# Patient Record
Sex: Female | Born: 1940
Health system: Southern US, Community
[De-identification: ages and names within clinical notes are randomized; demographics above are authoritative.]

## PROBLEM LIST (undated history)

## (undated) DIAGNOSIS — T8859XA Other complications of anesthesia, initial encounter: Secondary | ICD-10-CM

## (undated) DIAGNOSIS — R011 Cardiac murmur, unspecified: Secondary | ICD-10-CM

## (undated) DIAGNOSIS — T4145XA Adverse effect of unspecified anesthetic, initial encounter: Secondary | ICD-10-CM

## (undated) DIAGNOSIS — R112 Nausea with vomiting, unspecified: Secondary | ICD-10-CM

## (undated) DIAGNOSIS — E119 Type 2 diabetes mellitus without complications: Secondary | ICD-10-CM

## (undated) DIAGNOSIS — Z9889 Other specified postprocedural states: Secondary | ICD-10-CM

## (undated) DIAGNOSIS — M1990 Unspecified osteoarthritis, unspecified site: Secondary | ICD-10-CM

## (undated) DIAGNOSIS — I10 Essential (primary) hypertension: Secondary | ICD-10-CM

## (undated) HISTORY — DX: Cardiac murmur, unspecified: R01.1

## (undated) HISTORY — PX: REPLACEMENT TOTAL KNEE BILATERAL: SUR1225

## (undated) HISTORY — DX: Type 2 diabetes mellitus without complications: E11.9

## (undated) HISTORY — DX: Essential (primary) hypertension: I10

## (undated) HISTORY — PX: APPENDECTOMY: SHX54

## (undated) HISTORY — DX: Unspecified osteoarthritis, unspecified site: M19.90

---

## 1970-01-20 HISTORY — PX: BREAST CYST EXCISION: SHX579

## 1970-01-20 HISTORY — PX: ABDOMINAL HYSTERECTOMY: SHX81

## 1990-10-19 DIAGNOSIS — N6019 Diffuse cystic mastopathy of unspecified breast: Secondary | ICD-10-CM | POA: Insufficient documentation

## 1990-10-19 DIAGNOSIS — I10 Essential (primary) hypertension: Secondary | ICD-10-CM | POA: Insufficient documentation

## 1994-12-09 DIAGNOSIS — T7840XA Allergy, unspecified, initial encounter: Secondary | ICD-10-CM | POA: Insufficient documentation

## 2005-01-20 HISTORY — PX: COLONOSCOPY: SHX174

## 2005-09-18 DIAGNOSIS — E739 Lactose intolerance, unspecified: Secondary | ICD-10-CM | POA: Insufficient documentation

## 2005-10-23 ENCOUNTER — Ambulatory Visit: Payer: Self-pay | Admitting: Family Medicine

## 2005-10-28 ENCOUNTER — Ambulatory Visit: Payer: Self-pay

## 2005-11-18 ENCOUNTER — Ambulatory Visit: Payer: Self-pay | Admitting: General Surgery

## 2005-11-20 ENCOUNTER — Ambulatory Visit: Payer: Self-pay | Admitting: Family Medicine

## 2006-11-26 ENCOUNTER — Ambulatory Visit: Payer: Self-pay | Admitting: Family Medicine

## 2007-10-28 ENCOUNTER — Ambulatory Visit: Payer: Self-pay | Admitting: Family Medicine

## 2007-12-27 DIAGNOSIS — E559 Vitamin D deficiency, unspecified: Secondary | ICD-10-CM | POA: Insufficient documentation

## 2007-12-27 DIAGNOSIS — E119 Type 2 diabetes mellitus without complications: Secondary | ICD-10-CM | POA: Insufficient documentation

## 2008-10-13 ENCOUNTER — Ambulatory Visit: Payer: Self-pay | Admitting: Family Medicine

## 2009-06-09 ENCOUNTER — Emergency Department: Payer: Self-pay | Admitting: Unknown Physician Specialty

## 2009-06-25 ENCOUNTER — Ambulatory Visit: Payer: Self-pay | Admitting: Orthopedic Surgery

## 2010-05-22 LAB — HM DEXA SCAN: HM Dexa Scan: NORMAL

## 2010-05-23 ENCOUNTER — Ambulatory Visit: Payer: Self-pay | Admitting: Family Medicine

## 2011-06-25 ENCOUNTER — Ambulatory Visit: Payer: Self-pay | Admitting: Family Medicine

## 2012-09-06 ENCOUNTER — Ambulatory Visit: Payer: Self-pay | Admitting: Family Medicine

## 2013-01-20 DIAGNOSIS — E119 Type 2 diabetes mellitus without complications: Secondary | ICD-10-CM

## 2013-01-20 HISTORY — DX: Type 2 diabetes mellitus without complications: E11.9

## 2013-10-03 ENCOUNTER — Ambulatory Visit: Payer: Self-pay | Admitting: Family Medicine

## 2014-01-25 DIAGNOSIS — Z23 Encounter for immunization: Secondary | ICD-10-CM | POA: Diagnosis not present

## 2014-01-25 DIAGNOSIS — N644 Mastodynia: Secondary | ICD-10-CM | POA: Diagnosis not present

## 2014-01-31 ENCOUNTER — Ambulatory Visit (INDEPENDENT_AMBULATORY_CARE_PROVIDER_SITE_OTHER): Payer: Medicare Other | Admitting: General Surgery

## 2014-01-31 ENCOUNTER — Encounter: Payer: Self-pay | Admitting: General Surgery

## 2014-01-31 VITALS — BP 138/70 | HR 92 | Resp 14 | Ht 61.0 in | Wt 180.0 lb

## 2014-01-31 DIAGNOSIS — N644 Mastodynia: Secondary | ICD-10-CM | POA: Diagnosis not present

## 2014-01-31 NOTE — Progress Notes (Addendum)
Patient ID: Cynthia Castillo, female   DOB: Jan 02, 1941, 74 y.o.   MRN: [id number]  Chief Complaint  Patient presents with  . Breast Problem    HPI Cynthia Castillo is a 74 y.o. female.  who presents for a breast evaluation. The most recent mammogram was done on 10-03-13.  Patient does perform regular self breast checks and gets regular mammograms done.  She states that since the mammogram she has developed an intermittent dull pain in the right lateral breast since the day of the mammogram. It occurs mostly during the day. She has not been awakened from sleep with discomfort.  She states it does come and goes, usually lasting about 1-2 minutes. She has not modified her daily activities because of the pain. She has not made use of any analgesics.  Denies any trauma or injury.  The patient was concerned with the breast pain as an older sister had breast cancer in 2006.  Diagnosed with diabetes, diet controlled, last year and her FSBS's range 130-140.  HPI  Past Medical History  Diagnosis Date  . Hypertension   . Arthritis     hands  . Murmur   . Diabetes mellitus without complication 1749    Past Surgical History  Procedure Laterality Date  . Breast cyst excision Left 1972  . Abdominal hysterectomy  1972  . Colonoscopy  2007    Dr Bary Castilla    Family History  Problem Relation Age of Onset  . Stroke Mother   . Heart attack Father   . Cancer Sister 80    stomach/Cynthia Castillo  . Cancer Sister 17    breast/Cynthia Castillo  . Cancer Maternal Grandmother     unknown    Social History History  Substance Use Topics  . Smoking status: Never Smoker   . Smokeless tobacco: Never Used  . Alcohol Use: No    Allergies  Allergen Reactions  . Latex Anaphylaxis    Current Outpatient Prescriptions  Medication Sig Dispense Refill  . Multiple Vitamin (MULTIVITAMIN) capsule Take 1 capsule by mouth daily.    . Olmesartan-Amlodipine-HCTZ (TRIBENZOR) 40-10-25 MG TABS Take by mouth daily.    .  traMADol (ULTRAM) 50 MG tablet Take by mouth every 6 (six) hours as needed.     No current facility-administered medications for this visit.    Review of Systems Review of Systems  Constitutional: Negative.   Respiratory: Negative.   Cardiovascular: Negative.     Blood pressure 138/70, pulse 92, resp. rate 14, height 5\' 1"  (1.549 m), weight 180 lb (81.647 kg).  Physical Exam Physical Exam  Constitutional: She is oriented to person, place, and time.  Neck: Neck supple.  Cardiovascular: Normal rate and regular rhythm.   Murmur heard.  Systolic murmur is present with a grade of 2/6  Pulmonary/Chest: Effort normal and breath sounds normal. Right breast exhibits no inverted nipple, no mass, no nipple discharge, no skin change and no tenderness. Left breast exhibits no inverted nipple, no mass, no nipple discharge, no skin change and no tenderness.    Lymphadenopathy:    She has no cervical adenopathy.    She has no axillary adenopathy.  Neurological: She is alert and oriented to person, place, and time.  Skin: Skin is warm and dry.    Data Reviewed Screening mammograms dated 10/03/2013 were reviewed. Scant residual breast parenchyma. Marland Kitchen BI-RADS-1.  Assessment    Benign breast exam, mastalgia status post mammogram.     Plan    At this time  there is no indication for intervention. The transient nature of her discomfort would speak against the need for any anti-inflammatory therapy. My hope would be as these symptoms would resolve as time progresses.  The patient was reassured that it is rare for breast cancer to present with pain, especially one so intermittent in nature.  The patient had her last screening colonoscopy in 2007 and would be a candidate for a repeat exam neck year. She'll be contacted at the appropriate time.      Follow up in 2017 with screening colonoscopy.   PCP/Ref: Philemon Kingdom 02/01/2014, 6:56 AM

## 2014-01-31 NOTE — Patient Instructions (Signed)
The patient is aware to call back for any questions or concerns.  

## 2014-02-01 DIAGNOSIS — N644 Mastodynia: Secondary | ICD-10-CM | POA: Insufficient documentation

## 2014-04-18 DIAGNOSIS — Z0000 Encounter for general adult medical examination without abnormal findings: Secondary | ICD-10-CM | POA: Diagnosis not present

## 2014-04-18 DIAGNOSIS — Z23 Encounter for immunization: Secondary | ICD-10-CM | POA: Diagnosis not present

## 2014-05-19 DIAGNOSIS — M170 Bilateral primary osteoarthritis of knee: Secondary | ICD-10-CM | POA: Diagnosis not present

## 2014-05-23 DIAGNOSIS — E119 Type 2 diabetes mellitus without complications: Secondary | ICD-10-CM | POA: Diagnosis not present

## 2014-05-23 DIAGNOSIS — E785 Hyperlipidemia, unspecified: Secondary | ICD-10-CM | POA: Diagnosis not present

## 2014-05-23 DIAGNOSIS — I10 Essential (primary) hypertension: Secondary | ICD-10-CM | POA: Diagnosis not present

## 2014-05-23 DIAGNOSIS — M1990 Unspecified osteoarthritis, unspecified site: Secondary | ICD-10-CM | POA: Diagnosis not present

## 2014-05-23 DIAGNOSIS — R5383 Other fatigue: Secondary | ICD-10-CM | POA: Diagnosis not present

## 2014-05-23 LAB — HEMOGLOBIN A1C: HEMOGLOBIN A1C: 6.9 % — AB (ref 4.0–6.0)

## 2014-05-26 ENCOUNTER — Other Ambulatory Visit: Payer: Self-pay | Admitting: Orthopedic Surgery

## 2014-05-26 DIAGNOSIS — E780 Pure hypercholesterolemia: Secondary | ICD-10-CM | POA: Diagnosis not present

## 2014-05-26 DIAGNOSIS — I10 Essential (primary) hypertension: Secondary | ICD-10-CM | POA: Diagnosis not present

## 2014-05-26 DIAGNOSIS — M170 Bilateral primary osteoarthritis of knee: Secondary | ICD-10-CM

## 2014-05-26 LAB — BASIC METABOLIC PANEL
Creatinine: 0.9 mg/dL (ref 0.5–1.1)
Glucose: 137 mg/dL
Sodium: 142 mmol/L (ref 137–147)

## 2014-05-26 LAB — LIPID PANEL
CHOLESTEROL: 183 mg/dL (ref 0–200)
HDL: 60 mg/dL (ref 35–70)
LDL Cholesterol: 102 mg/dL
LDl/HDL Ratio: 1.7
TRIGLYCERIDES: 103 mg/dL (ref 40–160)

## 2014-05-26 LAB — HEPATIC FUNCTION PANEL
ALT: 14 U/L (ref 7–35)
AST: 16 U/L (ref 13–35)
Alkaline Phosphatase: 62 U/L (ref 25–125)

## 2014-05-26 LAB — CBC AND DIFFERENTIAL
NEUTROS ABS: 4 /uL
WBC: 6.1 10^3/mL

## 2014-05-26 LAB — TSH: TSH: 2.56 u[IU]/mL (ref 0.41–5.90)

## 2014-06-02 ENCOUNTER — Ambulatory Visit: Payer: Self-pay

## 2014-06-06 ENCOUNTER — Ambulatory Visit
Admission: RE | Admit: 2014-06-06 | Discharge: 2014-06-06 | Disposition: A | Discharge status: Home / Self Care | Payer: Medicare Other | Source: Ambulatory Visit | Attending: Orthopedic Surgery | Admitting: Orthopedic Surgery

## 2014-06-06 DIAGNOSIS — M179 Osteoarthritis of knee, unspecified: Secondary | ICD-10-CM | POA: Diagnosis not present

## 2014-06-06 DIAGNOSIS — Z01818 Encounter for other preprocedural examination: Secondary | ICD-10-CM | POA: Diagnosis not present

## 2014-06-06 DIAGNOSIS — M170 Bilateral primary osteoarthritis of knee: Secondary | ICD-10-CM | POA: Diagnosis present

## 2014-06-06 DIAGNOSIS — M1711 Unilateral primary osteoarthritis, right knee: Secondary | ICD-10-CM | POA: Diagnosis not present

## 2014-06-28 DIAGNOSIS — M170 Bilateral primary osteoarthritis of knee: Secondary | ICD-10-CM | POA: Diagnosis not present

## 2014-06-29 ENCOUNTER — Other Ambulatory Visit: Payer: Self-pay

## 2014-06-29 MED ORDER — OLMESARTAN-AMLODIPINE-HCTZ 40-10-25 MG PO TABS: 1.0000 | ORAL_TABLET | Freq: Every day | ORAL | Status: DC

## 2014-07-12 ENCOUNTER — Encounter
Admission: RE | Admit: 2014-07-12 | Discharge: 2014-07-12 | Disposition: A | Discharge status: Home / Self Care | Payer: Medicare Other | Source: Ambulatory Visit | Attending: Orthopedic Surgery | Admitting: Orthopedic Surgery

## 2014-07-12 DIAGNOSIS — E785 Hyperlipidemia, unspecified: Secondary | ICD-10-CM | POA: Insufficient documentation

## 2014-07-12 DIAGNOSIS — E119 Type 2 diabetes mellitus without complications: Secondary | ICD-10-CM | POA: Insufficient documentation

## 2014-07-12 DIAGNOSIS — E039 Hypothyroidism, unspecified: Secondary | ICD-10-CM | POA: Diagnosis not present

## 2014-07-12 DIAGNOSIS — Z91040 Latex allergy status: Secondary | ICD-10-CM | POA: Diagnosis not present

## 2014-07-12 DIAGNOSIS — E1140 Type 2 diabetes mellitus with diabetic neuropathy, unspecified: Secondary | ICD-10-CM | POA: Diagnosis not present

## 2014-07-12 DIAGNOSIS — Z01810 Encounter for preprocedural cardiovascular examination: Secondary | ICD-10-CM | POA: Diagnosis present

## 2014-07-12 DIAGNOSIS — I10 Essential (primary) hypertension: Secondary | ICD-10-CM | POA: Insufficient documentation

## 2014-07-12 DIAGNOSIS — M1711 Unilateral primary osteoarthritis, right knee: Secondary | ICD-10-CM | POA: Diagnosis present

## 2014-07-12 DIAGNOSIS — Z01812 Encounter for preprocedural laboratory examination: Secondary | ICD-10-CM | POA: Diagnosis present

## 2014-07-12 HISTORY — DX: Nausea with vomiting, unspecified: R11.2

## 2014-07-12 HISTORY — DX: Adverse effect of unspecified anesthetic, initial encounter: T41.45XA

## 2014-07-12 HISTORY — DX: Other specified postprocedural states: Z98.890

## 2014-07-12 HISTORY — DX: Other complications of anesthesia, initial encounter: T88.59XA

## 2014-07-12 LAB — TYPE AND SCREEN
ABO/RH(D): A POS
Antibody Screen: NEGATIVE

## 2014-07-12 LAB — CBC
HCT: 40.2 % (ref 35.0–47.0)
Hemoglobin: 12.8 g/dL (ref 12.0–16.0)
MCH: 25.2 pg — AB (ref 26.0–34.0)
MCHC: 31.7 g/dL — AB (ref 32.0–36.0)
MCV: 79.6 fL — ABNORMAL LOW (ref 80.0–100.0)
Platelets: 382 10*3/uL (ref 150–440)
RBC: 5.05 MIL/uL (ref 3.80–5.20)
RDW: 14.3 % (ref 11.5–14.5)
WBC: 6.4 10*3/uL (ref 3.6–11.0)

## 2014-07-12 LAB — BASIC METABOLIC PANEL
ANION GAP: 9 (ref 5–15)
BUN: 12 mg/dL (ref 6–20)
CO2: 27 mmol/L (ref 22–32)
Calcium: 10 mg/dL (ref 8.9–10.3)
Chloride: 105 mmol/L (ref 101–111)
Creatinine, Ser: 0.93 mg/dL (ref 0.44–1.00)
GFR calc Af Amer: >60 mL/min (ref >60)
GFR calc non Af Amer: 60 mL/min — ABNORMAL LOW (ref >60)
Glucose, Bld: 132 mg/dL — ABNORMAL HIGH (ref 65–99)
Potassium: 3.5 mmol/L (ref 3.5–5.1)
Sodium: 141 mmol/L (ref 135–145)

## 2014-07-12 LAB — URINALYSIS COMPLETE WITH MICROSCOPIC (ARMC ONLY)
Bilirubin Urine: NEGATIVE
Glucose, UA: NEGATIVE mg/dL
HGB URINE DIPSTICK: NEGATIVE
Ketones, ur: NEGATIVE mg/dL
Nitrite: NEGATIVE
PH: 5 (ref 5.0–8.0)
PROTEIN: NEGATIVE mg/dL
Specific Gravity, Urine: 1.013 (ref 1.005–1.030)

## 2014-07-12 LAB — SURGICAL PCR SCREEN
MRSA, PCR: NEGATIVE
STAPHYLOCOCCUS AUREUS: NEGATIVE

## 2014-07-12 LAB — PROTIME-INR
INR: 0.96
PROTHROMBIN TIME: 13 s (ref 11.4–15.0)

## 2014-07-12 LAB — APTT: aPTT: 29 seconds (ref 24–36)

## 2014-07-12 LAB — SEDIMENTATION RATE: SED RATE: 24 mm/h (ref 0–30)

## 2014-07-12 LAB — ABO/RH: ABO/RH(D): A POS

## 2014-07-12 NOTE — Patient Instructions (Signed)
  Your procedure is scheduled on:07/25/14 Report to Day Surgery. To find out your arrival time please call [phone] between 1PM - 3PM on 07/22/14.  Remember: Instructions that are not followed completely may result in serious medical risk, up to and including death, or upon the discretion of your surgeon and anesthesiologist your surgery may need to be rescheduled.    __x__ 1. Do not eat food or drink liquids after midnight. No gum chewing or hard candies.     __x__ 2. No Alcohol for 24 hours before or after surgery.   ____ 3. Bring all medications with you on the day of surgery if instructed.    ___x_ 4. Notify your doctor if there is any change in your medical condition     (cold, fever, infections).     Do not wear jewelry, make-up, hairpins, clips or nail polish.  Do not wear lotions, powders, or perfumes. You may wear deodorant.  Do not shave 48 hours prior to surgery. Men may shave face and neck.  Do not bring valuables to the hospital.    [facility] is not responsible for any belongings or valuables.               Contacts, dentures or bridgework may not be worn into surgery.  Leave your suitcase in the car. After surgery it may be brought to your room.  For patients admitted to the hospital, discharge time is determined by your                treatment team.   Patients discharged the day of surgery will not be allowed to drive home.   Please read over the following fact sheets that you were given:   Surgical Site Infection Prevention   ____ Take these medicines the morning of surgery with A SIP OF WATER:    1.   2.   3.   4.  5.  6.  ____ Fleet Enema (as directed)   _x___ Use CHG Soap as directed  ____ Use inhalers on the day of surgery  ____ Stop metformin 2 days prior to surgery    ____ Take 1/2 of usual insulin dose the night before surgery and none on the morning of surgery.   ____ Stop Coumadin/Plavix/aspirin on  ____ Stop Anti-inflammatories on     ____ Stop supplements until after surgery.    ____ Bring C-Pap to the hospital.

## 2014-07-25 ENCOUNTER — Inpatient Hospital Stay
Admission: RE | Admit: 2014-07-25 | Discharge: 2014-07-28 | DRG: 470 | Disposition: A | Discharge status: Skilled Nursing Facility | Payer: Medicare Other | Source: Ambulatory Visit | Attending: Orthopedic Surgery | Admitting: Orthopedic Surgery

## 2014-07-25 ENCOUNTER — Encounter
Admission: RE | Disposition: A | Discharge status: Skilled Nursing Facility | Payer: Self-pay | Source: Ambulatory Visit | Attending: Orthopedic Surgery

## 2014-07-25 ENCOUNTER — Inpatient Hospital Stay: Payer: Medicare Other | Admitting: Anesthesiology

## 2014-07-25 ENCOUNTER — Encounter: Payer: Self-pay | Admitting: *Deleted

## 2014-07-25 ENCOUNTER — Inpatient Hospital Stay: Payer: Medicare Other

## 2014-07-25 DIAGNOSIS — M158 Other polyosteoarthritis: Secondary | ICD-10-CM | POA: Diagnosis not present

## 2014-07-25 DIAGNOSIS — Z6834 Body mass index (BMI) 34.0-34.9, adult: Secondary | ICD-10-CM

## 2014-07-25 DIAGNOSIS — E559 Vitamin D deficiency, unspecified: Secondary | ICD-10-CM | POA: Diagnosis not present

## 2014-07-25 DIAGNOSIS — M6281 Muscle weakness (generalized): Secondary | ICD-10-CM | POA: Diagnosis not present

## 2014-07-25 DIAGNOSIS — R262 Difficulty in walking, not elsewhere classified: Secondary | ICD-10-CM | POA: Diagnosis not present

## 2014-07-25 DIAGNOSIS — R6889 Other general symptoms and signs: Secondary | ICD-10-CM | POA: Diagnosis not present

## 2014-07-25 DIAGNOSIS — Z96651 Presence of right artificial knee joint: Secondary | ICD-10-CM | POA: Diagnosis not present

## 2014-07-25 DIAGNOSIS — M1711 Unilateral primary osteoarthritis, right knee: Secondary | ICD-10-CM | POA: Diagnosis not present

## 2014-07-25 DIAGNOSIS — D62 Acute posthemorrhagic anemia: Secondary | ICD-10-CM | POA: Diagnosis not present

## 2014-07-25 DIAGNOSIS — I10 Essential (primary) hypertension: Secondary | ICD-10-CM | POA: Diagnosis present

## 2014-07-25 DIAGNOSIS — M179 Osteoarthritis of knee, unspecified: Secondary | ICD-10-CM | POA: Diagnosis not present

## 2014-07-25 DIAGNOSIS — E119 Type 2 diabetes mellitus without complications: Secondary | ICD-10-CM | POA: Diagnosis present

## 2014-07-25 DIAGNOSIS — E785 Hyperlipidemia, unspecified: Secondary | ICD-10-CM | POA: Diagnosis not present

## 2014-07-25 DIAGNOSIS — E039 Hypothyroidism, unspecified: Secondary | ICD-10-CM | POA: Diagnosis not present

## 2014-07-25 DIAGNOSIS — G8918 Other acute postprocedural pain: Secondary | ICD-10-CM

## 2014-07-25 DIAGNOSIS — M1710 Unilateral primary osteoarthritis, unspecified knee: Principal | ICD-10-CM | POA: Diagnosis present

## 2014-07-25 DIAGNOSIS — J3089 Other allergic rhinitis: Secondary | ICD-10-CM | POA: Diagnosis not present

## 2014-07-25 DIAGNOSIS — E1143 Type 2 diabetes mellitus with diabetic autonomic (poly)neuropathy: Secondary | ICD-10-CM | POA: Diagnosis not present

## 2014-07-25 DIAGNOSIS — M1990 Unspecified osteoarthritis, unspecified site: Secondary | ICD-10-CM | POA: Diagnosis not present

## 2014-07-25 HISTORY — PX: TOTAL KNEE ARTHROPLASTY: SHX125

## 2014-07-25 LAB — CBC
HCT: 35.5 % (ref 35.0–47.0)
Hemoglobin: 11.3 g/dL — ABNORMAL LOW (ref 12.0–16.0)
MCH: 25.2 pg — ABNORMAL LOW (ref 26.0–34.0)
MCHC: 31.8 g/dL — AB (ref 32.0–36.0)
MCV: 79 fL — AB (ref 80.0–100.0)
Platelets: 336 10*3/uL (ref 150–440)
RBC: 4.49 MIL/uL (ref 3.80–5.20)
RDW: 14 % (ref 11.5–14.5)
WBC: 9.2 10*3/uL (ref 3.6–11.0)

## 2014-07-25 LAB — GLUCOSE, CAPILLARY
GLUCOSE-CAPILLARY: 209 mg/dL — AB (ref 65–99)
Glucose-Capillary: 156 mg/dL — ABNORMAL HIGH (ref 65–99)

## 2014-07-25 LAB — CREATININE, SERUM
Creatinine, Ser: 0.89 mg/dL (ref 0.44–1.00)
GFR calc Af Amer: >60 mL/min (ref >60)
GFR calc non Af Amer: >60 mL/min (ref >60)

## 2014-07-25 SURGERY — ARTHROPLASTY, KNEE, TOTAL
Anesthesia: Spinal | Laterality: Right | Wound class: Clean

## 2014-07-25 MED ORDER — BISACODYL 10 MG RE SUPP
10.0000 mg | Freq: Every day | RECTAL | Status: DC | PRN
Start: 2014-07-25 — End: 2014-07-28
  Filled 2014-07-25: qty 1

## 2014-07-25 MED ORDER — CHLORHEXIDINE GLUCONATE 4 % EX LIQD: 60.0000 mL | Freq: Once | CUTANEOUS | Status: DC

## 2014-07-25 MED ORDER — AMLODIPINE BESYLATE 10 MG PO TABS
10.0000 mg | ORAL_TABLET | Freq: Every day | ORAL | Status: DC
  Administered 2014-07-25 – 2014-07-28 (×4): 10 mg via ORAL
  Filled 2014-07-25 (×4): qty 1

## 2014-07-25 MED ORDER — MULTIVITAMINS PO CAPS: 1.0000 | ORAL_CAPSULE | Freq: Every day | ORAL | Status: DC

## 2014-07-25 MED ORDER — CEFAZOLIN SODIUM-DEXTROSE 2-3 GM-% IV SOLR
2.0000 g | Freq: Four times a day (QID) | INTRAVENOUS | Status: AC
  Administered 2014-07-25 (×3): 2 g via INTRAVENOUS
  Filled 2014-07-25 (×3): qty 50

## 2014-07-25 MED ORDER — FENTANYL CITRATE (PF) 100 MCG/2ML IJ SOLN
INTRAMUSCULAR | Status: DC | PRN
  Administered 2014-07-25: .5 ug via INTRAVENOUS

## 2014-07-25 MED ORDER — METOCLOPRAMIDE HCL 5 MG/ML IJ SOLN: 5.0000 mg | Freq: Three times a day (TID) | INTRAMUSCULAR | Status: DC | PRN

## 2014-07-25 MED ORDER — SODIUM CHLORIDE 0.9 % IV SOLN
INTRAVENOUS | Status: DC | PRN
  Administered 2014-07-25: 60 mL

## 2014-07-25 MED ORDER — NEOMYCIN-POLYMYXIN B GU 40-200000 IR SOLN
Status: AC
  Filled 2014-07-25: qty 20

## 2014-07-25 MED ORDER — BUPIVACAINE-EPINEPHRINE (PF) 0.25% -1:200000 IJ SOLN
INTRAMUSCULAR | Status: DC | PRN
Start: 2014-07-25 — End: 2014-07-25
  Administered 2014-07-25: 30 mL

## 2014-07-25 MED ORDER — PROPOFOL 10 MG/ML IV BOLUS
INTRAVENOUS | Status: DC | PRN
  Administered 2014-07-25: 40 mg via INTRAVENOUS
  Administered 2014-07-25: 10 mg via INTRAVENOUS

## 2014-07-25 MED ORDER — METOCLOPRAMIDE HCL 10 MG PO TABS: 5.0000 mg | ORAL_TABLET | Freq: Three times a day (TID) | ORAL | Status: DC | PRN

## 2014-07-25 MED ORDER — MORPHINE SULFATE 10 MG/ML IJ SOLN
INTRAMUSCULAR | Status: AC
  Filled 2014-07-25: qty 1

## 2014-07-25 MED ORDER — HYDROCHLOROTHIAZIDE 25 MG PO TABS
25.0000 mg | ORAL_TABLET | Freq: Every day | ORAL | Status: DC
  Administered 2014-07-26 – 2014-07-28 (×3): 25 mg via ORAL
  Filled 2014-07-25 (×3): qty 1

## 2014-07-25 MED ORDER — CEFAZOLIN SODIUM-DEXTROSE 2-3 GM-% IV SOLR
2.0000 g | INTRAVENOUS | Status: AC
  Administered 2014-07-25: 2 g via INTRAVENOUS

## 2014-07-25 MED ORDER — PROPOFOL INFUSION 10 MG/ML OPTIME
INTRAVENOUS | Status: DC | PRN
  Administered 2014-07-25: 65 ug/kg/min via INTRAVENOUS

## 2014-07-25 MED ORDER — ONDANSETRON HCL 4 MG/2ML IJ SOLN
4.0000 mg | Freq: Four times a day (QID) | INTRAMUSCULAR | Status: DC | PRN
  Administered 2014-07-26: 4 mg via INTRAVENOUS
  Filled 2014-07-25: qty 2

## 2014-07-25 MED ORDER — ONDANSETRON HCL 4 MG PO TABS: 4.0000 mg | ORAL_TABLET | Freq: Four times a day (QID) | ORAL | Status: DC | PRN

## 2014-07-25 MED ORDER — NEOMYCIN-POLYMYXIN B GU 40-200000 IR SOLN
Status: DC | PRN
  Administered 2014-07-25: 16 mL

## 2014-07-25 MED ORDER — BUPIVACAINE-EPINEPHRINE (PF) 0.25% -1:200000 IJ SOLN
INTRAMUSCULAR | Status: AC
  Filled 2014-07-25: qty 30

## 2014-07-25 MED ORDER — OLMESARTAN-AMLODIPINE-HCTZ 40-10-25 MG PO TABS: 1.0000 | ORAL_TABLET | Freq: Every day | ORAL | Status: DC

## 2014-07-25 MED ORDER — ENOXAPARIN SODIUM 30 MG/0.3ML ~~LOC~~ SOLN
30.0000 mg | Freq: Two times a day (BID) | SUBCUTANEOUS | Status: DC
  Administered 2014-07-26 – 2014-07-28 (×5): 30 mg via SUBCUTANEOUS
  Filled 2014-07-25 (×5): qty 0.3

## 2014-07-25 MED ORDER — ADULT MULTIVITAMIN W/MINERALS CH
1.0000 | ORAL_TABLET | Freq: Every day | ORAL | Status: DC
  Administered 2014-07-26 – 2014-07-28 (×3): 1 via ORAL
  Filled 2014-07-25 (×3): qty 1

## 2014-07-25 MED ORDER — PHENOL 1.4 % MT LIQD: 1.0000 | OROMUCOSAL | Status: DC | PRN

## 2014-07-25 MED ORDER — OXYCODONE HCL 5 MG PO TABS
5.0000 mg | ORAL_TABLET | ORAL | Status: DC | PRN
  Administered 2014-07-25 (×2): 5 mg via ORAL
  Administered 2014-07-26 (×2): 10 mg via ORAL
  Administered 2014-07-26 – 2014-07-28 (×6): 5 mg via ORAL
  Filled 2014-07-25 (×4): qty 1
  Filled 2014-07-25: qty 2
  Filled 2014-07-25 (×2): qty 1
  Filled 2014-07-25: qty 2
  Filled 2014-07-25 (×2): qty 1

## 2014-07-25 MED ORDER — ONDANSETRON HCL 4 MG/2ML IJ SOLN
INTRAMUSCULAR | Status: DC | PRN
  Administered 2014-07-25 (×2): 4 mg via INTRAVENOUS

## 2014-07-25 MED ORDER — CEFAZOLIN SODIUM-DEXTROSE 2-3 GM-% IV SOLR
INTRAVENOUS | Status: AC
  Filled 2014-07-25: qty 50

## 2014-07-25 MED ORDER — SODIUM CHLORIDE 0.9 % IJ SOLN
INTRAMUSCULAR | Status: AC
  Filled 2014-07-25: qty 100

## 2014-07-25 MED ORDER — MORPHINE (PF) INJECTION FOR INHALATION 10 MG/ML
RESPIRATORY_TRACT | Status: DC | PRN
  Administered 2014-07-25: 10 mg via RESPIRATORY_TRACT

## 2014-07-25 MED ORDER — BUPIVACAINE LIPOSOME 1.3 % IJ SUSP
INTRAMUSCULAR | Status: AC
  Filled 2014-07-25: qty 20

## 2014-07-25 MED ORDER — PHENYLEPHRINE HCL 10 MG/ML IJ SOLN
INTRAMUSCULAR | Status: DC | PRN
  Administered 2014-07-25 (×2): 100 ug via INTRAVENOUS

## 2014-07-25 MED ORDER — MENTHOL 3 MG MT LOZG: 1.0000 | LOZENGE | OROMUCOSAL | Status: DC | PRN

## 2014-07-25 MED ORDER — ATROPINE ORAL SOLUTION 0.08 MG/ML: 0.0100 mg/kg | Freq: Once | ORAL | Status: DC | PRN

## 2014-07-25 MED ORDER — ALUM & MAG HYDROXIDE-SIMETH 200-200-20 MG/5ML PO SUSP
30.0000 mL | ORAL | Status: DC | PRN
Start: 2014-07-25 — End: 2014-07-28

## 2014-07-25 MED ORDER — MAGNESIUM CITRATE PO SOLN
1.0000 | Freq: Once | ORAL | Status: AC | PRN
  Filled 2014-07-25: qty 296

## 2014-07-25 MED ORDER — BUPIVACAINE HCL (PF) 0.5 % IJ SOLN
INTRAMUSCULAR | Status: DC | PRN
  Administered 2014-07-25: 3 mL

## 2014-07-25 MED ORDER — MORPHINE SULFATE 2 MG/ML IJ SOLN
2.0000 mg | INTRAMUSCULAR | Status: DC | PRN
  Administered 2014-07-25: 2 mg via INTRAVENOUS
  Filled 2014-07-25: qty 1

## 2014-07-25 MED ORDER — SODIUM CHLORIDE 0.9 % IV SOLN
INTRAVENOUS | Status: DC
  Administered 2014-07-25 (×3): via INTRAVENOUS

## 2014-07-25 MED ORDER — MIDAZOLAM HCL 5 MG/5ML IJ SOLN
INTRAMUSCULAR | Status: DC | PRN
  Administered 2014-07-25: 2 mg via INTRAVENOUS

## 2014-07-25 MED ORDER — ACETAMINOPHEN 650 MG RE SUPP: 650.0000 mg | Freq: Four times a day (QID) | RECTAL | Status: DC | PRN

## 2014-07-25 MED ORDER — ONDANSETRON HCL 4 MG/2ML IJ SOLN: 4.0000 mg | Freq: Once | INTRAMUSCULAR | Status: DC | PRN

## 2014-07-25 MED ORDER — SODIUM CHLORIDE 0.9 % IV SOLN
INTRAVENOUS | Status: DC
  Administered 2014-07-25 – 2014-07-26 (×2): via INTRAVENOUS

## 2014-07-25 MED ORDER — METHOCARBAMOL 1000 MG/10ML IJ SOLN: 500.0000 mg | Freq: Four times a day (QID) | INTRAVENOUS | Status: DC | PRN

## 2014-07-25 MED ORDER — TRANEXAMIC ACID 1000 MG/10ML IV SOLN
1000.0000 mg | INTRAVENOUS | Status: AC
  Administered 2014-07-25: 1000 mg via INTRAVENOUS
  Filled 2014-07-25: qty 10

## 2014-07-25 MED ORDER — FENTANYL CITRATE (PF) 100 MCG/2ML IJ SOLN: 25.0000 ug | INTRAMUSCULAR | Status: DC | PRN

## 2014-07-25 MED ORDER — METHOCARBAMOL 500 MG PO TABS
500.0000 mg | ORAL_TABLET | Freq: Four times a day (QID) | ORAL | Status: DC | PRN
Start: 2014-07-25 — End: 2014-07-28

## 2014-07-25 MED ORDER — ACETAMINOPHEN 325 MG PO TABS: 650.0000 mg | ORAL_TABLET | Freq: Four times a day (QID) | ORAL | Status: DC | PRN

## 2014-07-25 MED ORDER — DIPHENHYDRAMINE HCL 12.5 MG/5ML PO ELIX: 12.5000 mg | ORAL_SOLUTION | ORAL | Status: DC | PRN

## 2014-07-25 MED ORDER — MAGNESIUM HYDROXIDE 400 MG/5ML PO SUSP
30.0000 mL | Freq: Every day | ORAL | Status: DC | PRN
  Administered 2014-07-26 – 2014-07-27 (×2): 30 mL via ORAL
  Filled 2014-07-25 (×2): qty 30

## 2014-07-25 MED ORDER — ZOLPIDEM TARTRATE 5 MG PO TABS: 5.0000 mg | ORAL_TABLET | Freq: Every evening | ORAL | Status: DC | PRN

## 2014-07-25 MED ORDER — DOCUSATE SODIUM 100 MG PO CAPS
100.0000 mg | ORAL_CAPSULE | Freq: Two times a day (BID) | ORAL | Status: DC
  Administered 2014-07-25 – 2014-07-28 (×6): 100 mg via ORAL
  Filled 2014-07-25 (×8): qty 1

## 2014-07-25 MED ORDER — IRBESARTAN 150 MG PO TABS
300.0000 mg | ORAL_TABLET | Freq: Every day | ORAL | Status: DC
  Administered 2014-07-25 – 2014-07-28 (×4): 300 mg via ORAL
  Filled 2014-07-25 (×4): qty 2

## 2014-07-25 SURGICAL SUPPLY — 51 items
BANDAGE ELASTIC 6 CLIP ST LF (GAUZE/BANDAGES/DRESSINGS) ×2 IMPLANT
BLADE SAW 1 (BLADE) ×2 IMPLANT
BLOCK CUTTING FEMUR 2 RT MED (MISCELLANEOUS) IMPLANT
BLOCK CUTTING TIBIAL 2 RT (MISCELLANEOUS) IMPLANT
CANISTER SUCT 1200ML W/VALVE (MISCELLANEOUS) ×2 IMPLANT
CANISTER SUCT 3000ML (MISCELLANEOUS) ×4 IMPLANT
CAPT KNEE TOTAL 3 ×2 IMPLANT
CATH FOL LEG HOLDER (MISCELLANEOUS) ×2 IMPLANT
CATH TRAY 16F METER LATEX (MISCELLANEOUS) ×2 IMPLANT
CEMENT HV SMART SET (Cement) ×4 IMPLANT
CHLORAPREP W/TINT 26ML (MISCELLANEOUS) ×2 IMPLANT
COOLER POLAR GLACIER W/PUMP (MISCELLANEOUS) ×2 IMPLANT
DRAPE INCISE IOBAN 66X45 STRL (DRAPES) ×4 IMPLANT
DRAPE SHEET LG 3/4 BI-LAMINATE (DRAPES) ×4 IMPLANT
ELECT CAUTERY BLADE 6.4 (BLADE) ×2 IMPLANT
GAUZE PETRO XEROFOAM 1X8 (MISCELLANEOUS) ×2 IMPLANT
GAUZE SPONGE 4X4 12PLY STRL (GAUZE/BANDAGES/DRESSINGS) ×2 IMPLANT
GLOVE BIOGEL PI IND STRL 9 (GLOVE) ×1 IMPLANT
GLOVE BIOGEL PI INDICATOR 9 (GLOVE) ×1
GLOVE SURG ORTHO 9.0 STRL STRW (GLOVE) ×2 IMPLANT
GOWN SPECIALTY ULTRA XL (MISCELLANEOUS) ×2 IMPLANT
GOWN STRL REUS W/ TWL LRG LVL3 (GOWN DISPOSABLE) ×2 IMPLANT
GOWN STRL REUS W/TWL LRG LVL3 (GOWN DISPOSABLE) ×2
HANDPIECE SUCTION TUBG SURGILV (MISCELLANEOUS) ×2 IMPLANT
HOOD PEEL AWAY FACE SHEILD DIS (HOOD) ×4 IMPLANT
IMMBOLIZER KNEE 19 BLUE UNIV (SOFTGOODS) ×2 IMPLANT
IV SET EXTENSION 6 LL TADAPT (SET/KITS/TRAYS/PACK) ×2 IMPLANT
KNEE MEDACTA TIBIAL/FEMORAL BL (Knees) ×2 IMPLANT
KNIFE SCULPS 14X20 (INSTRUMENTS) ×2 IMPLANT
NDL SAFETY 18GX1.5 (NEEDLE) ×2 IMPLANT
NEEDLE SPNL 18GX3.5 QUINCKE PK (NEEDLE) ×2 IMPLANT
NEEDLE SPNL 20GX3.5 QUINCKE YW (NEEDLE) ×2 IMPLANT
NS IRRIG 1000ML POUR BTL (IV SOLUTION) ×2 IMPLANT
PACK TOTAL KNEE (MISCELLANEOUS) ×2 IMPLANT
PAD GROUND ADULT SPLIT (MISCELLANEOUS) ×2 IMPLANT
PAD WRAPON POLAR KNEE (MISCELLANEOUS) ×1 IMPLANT
SOL .9 NS 3000ML IRR  AL (IV SOLUTION) ×1
SOL .9 NS 3000ML IRR UROMATIC (IV SOLUTION) ×1 IMPLANT
STAPLER SKIN PROX 35W (STAPLE) ×2 IMPLANT
STEM EXTENSION 11MMX30MM (Stem) ×2 IMPLANT
STRAP SAFETY BODY (MISCELLANEOUS) ×2 IMPLANT
SUCTION FRAZIER TIP 10 FR DISP (SUCTIONS) ×2 IMPLANT
SUT DVC 2 QUILL PDO  T11 36X36 (SUTURE) ×1
SUT DVC 2 QUILL PDO T11 36X36 (SUTURE) ×1 IMPLANT
SUT DVC QUILL MONODERM 30X30 (SUTURE) ×2 IMPLANT
SUT ETHIBOND NAB CT1 #1 30IN (SUTURE) ×2 IMPLANT
SYR 20CC LL (SYRINGE) ×2 IMPLANT
SYR 50ML LL SCALE MARK (SYRINGE) ×2 IMPLANT
TOWER CARTRIDGE SMART MIX (DISPOSABLE) ×2 IMPLANT
WATER STERILE IRR 1000ML POUR (IV SOLUTION) IMPLANT
WRAPON POLAR PAD KNEE (MISCELLANEOUS) ×2

## 2014-07-25 NOTE — Anesthesia Procedure Notes (Signed)
Spinal Patient location during procedure: OR Staffing Performed by: anesthesiologist  Preanesthetic Checklist Completed: patient identified, site marked, surgical consent, pre-op evaluation, timeout performed, IV checked, risks and benefits discussed and monitors and equipment checked Spinal Block Patient position: sitting Prep: Betadine Patient monitoring: heart rate, continuous pulse ox, blood pressure and cardiac monitor Approach: midline Location: L4-5 Injection technique: single-shot Needle Needle type: Whitacre and Introducer  Needle gauge: 24 G Needle length: 9 cm Assessment Sensory level: T6 Additional Notes Negative paresthesia. Negative blood return. Positive free-flowing CSF. Expiration date of kit checked and confirmed. Patient tolerated procedure well, without complications.

## 2014-07-25 NOTE — Op Note (Signed)
07/25/2014  9:42 AM  PATIENT:  Cynthia Castillo  74 y.o. female  PRE-OPERATIVE DIAGNOSIS:  DEGENERATIVE Osteoarthritis  POST-OPERATIVE DIAGNOSIS:  DEGENERATIVE Osteoarthritis  PROCEDURE:  Procedure(s): TOTAL KNEE ARTHROPLASTY (Right)  SURGEON: Laurene Footman, MD  ASSISTANTS: Rachelle Hora [facility]  ANESTHESIA:   spinal  EBL:  Total I/O In: 1200 [I.V.:1200] Out: 125 [Urine:100; Blood:25]  BLOOD ADMINISTERED:none  DRAINS: none   LOCAL MEDICATIONS USED:  MARCAINE    and OTHER morphine, exparel  SPECIMEN:  Source of Specimen:  Cut ends of bone  DISPOSITION OF SPECIMEN:  PATHOLOGY  COUNTS:  YES  TOURNIQUET:   70 minutes at 300 mmHg  IMPLANTS: Medacta GMK sphere 2 femur right, 2 tibia with 11 stem, 10 mm flex insert, to patella all components cemented  DICTATION: .Dragon Dictation patient brought the operating room and after adequate anesthesia was obtained, right leg was prepped and draped in the sterile fashion. Appropriate patient identification timeout procedures were completed and the tourniquet was raised to 3 mmHg. A midline skin incision was made with the knee in flexion followed by medial parapatellar arthrotomy. There is extensive synovitis in the knee with pitting of cartilage particularly the lateral femoral condyle anterior cruciate ligament and PCL were excised along the fat pad. Medial structures were elevated and the medial my knee cutting block applied. Approximately tibia cut was carried out and the tibia bone cut matched template. Femur was approached a similar fashion with the distal femoral cut carried out followed by application of the 2 4-in-1 cutting guide. Anterior posterior chamfer cuts carried out. Size 2 tibia was placed and with the appropriate rotation and pinned in place audible drilling carried out and keel punch placed next the 2 femur was placed and with a 10 mm insert gave excellent range of motion and stability. The distal femoral drill holes were made and  the femoral trochlear groove cut was made with a router. These trial components were removed and the patella was cut using the patellar cutting guide and sized to size 2 after 3 drill holes were made. The knee was then infiltrated with Exparel and a combination of morphine and Sensorcaine with epinephrine. Bony surfaces were then thoroughly irrigated and dried. Components were cemented into place first tibial component first followed by the tibial insert with set screw inserted with torque limiting screwdriver. The femoral component was placed in the is held in extension while the patellar button was clamped into place. After the cement set the knee was irrigated with Betadine solution followed by pulse lavage and closed with a heavy Quill for the capsule. To a Quill subcutaneously staples Xeroform 4 x 4's ABDs and web roll and Polar Care patient sent to recovery in stable condition.  PLAN OF CARE: Admit to inpatient   PATIENT DISPOSITION:  PACU - hemodynamically stable.

## 2014-07-25 NOTE — Progress Notes (Signed)
   07/25/14 1600  Clinical Encounter Type  Visited With Patient and family together  Visit Type Follow-up  Referral From Nurse  Consult/Referral To Chaplain  Spiritual Encounters  Spiritual Needs Brochure  Stress Factors  Patient Stress Factors None identified  Family Stress Factors None identified   Faith tradition: [employer] Status: Postsurgery Family: 2 daughters, gentleman, daughter n law (bedside); Visit Assessment: Chaplain paged to a followup visit for Adv Directive education; The patient has the packet, she said that she would like to look it over with her family after she heals from surgery today;  Chaplains and pastoral care can be reached via online request and pager [phone]

## 2014-07-25 NOTE — Transfer of Care (Signed)
Immediate Anesthesia Transfer of Care Note  Patient: Cynthia Castillo  Procedure(s) Performed: Procedure(s): TOTAL KNEE ARTHROPLASTY (Right)  Patient Location: PACU  Anesthesia Type:Spinal  Level of Consciousness: sedated  Airway & Oxygen Therapy: Patient connected to nasal cannula oxygen  Post-op Assessment: Report given to RN  Post vital signs: Reviewed and stable  Last Vitals:  Filed Vitals:   07/25/14 0602  BP: 192/66  Pulse: 105  Temp: 36.8 C  Resp: 16    Complications: No apparent anesthesia complications

## 2014-07-25 NOTE — H&P (Signed)
Reviewed paper H+P, will be scanned into chart. No changes noted.  

## 2014-07-25 NOTE — Plan of Care (Signed)
Problem: Consults Goal: Diagnosis- Total Joint Replacement Outcome: Progressing Right total knee replacement

## 2014-07-25 NOTE — Anesthesia Preprocedure Evaluation (Signed)
Anesthesia Evaluation  Patient identified by MRN, date of birth, ID band Patient awake    Reviewed: Allergy & Precautions, H&P , NPO status , Patient's Chart, lab work & pertinent test results, reviewed documented beta blocker date and time   History of Anesthesia Complications (+) PROLONGED EMERGENCE and history of anesthetic complications  Airway Mallampati: II  TM Distance: >3 FB Neck ROM: full    Dental no notable dental hx. (+) Teeth Intact   Pulmonary neg pulmonary ROS,  breath sounds clear to auscultation  Pulmonary exam normal       Cardiovascular Exercise Tolerance: Good hypertension, negative cardio ROS  + Valvular Problems/Murmurs MVP Rhythm:regular Rate:Normal     Neuro/Psych negative neurological ROS  negative psych ROS   GI/Hepatic negative GI ROS, Neg liver ROS,   Endo/Other  negative endocrine ROSdiabetes, Well Controlled, Type 2  Renal/GU negative Renal ROS  negative genitourinary   Musculoskeletal   Abdominal   Peds  Hematology negative hematology ROS (+)   Anesthesia Other Findings   Reproductive/Obstetrics negative OB ROS                             Anesthesia Physical Anesthesia Plan  ASA: II  Anesthesia Plan: Spinal   Post-op Pain Management:    Induction:   Airway Management Planned:   Additional Equipment:   Intra-op Plan:   Post-operative Plan:   Informed Consent: I have reviewed the patients History and Physical, chart, labs and discussed the procedure including the risks, benefits and alternatives for the proposed anesthesia with the patient or authorized representative who has indicated his/her understanding and acceptance.   Dental Advisory Given  Plan Discussed with: CRNA  Anesthesia Plan Comments:         Anesthesia Quick Evaluation

## 2014-07-26 LAB — BASIC METABOLIC PANEL
Anion gap: 7 (ref 5–15)
BUN: 11 mg/dL (ref 6–20)
CALCIUM: 8.9 mg/dL (ref 8.9–10.3)
CO2: 23 mmol/L (ref 22–32)
Chloride: 109 mmol/L (ref 101–111)
Creatinine, Ser: 0.91 mg/dL (ref 0.44–1.00)
GFR calc Af Amer: >60 mL/min (ref >60)
Glucose, Bld: 212 mg/dL — ABNORMAL HIGH (ref 65–99)
POTASSIUM: 3.5 mmol/L (ref 3.5–5.1)
Sodium: 139 mmol/L (ref 135–145)

## 2014-07-26 LAB — CBC
HEMATOCRIT: 33.9 % — AB (ref 35.0–47.0)
HEMOGLOBIN: 10.8 g/dL — AB (ref 12.0–16.0)
MCH: 25.1 pg — AB (ref 26.0–34.0)
MCHC: 31.7 g/dL — ABNORMAL LOW (ref 32.0–36.0)
MCV: 79.2 fL — AB (ref 80.0–100.0)
Platelets: 374 10*3/uL (ref 150–440)
RBC: 4.28 MIL/uL (ref 3.80–5.20)
RDW: 13.9 % (ref 11.5–14.5)
WBC: 15.6 10*3/uL — ABNORMAL HIGH (ref 3.6–11.0)

## 2014-07-26 NOTE — Progress Notes (Signed)
Physical Therapy Treatment Patient Details Name: Cynthia Castillo MRN: [id number] DOB: 05-10-1940 Today's Date: 07/26/2014    History of Present Illness Pt underwent R TKR and at time of evaluation is POD#1. No reported post-op complications. At baseline pt reports community ambulation with single point cane. No reported falls in the last 12 months.     PT Comments    Pt requires heavy encouragement to ambulate with therapist due to nausea but eventually agrees. She is able to progress ambulation distance and quality this afternoon but still demonstrates considerable RLE buckling. Pt able to complete all bed exercises as instructed but still requires assist for SLR and SAQ on RLE. Will continue to progress ambulation distance and strengthening at next visit. Pt will benefit from skilled PT services to address deficits in strength, balance, and mobility in order to return to full function at home.    Follow Up Recommendations  SNF     Equipment Recommendations  None recommended by PT    Recommendations for Other Services       Precautions / Restrictions Precautions Precautions: Knee;Fall Precaution Booklet Issued: Yes (comment) Required Braces or Orthoses: Knee Immobilizer - Right Knee Immobilizer - Right: On when out of bed or walking;Discontinue once straight leg raise with < 10 degree lag Restrictions Weight Bearing Restrictions: Yes RLE Weight Bearing: Weight bearing as tolerated    Mobility  Bed Mobility Overal bed mobility: Needs Assistance Bed Mobility: Supine to Sit;Sit to Supine     Supine to sit: Mod assist;HOB elevated Sit to supine: Min assist   General bed mobility comments: Cues for sequencing and assist for RLE and trunk elevation from sidelying to sitting. Use of bed rails required. Overhead trapeze to scoot up toward [facility] once back in bed  Transfers Overall transfer level: Needs assistance Equipment used: Rolling walker (2 wheeled) Transfers: Sit to/from  Stand Sit to Stand: Mod assist         General transfer comment: Cues for proper sequencing and hand placement. Decreased weight shifting to RLE during transfer noted. Pt cued for upright posture once standing and to allow RLE to bear weight. KI donned on RLE for all transfers and ambulation. Increase in pain reported during afternoon session. Increased assistance required.  Ambulation/Gait Ambulation/Gait assistance: Min assist Ambulation Distance (Feet): 35 Feet Assistive device: Rolling walker (2 wheeled) Gait Pattern/deviations: Step-to pattern   Gait velocity interpretation: <1.8 ft/sec, indicative of risk for recurrent falls General Gait Details: Pt ambulated from bed to RN station and back to bed. Pt with consistent RLE buckling even with KI donned throughout entire ambulation. Antalgic gait with decreased step length on LLE. Heavy UE support throughout ambulation but improves as distance progresses. Cues for proper sequencing and posture throughout. Nausea monitored   Stairs            Wheelchair Mobility    Modified Rankin (Stroke Patients Only)       Balance     Sitting balance-Leahy Scale: Fair       Standing balance-Leahy Scale: Poor                      Cognition Arousal/Alertness: Awake/alert Behavior During Therapy: WFL for tasks assessed/performed Overall Cognitive Status: Within Functional Limits for tasks assessed                      Exercises Total Joint Exercises Ankle Circles/Pumps: Strengthening;Both;Supine;20 reps Quad Sets: Strengthening;Both;Supine;20 reps Gluteal Sets: Strengthening;Both;Supine;20 reps Towel Squeeze:  Strengthening;Both;Supine;20 reps Short Arc Quad: Strengthening;Both;Supine;20 reps (10 reps on RLE due to pain) Heel Slides: Strengthening;Both;Supine;20 reps Hip ABduction/ADduction: Strengthening;Both;Supine;20 reps Straight Leg Raises: Strengthening;Both;Supine;20 reps (Assist required RLE SAQ and  SLR)    General Comments        Pertinent Vitals/Pain Pain Assessment: 0-10 Pain Score: 6  Pain Location: R knee Pain Intervention(s): Limited activity within patient's tolerance;Repositioned;Monitored during session    Home Living                      Prior Function            PT Goals (current goals can now be found in the care plan section) Acute Rehab PT Goals Patient Stated Goal: To get back to normal PT Goal Formulation: With patient Time For Goal Achievement: 08/09/14 Potential to Achieve Goals: Good Progress towards PT goals: Progressing toward goals    Frequency  BID    PT Plan Current plan remains appropriate    Co-evaluation             End of Session Equipment Utilized During Treatment: Gait belt Activity Tolerance: Patient tolerated treatment well Patient left: with call bell/phone within reach;in bed;with nursing/sitter in room (RN assisting with toileting)     Time: 5035-4656 PT Time Calculation (min) (ACUTE ONLY): 31 min  Charges:  $Gait Training: 8-22 mins $Therapeutic Exercise: 8-22 mins                    G Codes:      Lyndel Safe Huprich PT, DPT Huprich,Jason 07/26/2014, 3:40 PM

## 2014-07-26 NOTE — Progress Notes (Signed)
Clinical Education officer, museum (CSW) presented bed offers to patient. Patient chose [facility]. CSW made [facility] admissions coordinator at [place] aware of above. Plan is for patient to D/C to [facility] Friday 07/28/14. CSW will continue to follow and assist as needed.   Blima Rich, [place] [phone]

## 2014-07-26 NOTE — Evaluation (Addendum)
Physical Therapy Evaluation Patient Details Name: Cynthia Castillo MRN: [id number] DOB: 21-Sep-1940 Today's Date: 07/26/2014   History of Present Illness  Pt underwent R TKR and at time of evaluation is POD#1. No reported post-op complications. At baseline pt reports community ambulation with single point cane. No reported falls in the last 12 months.   Clinical Impression  Pt demonstrates difficulty with bed mobility, transfers, and ambulation. She is somewhat limited by pain but is also limited by RLE weakness requiring knee immobilizer for all mobility. Pt will need SNF placement at discharge to facilitate safe return to prior level of function at home. Pt will benefit from skilled PT services to address deficits in strength, balance, and mobility in order to return to full function at home.     Follow Up Recommendations SNF    Equipment Recommendations  None recommended by PT    Recommendations for Other Services       Precautions / Restrictions Precautions Precautions: Knee;Fall Precaution Booklet Issued: Yes (comment) Required Braces or Orthoses: Knee Immobilizer - Right (Pt unable to perform SLR) Knee Immobilizer - Right: On when out of bed or walking;Discontinue once straight leg raise with < 10 degree lag Restrictions Weight Bearing Restrictions: Yes RLE Weight Bearing: Weight bearing as tolerated      Mobility  Bed Mobility Overal bed mobility: Needs Assistance Bed Mobility: Supine to Sit     Supine to sit: Mod assist;HOB elevated     General bed mobility comments: Cues for sequencing and assist for RLE and trunk elevation from sidelying to sitting. Use of bed rails required  Transfers Overall transfer level: Needs assistance Equipment used: Rolling walker (2 wheeled) Transfers: Sit to/from Stand Sit to Stand: Min assist         General transfer comment: Cues for proper sequencing and hand placement. Decreased weight shifting to RLE during transfer noted. Pt  cued for upright posture once standing and to allow RLE to bear weight. KI donned on RLE for all transfers and ambulation  Ambulation/Gait Ambulation/Gait assistance: Mod assist Ambulation Distance (Feet): 20 Feet Assistive device: Rolling walker (2 wheeled)     Gait velocity interpretation: <1.8 ft/sec, indicative of risk for recurrent falls General Gait Details: Heavy cues for sequencing and use of UE during gait. KI donned on RLE during ambulation. Pt demonstrates antalgic gait with RLE buckling during stance. Decreased stance time on RLE and decreased LLE step length. Sequencing improves. Cues for upright posture required and standing rest breaks provided. Gait speed is very slow   Financial trader Rankin (Stroke Patients Only)       Balance Overall balance assessment: Needs assistance Sitting-balance support: No upper extremity supported;Feet supported Sitting balance-Leahy Scale: Fair     Standing balance support: Bilateral upper extremity supported Standing balance-Leahy Scale: Poor                               Pertinent Vitals/Pain Pain Assessment: 0-10 Pain Score: 6  (Decreases to 5/10 after physical therapy) Pain Location: R knee Pain Intervention(s): Limited activity within patient's tolerance;Monitored during session;Premedicated before session;Repositioned    Home Living Family/patient expects to be discharged to:: Skilled nursing facility Living Arrangements: Alone Available Help at Discharge:  (None) Type of Home: House Home Access: Stairs to enter Entrance Stairs-Rails: None (Can hold door frame) Entrance Stairs-Number of Steps: 1 Home  Layout: Multi-level Home Equipment: Walker - 2 wheels;Cane - single point [employer], no seat or grab bars, no BSC)      Prior Function Level of Independence: Independent with assistive device(s)         Comments: Single point cane, Independent for ADLs/IADLs      Hand Dominance        Extremity/Trunk Assessment   Upper Extremity Assessment: Overall WFL for tasks assessed;Defer to OT evaluation           Lower Extremity Assessment: RLE deficits/detail RLE Deficits / Details: LLE at least 4+/5. Pt unable to complete R SLR or R SAQ without assistance. Full DF/PF strength in RLE. Denies N/T throughout RLE       Communication   Communication: No difficulties  Cognition Arousal/Alertness: Awake/alert Behavior During Therapy: WFL for tasks assessed/performed Overall Cognitive Status: Within Functional Limits for tasks assessed                      General Comments      Exercises Total Joint Exercises Ankle Circles/Pumps: Strengthening;Both;10 reps;Supine Quad Sets: Strengthening;Both;10 reps;Supine Gluteal Sets: Strengthening;Both;10 reps;Supine Towel Squeeze: Strengthening;Both;10 reps;Supine Short Arc Quad: Strengthening;Both;10 reps;Supine Heel Slides: Strengthening;Both;10 reps;Supine Hip ABduction/ADduction: Strengthening;Both;10 reps;Supine Straight Leg Raises: Strengthening;Both;10 reps;Supine (Assist required RLE SAQ and SLR) Goniometric ROM: -8 to 66 degrees AROM with overpressure, pain limited      Assessment/Plan    PT Assessment Patient needs continued PT services  PT Diagnosis Difficulty walking;Abnormality of gait;Generalized weakness;Acute pain   PT Problem List Decreased strength;Decreased range of motion;Decreased activity tolerance;Decreased balance;Decreased mobility;Decreased knowledge of use of DME;Pain  PT Treatment Interventions DME instruction;Gait training;Stair training;Functional mobility training;Therapeutic activities;Therapeutic exercise;Balance training;Neuromuscular re-education;Patient/family education;Manual techniques   PT Goals (Current goals can be found in the Care Plan section) Acute Rehab PT Goals Patient Stated Goal: "I want to be able to walk normal again" PT Goal  Formulation: With patient Time For Goal Achievement: 08/09/14 Potential to Achieve Goals: Good    Frequency BID   Barriers to discharge Decreased caregiver support Will be staying alone, no family nearby    Co-evaluation               End of Session Equipment Utilized During Treatment: Gait belt Activity Tolerance: Patient tolerated treatment well Patient left: in chair;with call bell/phone within reach (with OT who agrees to set up patient) Nurse Communication:  (Pain status)         Time: 3762-8315 PT Time Calculation (min) (ACUTE ONLY): 42 min   Charges:   PT Evaluation $Initial PT Evaluation Tier I: 1 Procedure PT Treatments $Therapeutic Exercise: 8-22 mins   PT G Codes:       Lyndel Safe Marae Cottrell PT, DPT   Jaidan Stachnik 07/26/2014, 10:25 AM

## 2014-07-26 NOTE — Care Management Note (Signed)
Case Management Note  Patient Details  Name: Cynthia Castillo MRN: [id number] Date of Birth: [birth year]  Subjective/Objective:                  Met with patient to discuss discharge planning. She would like to go to SNF. She did state that she was able to walk to the door of her room today with a rolling walker. She states she has a rolling walker available at home where she lives alone. PT is currently recommending SNF. Patient states she will be able to go to her daughters in [place]) if she does well enough to return home. RNCM will need to arrange home health in that area. She uses [facility] for Rx. [phone].  Action/Plan: RNCM will continue to follow. She has no preference for home health providers.   Expected Discharge Date:  07/28/14               Expected Discharge Plan:     In-House Referral:     Discharge planning Services  CM Consult  Post Acute Care Choice:    Choice offered to:  Patient  DME Arranged:    DME Agency:     HH Arranged:    [place] Agency:     Status of Service:     Medicare Important Message Given:    Date Medicare IM Given:    Medicare IM give by:    Date Additional Medicare IM Given:    Additional Medicare Important Message give by:     If discussed at [place] of Stay Meetings, dates discussed:    Additional Comments:  Marshell Garfinkel, RN 07/26/2014, 10:24 AM

## 2014-07-26 NOTE — Progress Notes (Signed)
Pt denies pain. dsg dry and intact with  Bone foam in place. No complaints.

## 2014-07-26 NOTE — Clinical Social Work Placement (Signed)
   CLINICAL SOCIAL WORK PLACEMENT  NOTE  Date:  07/26/2014  Patient Details  Name: Cynthia Castillo MRN: [id number] Date of Birth: 03/04/1940  Clinical Social Work is seeking post-discharge placement for this patient at the [place] level of care (*CSW will initial, date and re-position this form in  chart as items are completed):  Yes   Patient/family provided with [facility] Work Department's list of facilities offering this level of care within the geographic area requested by the patient (or if unable, by the patient's family).  Yes   Patient/family informed of their freedom to choose among providers that offer the needed level of care, that participate in Medicare, Medicaid or managed care program needed by the patient, have an available bed and are willing to accept the patient.  Yes   Patient/family informed of [facility]'s ownership interest in [facility] and [facility], as well as of the fact that they are under no obligation to receive care at these facilities.  PASRR submitted to EDS on 07/26/14     PASRR number received on 07/26/14     Existing PASRR number confirmed on       FL2 transmitted to all facilities in geographic area requested by pt/family on       FL2 transmitted to all facilities within larger geographic area on       Patient informed that his/her managed care company has contracts with or will negotiate with certain facilities, including the following:        Yes   Patient/family informed of bed offers received.  Patient chooses bed at  [facility])     Physician recommends and patient chooses bed at      Patient to be transferred to   on  .  Patient to be transferred to facility by       Patient family notified on   of transfer.  Name of family member notified:        PHYSICIAN Please sign FL2     Additional Comment:    _______________________________________________ Loralyn Freshwater,  LCSW 07/26/2014, 12:03 PM

## 2014-07-26 NOTE — Clinical Social Work Note (Signed)
Clinical Social Work Assessment  Patient Details  Name: Cynthia Castillo MRN: [id number] Date of Birth: 11-May-1940  Date of referral:  07/26/14               Reason for consult:  Facility Placement                Permission sought to share information with:  Chartered certified accountant granted to share information::  Yes, Verbal Permission Granted  Name::      [place]::   [place]  Relationship::     Contact Information:     Housing/Transportation Living arrangements for the past 2 months:  [place] of Information:  Patient Patient Interpreter Needed:  None Criminal Activity/Legal Involvement Pertinent to Current Situation/Hospitalization:  No - Comment as needed Significant Relationships:  Adult Children Lives with:  Self Do you feel safe going back to the place where you live?  Yes Need for family participation in patient care:  No (Coment)  Care giving concerns:  Patient lives alone in [facility]).    Social Worker assessment / plan: [place] representative (CSW) received SNF consult. PT is recommending SNF. CSW met with patient to discuss D/C plan. Patient was sitting up in chair and was alert and oriented. Patient was pleasant throughout assessment. Patient reported that she lives alone in [place]. Patient has 1 daughter Alphonzo Severance that lives in [place]. Patient is agreeable to SNF search and prefers [facility]. SNF list was provided.   FL2 complete and faxed out.     Employment status:  Retired Nurse, adult PT Recommendations:  [place] / Referral to community resources:  [place]  Patient/Family's Response to care:  Patient is agreeable to [employer] and prefers [facility].  Patient/Family's Understanding of and Emotional Response to Diagnosis, Current Treatment, and Prognosis:  Patient thanked CSW for visit and assisting with placement.   Emotional Assessment Appearance:  Appears younger than stated age Attitude/Demeanor/Rapport:    Affect (typically observed):  Accepting, Calm, Pleasant Orientation:  Oriented to Self, Oriented to Place, Oriented to  Time, Oriented to Situation Alcohol / Substance use:  Not Applicable Psych involvement (Current and /or in the community):  No (Comment)  Discharge Needs  Concerns to be addressed:  Discharge Planning Concerns Readmission within the last 30 days:  No Current discharge risk:  Lives alone Barriers to Discharge:  Continued Medical Work up   Loralyn Freshwater, LCSW 07/26/2014, 12:04 PM

## 2014-07-26 NOTE — Progress Notes (Signed)
Inpatient Diabetes Program Recommendations  AACE/ADA: New Consensus Statement on Inpatient Glycemic Control (2013)  Target Ranges:  Prepandial:   less than 140 mg/dL      Peak postprandial:   less than 180 mg/dL (1-2 hours)      Critically ill patients:  140 - 180 mg/dL   Elevated lab glucose >200. Please consider monitoring CBGs TIDHS and start Novolog sensitive scale if needed.  Thank you  Raoul Pitch BSN, RN,CDE Inpatient Diabetes Coordinator Glycemic Management Team [phone] (team pager)

## 2014-07-26 NOTE — Anesthesia Post-op Follow-up Note (Signed)
  Anesthesia Pain Follow-up Note  Patient: Cynthia Castillo  Day #: 1  Date of Follow-up: 07/26/2014 Time: 7:42 AM  Last Vitals:  Filed Vitals:   07/26/14 0723  BP: 163/66  Pulse: 85  Temp: 36.9 C  Resp: 18    Level of Consciousness: alert  Pain: mild   Side Effects:None  Catheter Site Exam: not assessed  Plan: D/C from anesthesia care  Blima Singer

## 2014-07-26 NOTE — Progress Notes (Signed)
   Subjective: 1 Day Post-Op Procedure(s) (LRB): TOTAL KNEE ARTHROPLASTY (Right) Patient reports pain as mild.   Patient is well, and has had no acute complaints or problems We will start therapy today.  Plan is to go Rehab after hospital stay.  Objective: Vital signs in last 24 hours: Temp:  [97.3 F (36.3 C)-98.4 F (36.9 C)] 98.4 F (36.9 C) (07/06 0723) Pulse Rate:  [54-99] 85 (07/06 0723) Resp:  [13-26] 18 (07/06 0723) BP: (72-163)/(46-73) 163/66 mmHg (07/06 0723) SpO2:  [92 %-100 %] 94 % (07/06 0723) FiO2 (%):  [24 %] 24 % (07/05 1123) Weight:  [83.462 kg (184 lb)] 83.462 kg (184 lb) (07/05 1150)  Intake/Output from previous day: 07/05 0701 - 07/06 0700 In: 2120 [P.O.:120; I.V.:1950] Out: 1850 [Urine:1575; Stool:250; Blood:25] Intake/Output this shift: Total I/O In: 1058.8 [I.V.:1058.8] Out: 100 [Urine:100]   Recent Labs  07/25/14 1250 07/26/14 0505  HGB 11.3* 10.8*    Recent Labs  07/25/14 1250 07/26/14 0505  WBC 9.2 15.6*  RBC 4.49 4.28  HCT 35.5 33.9*  PLT 336 374    Recent Labs  07/25/14 1250 07/26/14 0505  NA  --  139  K  --  3.5  CL  --  109  CO2  --  23  BUN  --  11  CREATININE 0.89 0.91  GLUCOSE  --  212*  CALCIUM  --  8.9   No results for input(s): LABPT, INR in the last 72 hours.  EXAM General - Patient is Alert, Appropriate and Oriented Extremity - Neurovascular intact Sensation intact distally Intact pulses distally Dorsiflexion/Plantar flexion intact Dressing - dressing C/D/I Motor Function - intact, moving foot and toes well on exam. Unable to straight leg raise  Past Medical History  Diagnosis Date  . Hypertension   . Arthritis     hands  . Murmur   . Complication of anesthesia   . PONV (postoperative nausea and vomiting)   . Diabetes mellitus without complication 2820    diet control    Assessment/Plan:   1 Day Post-Op Procedure(s) (LRB): TOTAL KNEE ARTHROPLASTY (Right) Active Problems:   Primary  osteoarthritis of knee   Acute post op blood loss anemia     Estimated body mass index is 34.78 kg/(m^2) as calculated from the following:   Height as of this encounter: 5\' 1"  (1.549 m).   Weight as of this encounter: 83.462 kg (184 lb). Advance diet Up with therapy  Recheck labs in the am   DVT Prophylaxis - Lovenox, Foot Pumps and TED hose Weight-Bearing as tolerated to right leg D/C O2 and Pulse OX and try on Room Air  T. Rachelle Hora, PA-C [place] 07/26/2014, 8:00 AM

## 2014-07-26 NOTE — Anesthesia Postprocedure Evaluation (Signed)
  Anesthesia Post-op Note  Patient: Cynthia Castillo  Procedure(s) Performed: Procedure(s): TOTAL KNEE ARTHROPLASTY (Right)  Anesthesia type:Spinal  Patient location: Floor  Post pain: Pain level controlled  Post assessment: Post-op Vital signs reviewed, Patient's Cardiovascular Status Stable, Respiratory Function Stable, Patent Airway and No signs of Nausea or vomiting  Post vital signs: Reviewed and stable  Last Vitals:  Filed Vitals:   07/26/14 0723  BP: 163/66  Pulse: 85  Temp: 36.9 C  Resp: 18    Level of consciousness: awake, alert  and patient cooperative  Complications: No apparent anesthesia complications

## 2014-07-26 NOTE — Evaluation (Signed)
Occupational Therapy Evaluation Patient Details Name: Cynthia Castillo MRN: [id number] DOB: Aug 03, 1940 Today's Date: 07/26/2014    History of Present Illness This patient is a 74 year old female who came to [facility] for a R TKR   Clinical Impression   This patient is a 74 year old female who came to [facility] for a R total knee replacement.  Patient lives in a one story home with 1steps to enter and 2 steps to kitchen.  She had been independent with ADL and functional mobility including driving. She now requires miniamal assistance for lower body dressing while practicing Donned/doffed socks and pants to knees. She would benefit from Occupational Therapy for ADL/functioal mobility training.      Follow Up Recommendations  SNF    Equipment Recommendations    continue to evaluate    Recommendations for Other Services       Precautions / Restrictions Precautions Precautions: Knee;Fall Precaution Booklet Issued: Yes (comment) Required Braces or Orthoses: Knee Immobilizer - Right Knee Immobilizer - Right: On when out of bed or walking;Discontinue once straight leg raise with < 10 degree lag Restrictions Weight Bearing Restrictions: Yes RLE Weight Bearing: Weight bearing as tolerated      Mobility Bed Mobility           Transfers            Balance                                  ADL                                         General ADL Comments: Patient had been independent - today practiced donning and doffing socks and pants using hip kit. List of vendors given.     Vision     Perception     Praxis      Pertinent Vitals/Pain Pain Assessment: 0-10 Pain Score: 4  Pain Location: R knee Pain Intervention(s): Limited activity within patient's tolerance;Monitored during session;Premedicated before session;Repositioned     Hand Dominance     Extremity/Trunk Assessment Upper Extremity Assessment Upper  Extremity Assessment: Overall WFL for tasks assessed         Communication Communication Communication: No difficulties   Cognition Arousal/Alertness: Awake/alert Behavior During Therapy: WFL for tasks assessed/performed Overall Cognitive Status: Within Functional Limits for tasks assessed                     General Comments       Exercises Exercises: Total Joint     Shoulder Instructions      Home Living Family/patient expects to be discharged to:: Skilled nursing facility Living Arrangements: Alone Available Help at Discharge:  (None) Type of Home: House Home Access: Stairs to enter [employer] of Steps: 1 Entrance Stairs-Rails: None Home Layout: Multi-level Alternate Level Stairs-Number of Steps: 2 Alternate Level Stairs-Rails: Can reach both           Home Equipment: Walker - 2 wheels;Cane - single point          Prior Functioning/Environment Level of Independence: Independent with assistive device(s)        Comments: Single point cane, Independent for ADLs/IADLs    OT Diagnosis: Acute pain   OT Problem List:     OT  Treatment/Interventions: Self-care/ADL training    OT Goals(Current goals can be found in the care plan section) Acute Rehab OT Goals Patient Stated Goal: To get back to normal OT Goal Formulation: With patient Time For Goal Achievement: 08/09/14 Potential to Achieve Goals: Good  OT Frequency: Min 1X/week   Barriers to D/C:            Co-evaluation              End of Session Equipment Utilized During Treatment:  (Hip kit)  Activity Tolerance: Patient tolerated treatment well Patient left: in chair;with call bell/phone within reach;with chair alarm set   Time: 1016-1036 OT Time Calculation (min): 20 min Charges:  OT General Charges $OT Visit: 1 Procedure OT Evaluation $Initial OT Evaluation Tier I: 1 Procedure OT Treatments $Self Care/Home Management : 8-22 mins G-Codes:   Sharon Mt,  MS/OTR/L  Valente David M 07/26/2014, 10:49 AM

## 2014-07-27 LAB — CBC
HEMATOCRIT: 34.6 % — AB (ref 35.0–47.0)
Hemoglobin: 11.3 g/dL — ABNORMAL LOW (ref 12.0–16.0)
MCH: 25.4 pg — ABNORMAL LOW (ref 26.0–34.0)
MCHC: 32.7 g/dL (ref 32.0–36.0)
MCV: 77.6 fL — ABNORMAL LOW (ref 80.0–100.0)
Platelets: 359 10*3/uL (ref 150–440)
RBC: 4.46 MIL/uL (ref 3.80–5.20)
RDW: 14.1 % (ref 11.5–14.5)
WBC: 12.4 10*3/uL — ABNORMAL HIGH (ref 3.6–11.0)

## 2014-07-27 LAB — SURGICAL PATHOLOGY

## 2014-07-27 MED ORDER — OXYCODONE HCL 5 MG PO TABS: 5.0000 mg | ORAL_TABLET | ORAL | Status: DC | PRN

## 2014-07-27 MED ORDER — ENOXAPARIN SODIUM 40 MG/0.4ML ~~LOC~~ SOLN: 40.0000 mg | SUBCUTANEOUS | Status: DC

## 2014-07-27 NOTE — Progress Notes (Signed)
Plan is for patient to D/C to [facility] tomorrow 07/28/14. Clinical Education officer, museum (CSW) met with patient and confirmed plan. [facility] admissions coordinator at [facility] is aware of above. CSW will continue to follow and assist as needed.   Blima Rich, [place] [phone]

## 2014-07-27 NOTE — Care Management (Signed)
Important Message  Patient Details  Name: Cynthia Castillo MRN: [id number] Date of Birth: [birth year]   Medicare Important Message Given:  Yes-second notification given    Juliann Pulse A [place] 07/27/2014, 1:03 PM

## 2014-07-27 NOTE — Progress Notes (Signed)
Occupational Therapy Treatment Patient Details Name: Cynthia Castillo MRN: [id number] DOB: 02/17/1940 Today's Date: 07/27/2014    History of present illness Patient is a 74 year old female who is post op day 2 with R TKR.   OT comments  Patient declined actual dressing, but agreed to practice techniques.  Follow Up Recommendations       Equipment Recommendations       Recommendations for Other Services      Precautions / Restrictions Precautions Precautions: Knee;Fall Precaution Booklet Issued: Yes (comment) Required Braces or Orthoses: Knee Immobilizer - Right Knee Immobilizer - Right: On when out of bed or walking;Discontinue once straight leg raise with < 10 degree lag Restrictions Weight Bearing Restrictions: Yes RLE Weight Bearing: Weight bearing as tolerated       Mobility Bed Mobility      Transfers            Balance                             ADL                                         General ADL Comments: Patient declined actual dressing but very willing to practice technique. Donned/doffed socks and pants to knees using hip kit with minimal assist. Cues for technique and safety.      Vision                     Perception     Praxis      Cognition   Behavior During Therapy: WFL for tasks assessed/performed Overall Cognitive Status: Within Functional Limits for tasks assessed                       Extremity/Trunk Assessment               Exercises   Shoulder Instructions       General Comments      Pertinent Vitals/ Pain         Home Living                                          Prior Functioning/Environment              Frequency       Progress Toward Goals  OT Goals(current goals can now be found in the care plan section)  Progress towards OT goals: Progressing toward goals  Acute Rehab OT Goals Patient Stated Goal: "I want to be able to walk  normal again"  Plan Discharge plan remains appropriate    Co-evaluation                 End of Session Equipment Utilized During Treatment:  (Hip kit)   Activity Tolerance Patient tolerated treatment well   Patient Left in bed;with call bell/phone within reach;with bed alarm set   Nurse Communication          Time: 6979-4801 OT Time Calculation (min): 13 min  Charges: OT General Charges $OT Visit: 1 Procedure OT Treatments $Self Care/Home Management : 8-22 mins  Myrene Galas, MS/OTR/L  07/27/2014, 2:57 PM

## 2014-07-27 NOTE — Discharge Summary (Signed)
Physician Discharge Summary  Patient ID: Cynthia Castillo MRN: [id number] DOB/AGE: 22-Aug-1940 74 y.o.  Admit date: 07/25/2014 Discharge date: 07/27/2014  Admission Diagnoses:  DEGENERATIVE OA Right knee   Discharge Diagnoses: Patient Active Problem List   Diagnosis Date Noted  . Primary osteoarthritis of knee 07/25/2014  . Mastalgia 02/01/2014    Past Medical History  Diagnosis Date  . Hypertension   . Arthritis     hands  . Murmur   . Complication of anesthesia   . PONV (postoperative nausea and vomiting)   . Diabetes mellitus without complication 2409    diet control     Transfusion: none   Consultants (if any):  none  Discharged Condition: Improved  Hospital Course: Cynthia Castillo is an 74 y.o. female who was admitted 07/25/2014 with a diagnosis of Right knee OA and went to the operating room on 07/25/2014 and underwent the above named procedures.    Surgeries: Procedure(s): TOTAL KNEE ARTHROPLASTY on 07/25/2014 Patient tolerated the surgery well. Taken to PACU where she was stabilized and then transferred to the orthopedic floor.  Started on Lovenox 30 q 12 hrs. Foot pumps applied bilaterally at 80 mm. Heels elevated on bed with rolled towels. No evidence of DVT. Negative Homan. Physical therapy started on day #1 for gait training and transfer. OT started day #1 for ADL and assisted devices.  Patient's foley was d/c on day #1. Patient's IV and hemovac was d/c on day #2.  On post op day #3 patient was stable and ready for medically stable for discharge to SNF.  Implants: Medacta GMK sphere 2 femur right, 2 tibia with 11 stem, 10 mm flex insert, to patella all components cemented  She was given perioperative antibiotics:  Anti-infectives    Start     Dose/Rate Route Frequency Ordered Stop   07/25/14 1130  ceFAZolin (ANCEF) IVPB 2 g/50 mL premix     2 g 100 mL/hr over 30 Minutes Intravenous Every 6 hours 07/25/14 1123 07/26/14 0012   07/25/14 0601  ceFAZolin (ANCEF) IVPB  2 g/50 mL premix     2 g 100 mL/hr over 30 Minutes Intravenous On call to O.R. 07/25/14 0601 07/25/14 0730   07/25/14 0552  ceFAZolin (ANCEF) 2-3 GM-% IVPB SOLR    Comments:  Hallaji, Violet Ann: cabinet override      07/25/14 0552 07/25/14 1759    .  She was given sequential compression devices, early ambulation, and lovenox for DVT prophylaxis.  She benefited maximally from the hospital stay and there were no complications.    Recent vital signs:  Filed Vitals:   07/27/14 1036  BP: 174/66  Pulse:   Temp:   Resp:     Recent laboratory studies:  Lab Results  Component Value Date   HGB 11.3* 07/27/2014   HGB 10.8* 07/26/2014   HGB 11.3* 07/25/2014   Lab Results  Component Value Date   WBC 12.4* 07/27/2014   PLT 359 07/27/2014   Lab Results  Component Value Date   INR 0.96 07/12/2014   Lab Results  Component Value Date   NA 139 07/26/2014   K 3.5 07/26/2014   CL 109 07/26/2014   CO2 23 07/26/2014   BUN 11 07/26/2014   CREATININE 0.91 07/26/2014   GLUCOSE 212* 07/26/2014    Discharge Medications:     Medication List    TAKE these medications        enoxaparin 40 MG/0.4ML injection  Commonly known as:  LOVENOX  Inject 0.4 mLs (40 mg total) into the skin daily.     multivitamin capsule  Take 1 capsule by mouth daily.     Olmesartan-Amlodipine-HCTZ 40-10-25 MG Tabs  Commonly known as:  TRIBENZOR  Take 1 tablet by mouth daily.     oxyCODONE 5 MG immediate release tablet  Commonly known as:  Oxy IR/ROXICODONE  Take 1-2 tablets (5-10 mg total) by mouth every 3 (three) hours as needed for breakthrough pain.        Diagnostic Studies: Dg Knee 1-2 Views Right  07/25/2014   CLINICAL DATA:  Postoperative RIGHT knee surgery  EXAM: RIGHT KNEE - 1-2 VIEW  COMPARISON:  Portable exam at 1001 hours compared to 06/09/2009 and correlated with CT knee 06/06/2014  FINDINGS: Components of RIGHT knee prosthesis in expected positions.  Bones appear demineralized.  No  acute fracture, dislocation or bone destruction.  No periprosthetic lucency.  Expected postsurgical soft tissue changes and superimposed artifacts.  IMPRESSION: Osseous demineralization.  RIGHT knee prosthesis without acute complication.   Electronically Signed   By: Lavonia Dana M.D.   On: 07/25/2014 10:09    Disposition: Stable and ready for discharge to SNF on post op day #3        Follow-up Information    Follow up with MENZ,MICHAEL, MD In 2 weeks.   Specialty:  Orthopedic Surgery   Why:  For staple removal and skin check   Contact information:   [place] [address] [phone]        Signed: Feliberto Gottron 07/27/2014, 1:56 PM

## 2014-07-27 NOTE — Progress Notes (Signed)
Physical Therapy Treatment Patient Details Name: Cynthia Castillo MRN: [id number] DOB: [birth year] Today's Date: 07/27/2014    History of Present Illness Pt underwent R TKR and at time of evaluation is POD#1. No reported post-op complications. At baseline pt reports community ambulation with single point cane. No reported falls in the last 12 months.     PT Comments    Pt demonstrates improving mobility, transfers, and ambulation but still requiring considerable assistance. KI buckling in standing with poor quad firing during SLR and SAQ. Pt is able to progress to partial reciprocal pattern during ambulation. Pt will benefit from skilled PT services to address deficits in strength, balance, and mobility in order to return to full function at home.    Follow Up Recommendations  SNF     Equipment Recommendations  None recommended by PT    Recommendations for Other Services       Precautions / Restrictions Precautions Precautions: Knee;Fall Precaution Booklet Issued: Yes (comment) Required Braces or Orthoses: Knee Immobilizer - Right Knee Immobilizer - Right: On when out of bed or walking;Discontinue once straight leg raise with < 10 degree lag Restrictions Weight Bearing Restrictions: Yes RLE Weight Bearing: Weight bearing as tolerated    Mobility  Bed Mobility Overal bed mobility: Needs Assistance Bed Mobility: Supine to Sit;Sit to Supine     Supine to sit: Mod assist;HOB elevated     General bed mobility comments: Cues for sequencing and assist for RLE and trunk elevation from sidelying to sitting. Use of bed rails required.  Transfers Overall transfer level: Needs assistance Equipment used: Rolling walker (2 wheeled) Transfers: Sit to/from Stand Sit to Stand: Min assist         General transfer comment: Cues for proper sequencing and hand placement but improved carryover today. Improving weight shifting to RLE during transfer noted. Pt cued for upright posture once  standing. KI donned on RLE for all transfers and ambulation. Decreased pain reported today compared to previous sessions  Ambulation/Gait Ambulation/Gait assistance: Min assist Ambulation Distance (Feet): 80 Feet Assistive device: Rolling walker (2 wheeled)   Gait velocity: 10'=22 sec; 0.45 ft/sec Gait velocity interpretation: <1.8 ft/sec, indicative of risk for recurrent falls General Gait Details: Increased ambulation distance today compared to prior sessions. Pt still with considerable RLE buckling noted and KI donned for all ambulation. Initially gait is step-to pattern but pt is able to progress to partial step-through pattern with cues. Fatigue monitored throughout ambulation distance. Assist for steering walker. Gait is improving compared to prior sessions.   Stairs            Wheelchair Mobility    Modified Rankin (Stroke Patients Only)       Balance Overall balance assessment: Needs assistance   Sitting balance-Leahy Scale: Fair       Standing balance-Leahy Scale: Poor                      Cognition Arousal/Alertness: Awake/alert Behavior During Therapy: WFL for tasks assessed/performed Overall Cognitive Status: Within Functional Limits for tasks assessed                      Exercises Total Joint Exercises Ankle Circles/Pumps: Strengthening;Both;Supine;15 reps Quad Sets: Strengthening;Both;Supine;15 reps Gluteal Sets: Strengthening;Both;Supine;15 reps Towel Squeeze: Strengthening;Both;Supine;15 reps Short Arc Quad: Strengthening;Both;Supine;15 reps Heel Slides: Strengthening;Both;Supine;15 reps Hip ABduction/ADduction: Strengthening;Both;Supine;15 reps Straight Leg Raises: Strengthening;Both;Supine;15 reps (Assist required RLE SAQ and SLR) Goniometric ROM: -5 to 75 degrees AROM with overpressure, pain  limited    General Comments        Pertinent Vitals/Pain Pain Assessment: 0-10 Pain Score: 6  Pain Location: R knee Pain  Intervention(s): Premedicated before session;Monitored during session    Home Living                      Prior Function            PT Goals (current goals can now be found in the care plan section) Acute Rehab PT Goals Patient Stated Goal: To get back to normal PT Goal Formulation: With patient Time For Goal Achievement: 08/09/14 Potential to Achieve Goals: Good Progress towards PT goals: Progressing toward goals    Frequency  BID    PT Plan Current plan remains appropriate    Co-evaluation             End of Session Equipment Utilized During Treatment: Gait belt Activity Tolerance: Patient tolerated treatment well Patient left: with call bell/phone within reach;in chair;with chair alarm set;with SCD's reapplied (polar care in place, bone foam under heel)     Time: 1610-9604 PT Time Calculation (min) (ACUTE ONLY): 33 min  Charges:  $Gait Training: 8-22 mins $Therapeutic Exercise: 8-22 mins                    G Codes:      Lyndel Safe Huprich PT, DPT   Huprich,Jason 07/27/2014, 10:19 AM

## 2014-07-27 NOTE — Progress Notes (Signed)
   Subjective: 2 Days Post-Op Procedure(s) (LRB): TOTAL KNEE ARTHROPLASTY (Right) Patient reports pain as mild.   Patient is well, and has had no acute complaints or problems We will continue with therapy today.  Plan is to go Rehab Friday  Objective: Vital signs in last 24 hours: Temp:  [97.5 F (36.4 C)-99.2 F (37.3 C)] 99.2 F (37.3 C) (07/07 0446) Pulse Rate:  [78-99] 99 (07/07 0446) Resp:  [17-18] 18 (07/07 0446) BP: (149-165)/(61-74) 164/63 mmHg (07/07 0446) SpO2:  [92 %-100 %] 92 % (07/07 0446)  Intake/Output from previous day: 07/06 0701 - 07/07 0700 In: 1778.8 [P.O.:720; I.V.:1058.8] Out: 1200 [Urine:1200] Intake/Output this shift:     Recent Labs  07/25/14 1250 07/26/14 0505 07/27/14 0430  HGB 11.3* 10.8* 11.3*    Recent Labs  07/26/14 0505 07/27/14 0430  WBC 15.6* 12.4*  RBC 4.28 4.46  HCT 33.9* 34.6*  PLT 374 359    Recent Labs  07/25/14 1250 07/26/14 0505  NA  --  139  K  --  3.5  CL  --  109  CO2  --  23  BUN  --  11  CREATININE 0.89 0.91  GLUCOSE  --  212*  CALCIUM  --  8.9   No results for input(s): LABPT, INR in the last 72 hours.  EXAM General - Patient is Alert, Appropriate and Oriented Extremity - Neurovascular intact Sensation intact distally Intact pulses distally Dorsiflexion/Plantar flexion intact  - homans sign Dressing - dressing C/D/I, changed to honeycomb Motor Function - intact, moving foot and toes well on exam. Unable to straight leg raise  Past Medical History  Diagnosis Date  . Hypertension   . Arthritis     hands  . Murmur   . Complication of anesthesia   . PONV (postoperative nausea and vomiting)   . Diabetes mellitus without complication 0354    diet control    Assessment/Plan:   2 Days Post-Op Procedure(s) (LRB): TOTAL KNEE ARTHROPLASTY (Right) Active Problems:   Primary osteoarthritis of knee   Acute post op blood loss anemia   Estimated body mass index is 34.78 kg/(m^2) as calculated from  the following:   Height as of this encounter: 5\' 1"  (1.549 m).   Weight as of this encounter: 83.462 kg (184 lb). Advance diet Up with therapy  Needs BM before discharge Discharge IV fluids    DVT Prophylaxis - Lovenox, Foot Pumps and TED hose Weight-Bearing as tolerated to right leg D/C O2 and Pulse OX and try on Room Air  T. Rachelle Hora, PA-C [place] 07/27/2014, 7:21 AM

## 2014-07-27 NOTE — Progress Notes (Signed)
Physical Therapy Treatment Patient Details Name: Cynthia Castillo MRN: [id number] DOB: 02/14/1940 Today's Date: 07/27/2014    History of Present Illness Pt underwent R TKR and at time of evaluation is POD#1. No reported post-op complications. At baseline pt reports community ambulation with single point cane. No reported falls in the last 12 months.     PT Comments    Pt demonstrates improving ambulation distance and gait quality. Still requiring assistance for mobility and reports considerable pain with R knee flexion. Pt still needs assist with R SLR and R LAQ and has poor firing of quadriceps. Pt will be appropriate to discharge to SNF when medically indicated. Pt will benefit from skilled PT services to address deficits in strength, balance, and mobility in order to return to full function at home.    Follow Up Recommendations  SNF     Equipment Recommendations  None recommended by PT    Recommendations for Other Services       Precautions / Restrictions Precautions Precautions: Knee;Fall Precaution Booklet Issued: Yes (comment) Required Braces or Orthoses: Knee Immobilizer - Right Knee Immobilizer - Right: On when out of bed or walking;Discontinue once straight leg raise with < 10 degree lag Restrictions Weight Bearing Restrictions: Yes RLE Weight Bearing: Weight bearing as tolerated    Mobility  Bed Mobility Overal bed mobility: Needs Assistance Bed Mobility: Supine to Sit;Sit to Supine     Supine to sit: Mod assist;HOB elevated Sit to supine: Min assist   General bed mobility comments: Cues for sequencing and assist for RLE and trunk elevation from sidelying to sitting. Use of bed rails required. Cues to scoot toward EOB  Transfers Overall transfer level: Needs assistance Equipment used: Rolling walker (2 wheeled) Transfers: Sit to/from Stand Sit to Stand: Min assist         General transfer comment: Cues for proper hand placement and sequencing. KI donned due  to RLE instability. Continued reports of pain but decreases with repeated transfers. Stil with decreased weight acceptance to RLE  Ambulation/Gait Ambulation/Gait assistance: Min assist Ambulation Distance (Feet): 120 Feet Assistive device: Rolling walker (2 wheeled)     Gait velocity interpretation: <1.8 ft/sec, indicative of risk for recurrent falls General Gait Details: Increased ambulation distance this afternoon. Gait quality slowly improves and pt is able to progress to reciprocal step-through pattern. Reviewed walker sequencing. Encouragement provided during ambualtion and fatigue and pain monitored.   Stairs            Wheelchair Mobility    Modified Rankin (Stroke Patients Only)       Balance Overall balance assessment: Needs assistance   Sitting balance-Leahy Scale: Fair       Standing balance-Leahy Scale: Poor                      Cognition Arousal/Alertness: Awake/alert Behavior During Therapy: WFL for tasks assessed/performed Overall Cognitive Status: Within Functional Limits for tasks assessed                      Exercises Total Joint Exercises Towel Squeeze: Strengthening;Both;Seated;10 reps Heel Slides: Strengthening;Both;Seated;10 reps Hip ABduction/ADduction: Strengthening;Both;10 reps;Seated Long Arc Quad: Strengthening;Both;10 reps;Seated (Assist for RLE) Marching in Standing: Strengthening;Both;10 reps;Seated    General Comments        Pertinent Vitals/Pain Pain Assessment: 0-10 Pain Score: 5  Pain Location: R knee Pain Intervention(s): Monitored during session (Pt refuses pain meds from RN at time of arrival)    Home Living  Prior Function            PT Goals (current goals can now be found in the care plan section) Acute Rehab PT Goals Patient Stated Goal: "I want to be able to walk normal again" PT Goal Formulation: With patient Time For Goal Achievement: 08/09/14 Potential to  Achieve Goals: Good Progress towards PT goals: Progressing toward goals    Frequency  BID    PT Plan Current plan remains appropriate    Co-evaluation             End of Session Equipment Utilized During Treatment: Gait belt Activity Tolerance: Patient tolerated treatment well Patient left: in bed;with call bell/phone within reach (Left in care of OT who agrees to position patient)     Time: 0165-5374 PT Time Calculation (min) (ACUTE ONLY): 27 min  Charges:  $Gait Training: 8-22 mins $Therapeutic Exercise: 8-22 mins                    G Codes:      Lyndel Safe Baelynn Schmuhl PT, DPT   Sundee Garland 07/27/2014, 2:40 PM

## 2014-07-27 NOTE — Discharge Instructions (Signed)

## 2014-07-28 DIAGNOSIS — M6281 Muscle weakness (generalized): Secondary | ICD-10-CM | POA: Diagnosis not present

## 2014-07-28 DIAGNOSIS — R6889 Other general symptoms and signs: Secondary | ICD-10-CM | POA: Diagnosis not present

## 2014-07-28 DIAGNOSIS — M158 Other polyosteoarthritis: Secondary | ICD-10-CM | POA: Diagnosis not present

## 2014-07-28 DIAGNOSIS — I10 Essential (primary) hypertension: Secondary | ICD-10-CM | POA: Diagnosis not present

## 2014-07-28 DIAGNOSIS — E1143 Type 2 diabetes mellitus with diabetic autonomic (poly)neuropathy: Secondary | ICD-10-CM | POA: Diagnosis not present

## 2014-07-28 DIAGNOSIS — E039 Hypothyroidism, unspecified: Secondary | ICD-10-CM | POA: Diagnosis not present

## 2014-07-28 DIAGNOSIS — J3089 Other allergic rhinitis: Secondary | ICD-10-CM | POA: Diagnosis not present

## 2014-07-28 DIAGNOSIS — M1990 Unspecified osteoarthritis, unspecified site: Secondary | ICD-10-CM | POA: Diagnosis not present

## 2014-07-28 DIAGNOSIS — R262 Difficulty in walking, not elsewhere classified: Secondary | ICD-10-CM | POA: Diagnosis not present

## 2014-07-28 DIAGNOSIS — E785 Hyperlipidemia, unspecified: Secondary | ICD-10-CM | POA: Diagnosis not present

## 2014-07-28 DIAGNOSIS — E559 Vitamin D deficiency, unspecified: Secondary | ICD-10-CM | POA: Diagnosis not present

## 2014-07-28 DIAGNOSIS — Z96651 Presence of right artificial knee joint: Secondary | ICD-10-CM | POA: Diagnosis not present

## 2014-07-28 LAB — URINALYSIS COMPLETE WITH MICROSCOPIC (ARMC ONLY)
BACTERIA UA: NONE SEEN
Bilirubin Urine: NEGATIVE
Glucose, UA: NEGATIVE mg/dL
Hgb urine dipstick: NEGATIVE
LEUKOCYTES UA: NEGATIVE
NITRITE: NEGATIVE
PH: 6 (ref 5.0–8.0)
Protein, ur: NEGATIVE mg/dL
Specific Gravity, Urine: 1.011 (ref 1.005–1.030)

## 2014-07-28 MED ORDER — LACTULOSE 10 GM/15ML PO SOLN
10.0000 g | Freq: Every day | ORAL | Status: AC
  Administered 2014-07-28: 10 g via ORAL
  Filled 2014-07-28: qty 30

## 2014-07-28 NOTE — Progress Notes (Signed)
   Subjective: 3 Days Post-Op Procedure(s) (LRB): TOTAL KNEE ARTHROPLASTY (Right) Patient reports pain as mild.   Patient is well. No complaints. We will continue with therapy today.  Plan is to go Rehab today  Nursing notes mild urinary incontinence.  Objective: Vital signs in last 24 hours: Temp:  [97.6 F (36.4 C)-98.4 F (36.9 C)] 98.4 F (36.9 C) (07/08 0349) Pulse Rate:  [84-97] 95 (07/08 0349) Resp:  [16-18] 18 (07/08 0349) BP: (149-178)/(55-78) 149/71 mmHg (07/08 0349) SpO2:  [94 %-99 %] 95 % (07/08 0349)  Intake/Output from previous day: 07/07 0701 - 07/08 0700 In: -  Out: 325 [Urine:325] Intake/Output this shift:     Recent Labs  07/25/14 1250 07/26/14 0505 07/27/14 0430  HGB 11.3* 10.8* 11.3*    Recent Labs  07/26/14 0505 07/27/14 0430  WBC 15.6* 12.4*  RBC 4.28 4.46  HCT 33.9* 34.6*  PLT 374 359    Recent Labs  07/25/14 1250 07/26/14 0505  NA  --  139  K  --  3.5  CL  --  109  CO2  --  23  BUN  --  11  CREATININE 0.89 0.91  GLUCOSE  --  212*  CALCIUM  --  8.9   No results for input(s): LABPT, INR in the last 72 hours.  EXAM General - Patient is Alert, Appropriate and Oriented Extremity - Neurovascular intact Sensation intact distally Intact pulses distally Dorsiflexion/Plantar flexion intact  - homans sign Dressing - dressing C/D/I, changed to honeycomb Motor Function - intact, moving foot and toes well on exam. Unable to straight leg raise  Past Medical History  Diagnosis Date  . Hypertension   . Arthritis     hands  . Murmur   . Complication of anesthesia   . PONV (postoperative nausea and vomiting)   . Diabetes mellitus without complication 8937    diet control    Assessment/Plan:   3 Days Post-Op Procedure(s) (LRB): TOTAL KNEE ARTHROPLASTY (Right) Active Problems:   Primary osteoarthritis of knee   Acute post op blood loss anemia    Urinary incontinence  Estimated body mass index is 34.78 kg/(m^2) as  calculated from the following:   Height as of this encounter: 5\' 1"  (1.549 m).   Weight as of this encounter: 83.462 kg (184 lb). Advance diet Up with therapy  Check UA Plan on discharge to rehab today Follow up with [facility] in 2 weeks   DVT Prophylaxis - Lovenox, Foot Pumps and TED hose Weight-Bearing as tolerated to right leg D/C O2 and Pulse OX and try on Room Air  T. Rachelle Hora, PA-C [place] 07/28/2014, 7:16 AM

## 2014-07-28 NOTE — Progress Notes (Signed)
Checked to see if pre-authorization would be needed for non-emergent EMS transport.  Per [facility] automated system, [phone], patient has a [employer] Medicare Complete Choice PPO policy.  [employer] Medicare PPO plans do not require pre-auth for non-emergent ground transports using service codes [phone] or [phone].

## 2014-07-28 NOTE — Progress Notes (Signed)
Physical Therapy Treatment Patient Details Name: ADISEN BENNION MRN: [id number] DOB: [birth year] Today's Date: 07/28/2014    History of Present Illness Post-op day 3, pt headed to rehab this afternoon     PT Comments    Pt continues to have weakness, and pain in R knee but ultimately she continues to show improvement.  She has a lot of pain with even light flexion PROM but is able to achieve 80s with much effort and encouragement.    Follow Up Recommendations  SNF     Equipment Recommendations  None recommended by PT    Recommendations for Other Services       Precautions / Restrictions Precautions Precautions: Knee;Fall Required Braces or Orthoses: Knee Immobilizer - Right Restrictions Weight Bearing Restrictions: Yes RLE Weight Bearing: Weight bearing as tolerated    Mobility  Bed Mobility Overal bed mobility: Needs Assistance Bed Mobility: Supine to Sit;Sit to Supine     Supine to sit: Mod assist        Transfers Overall transfer level: Needs assistance Equipment used: Rolling walker (2 wheeled)   Sit to Stand: Min guard         General transfer comment: Cues for proper hand placement and sequencing. KI donned due to RLE instability. Continued reports of pain but decreases with repeated transfers.  Ambulation/Gait Ambulation/Gait assistance: Min guard Ambulation Distance (Feet): 120 Feet Assistive device: Rolling walker (2 wheeled)       General Gait Details: Pt continues to have hesitancy with R WBing, but is able to ambulate better and with more confidence today.  Pt very fatigued with the effort.   Stairs            Wheelchair Mobility    Modified Rankin (Stroke Patients Only)       Balance                                    Cognition Arousal/Alertness: Awake/alert Behavior During Therapy: WFL for tasks assessed/performed Overall Cognitive Status: Within Functional Limits for tasks assessed                       Exercises Total Joint Exercises Ankle Circles/Pumps: Strengthening;10 reps Quad Sets: Strengthening;10 reps Short Arc Quad: AAROM;10 reps;AROM Heel Slides: PROM;AAROM;5 reps Hip ABduction/ADduction: AAROM;AROM;10 reps Straight Leg Raises: AAROM;10 reps Goniometric ROM: 2-82    General Comments        Pertinent Vitals/Pain Pain Assessment: 0-10 Pain Score: 3  (at rest, greatly increases with ROM acts)    Home Living                      Prior Function            PT Goals (current goals can now be found in the care plan section) Acute Rehab PT Goals PT Goal Formulation: With patient Time For Goal Achievement: 08/09/14 Potential to Achieve Goals: Good Progress towards PT goals: Progressing toward goals    Frequency  BID    PT Plan Current plan remains appropriate    Co-evaluation             End of Session Equipment Utilized During Treatment: Gait belt Activity Tolerance: Patient tolerated treatment well Patient left: with chair alarm set     Time: 4098-1191 PT Time Calculation (min) (ACUTE ONLY): 44 min  Charges:  $Gait Training: 8-22 mins $Therapeutic Exercise: 23-37  mins                    G Codes:     Wayne Both, [place], DPT [phone]  Kreg Shropshire 07/28/2014, 11:21 AM

## 2014-07-28 NOTE — Clinical Social Work Note (Signed)
Patient to discharge to [facility] today. CSW met with patient and she is aware and in agreement. Patient to transport via EMS for knee precautions. Orders sent to [facility] and nurse to call report. Shela Leff MSW,LCSWA [phone]

## 2014-07-28 NOTE — Clinical Social Work Placement (Signed)
   CLINICAL SOCIAL WORK PLACEMENT  NOTE  Date:  07/28/2014  Patient Details  Name: Cynthia Castillo MRN: [id number] Date of Birth: [birth year]  Clinical Social Work is seeking post-discharge placement for this patient at the [place] [place] level of care (*CSW will initial, date and re-position this form in  chart as items are completed):  Yes   Patient/family provided with [facility] Work Department's list of facilities offering this level of care within the geographic area requested by the patient (or if unable, by the patient's family).  Yes   Patient/family informed of their freedom to choose among providers that offer the needed level of care, that participate in Medicare, Medicaid or managed care program needed by the patient, have an available bed and are willing to accept the patient.  Yes   Patient/family informed of [place]'s ownership interest in [facility] and [facility], as well as of the fact that they are under no obligation to receive care at these facilities.  PASRR submitted to EDS on 07/26/14     PASRR number received on 07/26/14     Existing PASRR number confirmed on       FL2 transmitted to all facilities in geographic area requested by pt/family on       FL2 transmitted to all facilities within larger geographic area on       Patient informed that his/her managed care company has contracts with or will negotiate with certain facilities, including the following:        Yes   Patient/family informed of bed offers received.  Patient chooses bed at  [facility])     Physician recommends and patient chooses bed at      Patient to be transferred to  [facility]) on 07/28/14.  Patient to be transferred to facility by  (EMS)     Patient family notified on 07/28/14 of transfer.  Name of family member notified:   (Patient alert and oriented)     PHYSICIAN Please sign FL2     Additional Comment:     _______________________________________________ Shela Leff, LCSW 07/28/2014, 10:00 AM

## 2014-08-11 DIAGNOSIS — M1990 Unspecified osteoarthritis, unspecified site: Secondary | ICD-10-CM | POA: Diagnosis not present

## 2014-08-11 DIAGNOSIS — I10 Essential (primary) hypertension: Secondary | ICD-10-CM | POA: Diagnosis not present

## 2014-08-11 DIAGNOSIS — Z96651 Presence of right artificial knee joint: Secondary | ICD-10-CM | POA: Diagnosis not present

## 2014-08-11 LAB — URINE CULTURE: Special Requests: NORMAL

## 2014-08-14 ENCOUNTER — Telehealth: Payer: Self-pay | Admitting: Family Medicine

## 2014-08-14 DIAGNOSIS — R531 Weakness: Secondary | ICD-10-CM

## 2014-08-14 NOTE — Telephone Encounter (Signed)
See below-aa 

## 2014-08-14 NOTE — Telephone Encounter (Signed)
[place] with [facility] [facility] called stating pt will be discharging 08/15/2014 and is needing out-pt physical therapy referral from our office.  CB#[id number]

## 2014-08-15 DIAGNOSIS — E039 Hypothyroidism, unspecified: Secondary | ICD-10-CM | POA: Insufficient documentation

## 2014-08-15 DIAGNOSIS — E785 Hyperlipidemia, unspecified: Secondary | ICD-10-CM | POA: Insufficient documentation

## 2014-08-15 DIAGNOSIS — E669 Obesity, unspecified: Secondary | ICD-10-CM | POA: Insufficient documentation

## 2014-08-15 DIAGNOSIS — J309 Allergic rhinitis, unspecified: Secondary | ICD-10-CM | POA: Insufficient documentation

## 2014-08-15 DIAGNOSIS — N809 Endometriosis, unspecified: Secondary | ICD-10-CM | POA: Insufficient documentation

## 2014-08-15 DIAGNOSIS — M1710 Unilateral primary osteoarthritis, unspecified knee: Secondary | ICD-10-CM | POA: Insufficient documentation

## 2014-08-15 NOTE — Telephone Encounter (Signed)
Okay 

## 2014-08-15 NOTE — Telephone Encounter (Signed)
Order put in and sent to sarah to work on this=-aa

## 2014-08-17 ENCOUNTER — Ambulatory Visit: Payer: Medicare Other

## 2014-08-21 ENCOUNTER — Encounter: Payer: Medicare Other | Admitting: Physical Therapy

## 2014-08-21 DIAGNOSIS — Z96651 Presence of right artificial knee joint: Secondary | ICD-10-CM | POA: Diagnosis not present

## 2014-08-22 ENCOUNTER — Ambulatory Visit: Payer: Self-pay | Admitting: Family Medicine

## 2014-08-22 ENCOUNTER — Telehealth: Payer: Self-pay | Admitting: Family Medicine

## 2014-08-23 DIAGNOSIS — Z96651 Presence of right artificial knee joint: Secondary | ICD-10-CM | POA: Diagnosis not present

## 2014-08-25 DIAGNOSIS — Z96651 Presence of right artificial knee joint: Secondary | ICD-10-CM | POA: Diagnosis not present

## 2014-08-28 ENCOUNTER — Encounter: Payer: Medicare Other | Admitting: Physical Therapy

## 2014-08-28 DIAGNOSIS — Z96651 Presence of right artificial knee joint: Secondary | ICD-10-CM | POA: Diagnosis not present

## 2014-08-29 ENCOUNTER — Ambulatory Visit (INDEPENDENT_AMBULATORY_CARE_PROVIDER_SITE_OTHER): Payer: Medicare Other | Admitting: Family Medicine

## 2014-08-29 VITALS — BP 132/64 | HR 80 | Temp 98.0°F | Resp 16 | Ht 61.0 in | Wt 169.0 lb

## 2014-08-29 DIAGNOSIS — Z96651 Presence of right artificial knee joint: Secondary | ICD-10-CM | POA: Diagnosis not present

## 2014-08-29 NOTE — Progress Notes (Signed)
Patient ID: Cynthia Castillo, female   DOB: Apr 09, 1940, 74 y.o.   MRN: [id number]   Cynthia Castillo  MRN: [id number] DOB: 04-Oct-1940  Subjective:  HPI   1. Status post total right knee replacement Patient is a 74 year old female who presents for follow up of after being in rehab for right knee replacement.  She had her surgery on 07/25/14, she was discharged from [facility] on 08/15/14.  She has been at home with the help of her daughter until yesterday.  Today is the first day she is now by herself.  She states she feels she is doing well, she is not having any difficulty being alone and is not worried or afraid of being by herself.  She is going to out patient physical therapy 3 days per week. She was last seen by ortho last Friday (5 ) days ago and reports that they are pleased with her progress.  Patient states she is only taking pain medication when she has physical therapy.  She takes her medication prior to the therapy but has someone else drive her ( she has [employer] helping with her transportation).   Patient Active Problem List   Diagnosis Date Noted  . Allergic rhinitis 08/15/2014  . Arthritis of knee 08/15/2014  . Endometriosis 08/15/2014  . HLD (hyperlipidemia) 08/15/2014  . Adult hypothyroidism 08/15/2014  . Adiposity 08/15/2014  . Primary osteoarthritis of knee 07/25/2014  . Mastalgia 02/01/2014  . Diabetes mellitus, type 2 12/27/2007  . Avitaminosis D 12/27/2007  . Deficiency, disaccharidase intestinal 09/18/2005  . Allergic reaction 12/09/1994  . Bloodgood disease 10/19/1990  . Essential (primary) hypertension 10/19/1990    Past Medical History  Diagnosis Date  . Hypertension   . Arthritis     hands  . Murmur   . Complication of anesthesia   . PONV (postoperative nausea and vomiting)   . Diabetes mellitus without complication 3785    diet control    History   Social History  . Marital Status: Widowed    Spouse Name: N/A  . Number of Children: N/A   . Years of Education: N/A   Occupational History  . Not on file.   Social History Main Topics  . Smoking status: Never Smoker   . Smokeless tobacco: Never Used  . Alcohol Use: No  . Drug Use: No  . Sexual Activity: Not on file   Other Topics Concern  . Not on file   Social History Narrative    Outpatient Prescriptions Prior to Visit  Medication Sig Dispense Refill  . acetaminophen (TYLENOL) 500 MG tablet Take by mouth.    Marland Kitchen glucose blood (BAYER CONTOUR NEXT TEST) test strip BAYER CONTOUR NEXT TEST (In Vitro Strip)  1 (one) Strip Strip check sugar once daily for 0 days  Quantity: 50;  Refills: 12   Ordered :14-Nov-2013  Miguel Aschoff MD;  Started 14-Nov-2013 Active Comments: diagnosis code: E11.40    . Multiple Vitamin (MULTIVITAMIN) capsule Take 1 capsule by mouth daily.    . Olmesartan-Amlodipine-HCTZ (TRIBENZOR) 40-10-25 MG TABS Take 1 tablet by mouth daily. 90 tablet 3  . Omega-3 Fatty Acids (FISH OIL) 1200 MG CAPS     . oxyCODONE (OXY IR/ROXICODONE) 5 MG immediate release tablet Take 1-2 tablets (5-10 mg total) by mouth every 3 (three) hours as needed for breakthrough pain. 30 tablet 0  . traMADol (ULTRAM) 50 MG tablet Take by mouth.    . enoxaparin (LOVENOX) 40 MG/0.4ML injection Inject 0.4 mLs (  40 mg total) into the skin daily. 14 Syringe 0   No facility-administered medications prior to visit.    Allergies  Allergen Reactions  . Latex Anaphylaxis    Review of Systems  Respiratory: Negative.   Cardiovascular: Negative.   Musculoskeletal: Positive for joint pain (Just the right knee secondary to surgery. ).  Neurological: Negative for dizziness and headaches.  Psychiatric/Behavioral: Negative.    Objective:  BP 132/64 mmHg  Pulse 80  Temp(Src) 98 F (36.7 C) (Oral)  Resp 16  Ht 5\' 1"  (1.549 m)  Wt 169 lb (76.658 kg)  BMI 31.95 kg/m2  Physical Exam  Constitutional: She is oriented to person, place, and time and well-developed, well-nourished, and  in no distress.  HENT:  Head: Normocephalic and atraumatic.  Right Ear: External ear normal.  Left Ear: External ear normal.  Nose: Nose normal.  Eyes: Conjunctivae are normal.  Neck: Neck supple.  Cardiovascular: Normal rate, regular rhythm and normal heart sounds.   Pulmonary/Chest: Effort normal and breath sounds normal.  Abdominal: Soft.  Neurological: She is alert and oriented to person, place, and time.  Psychiatric: Mood, memory, affect and judgment normal.    Assessment and Plan :  Status post total right knee replacement Patient doing well as she recovers from this. All other issues are stable. Miguel Aschoff MD [facility] 08/29/2014 4:22 PM

## 2014-08-30 DIAGNOSIS — Z96651 Presence of right artificial knee joint: Secondary | ICD-10-CM | POA: Diagnosis not present

## 2014-09-01 DIAGNOSIS — Z96651 Presence of right artificial knee joint: Secondary | ICD-10-CM | POA: Diagnosis not present

## 2014-09-04 DIAGNOSIS — Z96651 Presence of right artificial knee joint: Secondary | ICD-10-CM | POA: Diagnosis not present

## 2014-09-06 ENCOUNTER — Encounter: Payer: Medicare Other | Admitting: Physical Therapy

## 2014-09-06 DIAGNOSIS — Z96651 Presence of right artificial knee joint: Secondary | ICD-10-CM | POA: Diagnosis not present

## 2014-09-08 ENCOUNTER — Encounter: Payer: Self-pay | Admitting: Family Medicine

## 2014-09-08 DIAGNOSIS — Z96651 Presence of right artificial knee joint: Secondary | ICD-10-CM | POA: Diagnosis not present

## 2014-09-11 DIAGNOSIS — Z96651 Presence of right artificial knee joint: Secondary | ICD-10-CM | POA: Diagnosis not present

## 2014-09-13 DIAGNOSIS — Z96651 Presence of right artificial knee joint: Secondary | ICD-10-CM | POA: Diagnosis not present

## 2014-09-15 DIAGNOSIS — Z96651 Presence of right artificial knee joint: Secondary | ICD-10-CM | POA: Diagnosis not present

## 2014-09-18 DIAGNOSIS — Z96651 Presence of right artificial knee joint: Secondary | ICD-10-CM | POA: Diagnosis not present

## 2014-09-20 DIAGNOSIS — Z96651 Presence of right artificial knee joint: Secondary | ICD-10-CM | POA: Diagnosis not present

## 2014-10-12 DIAGNOSIS — E119 Type 2 diabetes mellitus without complications: Secondary | ICD-10-CM | POA: Diagnosis not present

## 2014-10-12 LAB — HM DIABETES EYE EXAM

## 2014-10-13 ENCOUNTER — Encounter: Payer: Self-pay | Admitting: Family Medicine

## 2014-10-23 ENCOUNTER — Ambulatory Visit (INDEPENDENT_AMBULATORY_CARE_PROVIDER_SITE_OTHER): Payer: Medicare Other | Admitting: Family Medicine

## 2014-10-23 ENCOUNTER — Encounter: Payer: Self-pay | Admitting: Family Medicine

## 2014-10-23 VITALS — BP 128/84 | HR 72 | Temp 98.2°F | Resp 12 | Wt 160.0 lb

## 2014-10-23 DIAGNOSIS — Z23 Encounter for immunization: Secondary | ICD-10-CM

## 2014-10-23 DIAGNOSIS — E119 Type 2 diabetes mellitus without complications: Secondary | ICD-10-CM | POA: Diagnosis not present

## 2014-10-23 DIAGNOSIS — M129 Arthropathy, unspecified: Secondary | ICD-10-CM

## 2014-10-23 DIAGNOSIS — M1710 Unilateral primary osteoarthritis, unspecified knee: Secondary | ICD-10-CM

## 2014-10-23 DIAGNOSIS — I10 Essential (primary) hypertension: Secondary | ICD-10-CM

## 2014-10-23 LAB — POCT UA - MICROALBUMIN: Microalbumin Ur, POC: 20 mg/L

## 2014-10-23 LAB — POCT GLYCOSYLATED HEMOGLOBIN (HGB A1C): HEMOGLOBIN A1C: 6.7

## 2014-10-23 NOTE — Progress Notes (Signed)
Patient ID: Cynthia Castillo, female   DOB: 03/13/1940, 74 y.o.   MRN: [id number]    Subjective:  HPI  Hypertension follow up: Patient was last seen in May 2016. Patient checks her B/P and readings are usually around 120s/79-80. No cardiac issues present. Tolerating medication well. BP Readings from Last 3 Encounters:  10/23/14 128/84  08/29/14 132/64  05/23/14 132/68   Diabetes follow up: Patient does check her sugar and readings are around 128 or 130. No hypoglycemic episodes. Patient is not on any medications. She tries to watch her eating habits. Lab Results  Component Value Date   HGBA1C 6.9* 05/23/2014    Arthritis of the knee: Patient is still having hard time with her left knee. Right knee has been operated on before but left has not been. Difficult to walk for her, stiffness in the morning present. She takes Oxycodone 1 tablet daily and Tramadol about 1 tablet every 2 days or so per patient.  Prior to Admission medications   Medication Sig Start Date End Date Taking? Authorizing Provider  glucose blood (BAYER CONTOUR NEXT TEST) test strip BAYER CONTOUR NEXT TEST (In Vitro Strip)  1 (one) Strip Strip check sugar once daily for 0 days  Quantity: 50;  Refills: 12   Ordered :14-Nov-2013  Miguel Aschoff MD;  Started 14-Nov-2013 Active Comments: diagnosis code: E11.40 11/14/13  Yes Historical Provider, MD  Multiple Vitamin (MULTIVITAMIN) capsule Take 1 capsule by mouth daily.   Yes Historical Provider, MD  Olmesartan-Amlodipine-HCTZ (TRIBENZOR) 40-10-25 MG TABS Take 1 tablet by mouth daily. 06/29/14  Yes Richard Maceo Pro., MD  Omega-3 Fatty Acids (FISH OIL) 1200 MG CAPS  10/01/07  Yes Historical Provider, MD  oxyCODONE (OXY IR/ROXICODONE) 5 MG immediate release tablet Take 1-2 tablets (5-10 mg total) by mouth every 3 (three) hours as needed for breakthrough pain. 07/27/14  Yes Duanne Guess, PA-C  traMADol (ULTRAM) 50 MG tablet Take by mouth. 12/19/13  Yes Historical  Provider, MD  acetaminophen (TYLENOL) 500 MG tablet Take by mouth.    Historical Provider, MD    Patient Active Problem List   Diagnosis Date Noted  . Allergic rhinitis 08/15/2014  . Arthritis of knee 08/15/2014  . Endometriosis 08/15/2014  . HLD (hyperlipidemia) 08/15/2014  . Adult hypothyroidism 08/15/2014  . Adiposity 08/15/2014  . Primary osteoarthritis of knee 07/25/2014  . Mastalgia 02/01/2014  . Diabetes mellitus, type 2 ([place]) 12/27/2007  . Avitaminosis D 12/27/2007  . Deficiency, disaccharidase intestinal 09/18/2005  . Allergic reaction 12/09/1994  . Bloodgood disease 10/19/1990  . Essential (primary) hypertension 10/19/1990    Past Medical History  Diagnosis Date  . Hypertension   . Arthritis     hands  . Murmur   . Complication of anesthesia   . PONV (postoperative nausea and vomiting)   . Diabetes mellitus without complication ([place]) 8088    diet control    Social History   Social History  . Marital Status: Widowed    Spouse Name: N/A  . Number of Children: N/A  . Years of Education: N/A   Occupational History  . Not on file.   Social History Main Topics  . Smoking status: Never Smoker   . Smokeless tobacco: Never Used  . Alcohol Use: No  . Drug Use: No  . Sexual Activity: No   Other Topics Concern  . Not on file   Social History Narrative    Allergies  Allergen Reactions  . Latex Anaphylaxis    Review of  Systems  Constitutional: Negative.   Eyes: Negative.   Respiratory: Negative.   Cardiovascular: Negative.   Gastrointestinal: Negative.   Musculoskeletal: Positive for joint pain. Negative for myalgias, falls and neck pain.  Skin: Negative.   Neurological: Negative.   Endo/Heme/Allergies: Negative.   Psychiatric/Behavioral: Negative.     Immunization History  Administered Date(s) Administered  . Pneumococcal Conjugate-13 08/16/2013  . Pneumococcal Polysaccharide-23 11/20/2010  . Td 08/25/2003  . Tdap 10/20/2012  . Zoster  10/20/2012   Objective:  BP 128/84 mmHg  Pulse 72  Temp(Src) 98.2 F (36.8 C)  Resp 12  Wt 160 lb (72.576 kg)  Physical Exam  Constitutional: She is oriented to person, place, and time and well-developed, well-nourished, and in no distress.  HENT:  Head: Normocephalic and atraumatic.  Right Ear: External ear normal.  Left Ear: External ear normal.  Nose: Nose normal.  Eyes: Conjunctivae are normal.  Neck: Normal range of motion. Neck supple.  Cardiovascular: Normal rate, regular rhythm and normal heart sounds.   Pulmonary/Chest: Effort normal and breath sounds normal.  Abdominal: Soft.  Musculoskeletal:  Walks with a  cane from her chronic knee pain  Neurological: She is alert and oriented to person, place, and time.  Skin: Skin is warm and dry.  Psychiatric: Mood, memory, affect and judgment normal.   normal monofilament exam  Lab Results  Component Value Date   WBC 12.4* 07/27/2014   HGB 11.3* 07/27/2014   HCT 34.6* 07/27/2014   PLT 359 07/27/2014   GLUCOSE 212* 07/26/2014   CHOL 183 05/26/2014   TRIG 103 05/26/2014   HDL 60 05/26/2014   LDLCALC 102 05/26/2014   TSH 2.56 05/26/2014   INR 0.96 07/12/2014   HGBA1C 6.9* 05/23/2014    CMP     Component Value Date/Time   NA 139 07/26/2014 0505   NA 142 05/26/2014   K 3.5 07/26/2014 0505   CL 109 07/26/2014 0505   CO2 23 07/26/2014 0505   GLUCOSE 212* 07/26/2014 0505   BUN 11 07/26/2014 0505   CREATININE 0.91 07/26/2014 0505   CREATININE 0.9 05/26/2014   CALCIUM 8.9 07/26/2014 0505   AST 16 05/26/2014   ALT 14 05/26/2014   ALKPHOS 62 05/26/2014   GFRNONAA >60 07/26/2014 0505   GFRAA >60 07/26/2014 0505    Assessment and Plan :  1. Essential (primary) hypertension controlled   2. Type 2 diabetes mellitus without complication, without long-term current use of insulin (HCC)  - POCT HgB A1C--6.7 today==good control - POCT UA - Microalbumin--20  3. Arthritis of knee   4. Need for influenza  vaccination  - Flu vaccine HIGH DOSE PF (Fluzone High dose) 5. Diabetic nephropathy Patient spilling small amount of protein but is on ACE inhibitor 6. Osteoarthritis--limit NSAIDs 7. Obesity Miguel Aschoff MD [facility] 10/23/2014 10:02 AM

## 2014-12-20 ENCOUNTER — Encounter: Payer: Self-pay | Admitting: *Deleted

## 2015-01-01 ENCOUNTER — Encounter: Payer: Self-pay | Admitting: Family Medicine

## 2015-01-01 ENCOUNTER — Ambulatory Visit (INDEPENDENT_AMBULATORY_CARE_PROVIDER_SITE_OTHER): Payer: Medicare Other | Admitting: Family Medicine

## 2015-01-01 VITALS — BP 122/60 | HR 92 | Temp 98.4°F | Resp 16 | Wt 162.0 lb

## 2015-01-01 DIAGNOSIS — M129 Arthropathy, unspecified: Secondary | ICD-10-CM

## 2015-01-01 DIAGNOSIS — M1710 Unilateral primary osteoarthritis, unspecified knee: Secondary | ICD-10-CM

## 2015-01-01 DIAGNOSIS — M1990 Unspecified osteoarthritis, unspecified site: Secondary | ICD-10-CM | POA: Diagnosis not present

## 2015-01-01 NOTE — Patient Instructions (Signed)
Try Glucosamine- Chondroitin OTC. Go to your pharmacy and ask for this

## 2015-01-01 NOTE — Progress Notes (Signed)
Patient ID: Cynthia Castillo, female   DOB: [birth year], 74 y.o.   MRN: [id number]    Subjective:  HPI Pt is here today for arthritis. She has had a total right knee replacement and will need another for the left knee. She has tried tylenol arthritis in the past but has not tried it recently. Pt would like to know if there is something else she can take. Also she would like a handicapped sticker due to this.  Prior to Admission medications   Medication Sig Start Date End Date Taking? Authorizing Provider  glucose blood (BAYER CONTOUR NEXT TEST) test strip BAYER CONTOUR NEXT TEST (In Vitro Strip)  1 (one) Strip Strip check sugar once daily for 0 days  Quantity: 50;  Refills: 12   Ordered :14-Nov-2013  Miguel Aschoff MD;  Started 14-Nov-2013 Active Comments: diagnosis code: E11.40 11/14/13  Yes Historical Provider, MD  Multiple Vitamin (MULTIVITAMIN) capsule Take 1 capsule by mouth daily.   Yes Historical Provider, MD  Olmesartan-Amlodipine-HCTZ (TRIBENZOR) 40-10-25 MG TABS Take 1 tablet by mouth daily. 06/29/14  Yes Richard Maceo Pro., MD  Omega-3 Fatty Acids (FISH OIL) 1200 MG CAPS  10/01/07  Yes Historical Provider, MD    Patient Active Problem List   Diagnosis Date Noted  . Arthritis 01/01/2015  . Allergic rhinitis 08/15/2014  . Arthritis of knee 08/15/2014  . Endometriosis 08/15/2014  . HLD (hyperlipidemia) 08/15/2014  . Adult hypothyroidism 08/15/2014  . Adiposity 08/15/2014  . Primary osteoarthritis of knee 07/25/2014  . Mastalgia 02/01/2014  . Diabetes mellitus, type 2 ([place]) 12/27/2007  . Avitaminosis D 12/27/2007  . Deficiency, disaccharidase intestinal 09/18/2005  . Allergic reaction 12/09/1994  . Bloodgood disease 10/19/1990  . Essential (primary) hypertension 10/19/1990    Past Medical History  Diagnosis Date  . Hypertension   . Arthritis     hands  . Murmur   . Complication of anesthesia   . PONV (postoperative nausea and vomiting)   . Diabetes mellitus  without complication ([place]) 123456    diet control    Social History   Social History  . Marital Status: Widowed    Spouse Name: N/A  . Number of Children: N/A  . Years of Education: N/A   Occupational History  . Not on file.   Social History Main Topics  . Smoking status: Never Smoker   . Smokeless tobacco: Never Used  . Alcohol Use: No  . Drug Use: No  . Sexual Activity: No   Other Topics Concern  . Not on file   Social History Narrative    Allergies  Allergen Reactions  . Latex Anaphylaxis    Review of Systems  Constitutional: Negative.   HENT: Negative.   Respiratory: Negative.   Cardiovascular: Negative.   Gastrointestinal: Negative.   Genitourinary: Negative.   Musculoskeletal: Positive for joint pain.  Skin: Negative.   Neurological: Negative.   Endo/Heme/Allergies: Negative.   Psychiatric/Behavioral: Negative.     Immunization History  Administered Date(s) Administered  . Influenza, High Dose Seasonal PF 10/23/2014  . Pneumococcal Conjugate-13 08/16/2013  . Pneumococcal Polysaccharide-23 11/20/2010  . Td 08/25/2003  . Tdap 10/20/2012  . Zoster 10/20/2012   Objective:  BP 122/60 mmHg  Pulse 92  Temp(Src) 98.4 F (36.9 C) (Oral)  Resp 16  Wt 162 lb (73.483 kg)  Physical Exam  Constitutional: She is oriented to person, place, and time and well-developed, well-nourished, and in no distress.  Eyes: Conjunctivae and EOM are normal. Pupils are equal, round, and  reactive to light.  Neck: Normal range of motion. Neck supple.  Cardiovascular: Normal rate, regular rhythm, normal heart sounds and intact distal pulses.   Pulmonary/Chest: Effort normal and breath sounds normal.  Abdominal: Soft. Bowel sounds are normal.  Musculoskeletal: Normal range of motion.  Neurological: She is alert and oriented to person, place, and time. She has normal reflexes. Gait normal. GCS score is 15.  Skin: Skin is warm and dry.  Psychiatric: Mood, memory, affect and  judgment normal.    Lab Results  Component Value Date   WBC 12.4* 07/27/2014   HGB 11.3* 07/27/2014   HCT 34.6* 07/27/2014   PLT 359 07/27/2014   GLUCOSE 212* 07/26/2014   CHOL 183 05/26/2014   TRIG 103 05/26/2014   HDL 60 05/26/2014   LDLCALC 102 05/26/2014   TSH 2.56 05/26/2014   INR 0.96 07/12/2014   HGBA1C 6.7 10/23/2014    CMP     Component Value Date/Time   NA 139 07/26/2014 0505   NA 142 05/26/2014   K 3.5 07/26/2014 0505   CL 109 07/26/2014 0505   CO2 23 07/26/2014 0505   GLUCOSE 212* 07/26/2014 0505   BUN 11 07/26/2014 0505   CREATININE 0.91 07/26/2014 0505   CREATININE 0.9 05/26/2014   CALCIUM 8.9 07/26/2014 0505   AST 16 05/26/2014   ALT 14 05/26/2014   ALKPHOS 62 05/26/2014   GFRNONAA >60 07/26/2014 0505   GFRAA >60 07/26/2014 0505    Assessment and Plan :  1. Arthritis Try Glucosamine- Chondroitin, if that does not help will need to see ortho for possible left knee replacement.   2. Arthritis of knee 3.Mild Obesity 4.TIIDM I have done the exam and reviewed the above chart and it is accurate to the best of my knowledge.  Patient was seen and examined by Dr. Miguel Aschoff, and noted scribed by Webb Laws, [place] MD [facility] 01/01/2015 4:35 PM

## 2015-01-31 ENCOUNTER — Encounter: Payer: Self-pay | Admitting: General Surgery

## 2015-01-31 ENCOUNTER — Ambulatory Visit (INDEPENDENT_AMBULATORY_CARE_PROVIDER_SITE_OTHER): Payer: Medicare Other | Admitting: General Surgery

## 2015-01-31 VITALS — BP 130/72 | HR 72 | Resp 12 | Ht 61.0 in | Wt 163.0 lb

## 2015-01-31 DIAGNOSIS — Z1211 Encounter for screening for malignant neoplasm of colon: Secondary | ICD-10-CM | POA: Insufficient documentation

## 2015-01-31 MED ORDER — POLYETHYLENE GLYCOL 3350 17 GM/SCOOP PO POWD: ORAL | Status: DC

## 2015-01-31 MED ORDER — AMOXICILLIN 500 MG PO TABS
500.0000 mg | ORAL_TABLET | Freq: Two times a day (BID) | ORAL | Status: DC
Start: 2015-01-31 — End: 2015-03-14

## 2015-01-31 NOTE — Patient Instructions (Addendum)
Colonoscopy A colonoscopy is an exam to look at the entire large intestine (colon). This exam can help find problems such as tumors, polyps, inflammation, and areas of bleeding. The exam takes about 1 hour.  LET [facility] CARE PROVIDER KNOW ABOUT:   Any allergies you have.  All medicines you are taking, including vitamins, herbs, eye drops, creams, and over-the-counter medicines.  Previous problems you or members of your family have had with the use of anesthetics.  Any blood disorders you have.  Previous surgeries you have had.  Medical conditions you have. RISKS AND COMPLICATIONS  Generally, this is a safe procedure. However, as with any procedure, complications can occur. Possible complications include:  Bleeding.  Tearing or rupture of the colon wall.  Reaction to medicines given during the exam.  Infection (rare). BEFORE THE PROCEDURE   Ask your health care provider about changing or stopping your regular medicines.  You may be prescribed an oral bowel prep. This involves drinking a large amount of medicated liquid, starting the day before your procedure. The liquid will cause you to have multiple loose stools until your stool is almost clear or light green. This cleans out your colon in preparation for the procedure.  Do not eat or drink anything else once you have started the bowel prep, unless your health care provider tells you it is safe to do so.  Arrange for someone to drive you home after the procedure. PROCEDURE   You will be given medicine to help you relax (sedative).  You will lie on your side with your knees bent.  A long, flexible tube with a light and camera on the end (colonoscope) will be inserted through the rectum and into the colon. The camera sends video back to a computer screen as it moves through the colon. The colonoscope also releases carbon dioxide gas to inflate the colon. This helps your health care provider see the area better.  During  the exam, your health care provider may take a small tissue sample (biopsy) to be examined under a microscope if any abnormalities are found.  The exam is finished when the entire colon has been viewed. AFTER THE PROCEDURE   Do not drive for 24 hours after the exam.  You may have a small amount of blood in your stool.  You may pass moderate amounts of gas and have mild abdominal cramping or bloating. This is caused by the gas used to inflate your colon during the exam.  Ask when your test results will be ready and how you will get your results. Make sure you get your test results.   This information is not intended to replace advice given to you by your health care provider. Make sure you discuss any questions you have with your health care provider.   Document Released: 01/04/2000 Document Revised: 10/27/2012 Document Reviewed: 09/13/2012 Elsevier Interactive Patient Education [employer].   Patient has been scheduled for a colonoscopy on 03-21-15 at [facility]. This patient has been asked to discontinue fish oil one week prior to procedure. She has also been asked to take amoxicillin 500 mg 4 tablets one hour prior to procedure.

## 2015-01-31 NOTE — H&P (Signed)
HPI  Cynthia Castillo is a 75 y.o. female here today for a evaluation of a screening colonoscopy. Last colonoscopy was done on 10/2005. Patient states no GI problems at this time.  The patient underwent right knee replacement in July 2016 with good results. She is still making use of a cane due to left knee pain.  I personally reviewed the patient's history.  HPI  Past Medical History   Diagnosis  Date   .  Hypertension    .  Arthritis      hands   .  Murmur    .  Complication of anesthesia    .  PONV (postoperative nausea and vomiting)    .  Diabetes mellitus without complication ([place])  123456     diet control    Past Surgical History   Procedure  Laterality  Date   .  Breast cyst excision  Left  1972   .  Abdominal hysterectomy   1972   .  Colonoscopy   2007     Dr Bary Castilla   .  Total knee arthroplasty  Right  07/25/2014     Procedure: TOTAL KNEE ARTHROPLASTY; Surgeon: Hessie Knows, MD; Location: [facility] ORS; Service: Orthopedics; Laterality: Right;   .  Appendectomy      Family History   Problem  Relation  Age of Onset   .  Stroke  Mother    .  Heart attack  Father    .  Cancer  Sister  64     stomach/[place]   .  Cancer  Sister  75     breast/Rosa Graves   .  Cancer  Maternal Grandmother      unknown    Social History  Social History   Substance Use Topics   .  Smoking status:  Never Smoker   .  Smokeless tobacco:  Never Used   .  Alcohol Use:  No    Allergies   Allergen  Reactions   .  Latex  Anaphylaxis    Current Outpatient Prescriptions   Medication  Sig  Dispense  Refill   .  Multiple Vitamin (MULTIVITAMIN) capsule  Take 1 capsule by mouth daily.     .  Olmesartan-Amlodipine-HCTZ (TRIBENZOR) 40-10-25 MG TABS  Take 1 tablet by mouth daily.  90 tablet  3   .  Omega-3 Fatty Acids (FISH OIL) 1200 MG CAPS      .  amoxicillin (AMOXIL) 500 MG tablet  Take 1 tablet (500 mg total) by mouth 2 (two) times daily.  4 tablet  0   .  glucose blood (BAYER CONTOUR NEXT TEST) test  strip  Reported on 01/31/2015     .  polyethylene glycol powder (GLYCOLAX/MIRALAX) powder  255 grams one bottle for colonoscopy prep  255 g  0    No current facility-administered medications for this visit.    Review of Systems  Review of Systems  Constitutional: Negative.  Respiratory: Negative.  Cardiovascular: Negative.   Blood pressure 130/72, pulse 72, resp. rate 12, height 5\' 1"  (1.549 m), weight 163 lb (73.936 kg).  Physical Exam  Physical Exam  Constitutional: She is oriented to person, place, and time. She appears well-developed and well-nourished.  Cardiovascular: Normal rate and regular rhythm.  Murmur heard.  Systolic murmur is present with a grade of 2/6  Pulmonary/Chest: Effort normal and breath sounds normal.  Neurological: She is alert and oriented to person, place, and time.  Skin: Skin is warm and  dry.   Data Reviewed  *2007 colonoscopy reviewed. Normal study.  Assessment   Candidate for screening colonoscopy.   Plan   The patient will make use of amoxicillin 2 g one hour prior to the procedure to minimize bacteremia during the procedure with her recently completed knee prosthesis.  Colonoscopy with possible biopsy/polypectomy prn: Information regarding the procedure, including its potential risks and complications (including but not limited to perforation of the bowel, which may require emergency surgery to repair, and bleeding) was verbally given to the patient. Educational information regarding lower intestinal endoscopy was given to the patient. Written instructions for how to complete the bowel prep using Miralax were provided. The importance of drinking ample fluids to avoid dehydration as a result of the prep emphasized.  Patient has been scheduled for a colonoscopy on 03-21-15 at [facility]. This patient has been asked to discontinue fish oil one week prior to procedure. She has also been asked to take amoxicillin 500 mg 4 tablets one hour prior to procedure.  PCP:  Cranford Mon, Richard L  This information has been scribed by Gaspar Cola CMA.  Robert Bellow

## 2015-01-31 NOTE — Progress Notes (Signed)
Patient ID: Cynthia Castillo, female   DOB: [birth year], 75 y.o.   MRN: [id number]  Chief Complaint  Patient presents with  . Colonoscopy    HPI Cynthia Castillo is a 75 y.o. female here today for a evaluation of a screening colonoscopy. Last colonoscopy was done on 10/2005. Patient states no GI problems at this time.   The patient underwent right knee replacement in July 2016 with good results. She is still making use of a cane due to left knee pain.  I personally reviewed the patient's history. HPI  Past Medical History  Diagnosis Date  . Hypertension   . Arthritis     hands  . Murmur   . Complication of anesthesia   . PONV (postoperative nausea and vomiting)   . Diabetes mellitus without complication ([place]) 123456    diet control    Past Surgical History  Procedure Laterality Date  . Breast cyst excision Left 1972  . Abdominal hysterectomy  1972  . Colonoscopy  2007    Dr Bary Castilla  . Total knee arthroplasty Right 07/25/2014    Procedure: TOTAL KNEE ARTHROPLASTY;  Surgeon: Hessie Knows, MD;  Location: [facility] ORS;  Service: Orthopedics;  Laterality: Right;  . Appendectomy      Family History  Problem Relation Age of Onset  . Stroke Mother   . Heart attack Father   . Cancer Sister 59    stomach/Cynthia Castillo  . Cancer Sister 35    breast/Cynthia Castillo  . Cancer Maternal Grandmother     unknown    Social History Social History  Substance Use Topics  . Smoking status: Never Smoker   . Smokeless tobacco: Never Used  . Alcohol Use: No    Allergies  Allergen Reactions  . Latex Anaphylaxis    Current Outpatient Prescriptions  Medication Sig Dispense Refill  . Multiple Vitamin (MULTIVITAMIN) capsule Take 1 capsule by mouth daily.    . Olmesartan-Amlodipine-HCTZ (TRIBENZOR) 40-10-25 MG TABS Take 1 tablet by mouth daily. 90 tablet 3  . Omega-3 Fatty Acids (FISH OIL) 1200 MG CAPS     . amoxicillin (AMOXIL) 500 MG tablet Take 1 tablet (500 mg total) by mouth 2 (two) times daily. 4  tablet 0  . glucose blood (BAYER CONTOUR NEXT TEST) test strip Reported on 01/31/2015    . polyethylene glycol powder (GLYCOLAX/MIRALAX) powder 255 grams one bottle for colonoscopy prep 255 g 0   No current facility-administered medications for this visit.    Review of Systems Review of Systems  Constitutional: Negative.   Respiratory: Negative.   Cardiovascular: Negative.     Blood pressure 130/72, pulse 72, resp. rate 12, height 5\' 1"  (1.549 m), weight 163 lb (73.936 kg).  Physical Exam Physical Exam  Constitutional: She is oriented to person, place, and time. She appears well-developed and well-nourished.  Cardiovascular: Normal rate and regular rhythm.   Murmur heard.  Systolic murmur is present with a grade of 2/6  Pulmonary/Chest: Effort normal and breath sounds normal.  Neurological: She is alert and oriented to person, place, and time.  Skin: Skin is warm and dry.    Data Reviewed *2007 colonoscopy reviewed. Normal study.  Assessment    Candidate for screening colonoscopy.    Plan    The patient will make use of amoxicillin 2 g one hour prior to the procedure to minimize bacteremia during the procedure with her recently completed knee prosthesis.     Colonoscopy with possible biopsy/polypectomy prn: Information regarding the procedure, including its  potential risks and complications (including but not limited to perforation of the bowel, which may require emergency surgery to repair, and bleeding) was verbally given to the patient. Educational information regarding lower intestinal endoscopy was given to the patient. Written instructions for how to complete the bowel prep using Miralax were provided. The importance of drinking ample fluids to avoid dehydration as a result of the prep emphasized.  Patient has been scheduled for a colonoscopy on 03-21-15 at [facility]. This patient has been asked to discontinue fish oil one week prior to procedure. She has also been asked to  take amoxicillin 500 mg 4 tablets one hour prior to procedure.   PCP:  Cranford Mon, Richard L This information has been scribed by Gaspar Cola CMA.    Robert Bellow 01/31/2015, 8:30 PM

## 2015-02-27 ENCOUNTER — Ambulatory Visit: Payer: Medicare Other | Admitting: Family Medicine

## 2015-03-14 ENCOUNTER — Telehealth: Payer: Self-pay | Admitting: *Deleted

## 2015-03-14 NOTE — Telephone Encounter (Signed)
Patient contacted today and medications were reviewed. All medications were the same except patient is no longer taking amoxicillin. Medication list updated accordingly. Patient reports she has picked up Miralax prescription.   We will proceed with colonoscopy as scheduled for 03-21-15 at [facility].   This patient was instructed to contact the office if she has further questions.

## 2015-03-20 ENCOUNTER — Encounter: Payer: Self-pay | Admitting: *Deleted

## 2015-03-21 ENCOUNTER — Encounter: Payer: Self-pay | Admitting: *Deleted

## 2015-03-21 ENCOUNTER — Ambulatory Visit
Admission: RE | Admit: 2015-03-21 | Discharge: 2015-03-21 | Disposition: A | Discharge status: Home / Self Care | Payer: Medicare Other | Source: Ambulatory Visit | Attending: General Surgery | Admitting: General Surgery

## 2015-03-21 ENCOUNTER — Ambulatory Visit: Payer: Medicare Other | Admitting: Anesthesiology

## 2015-03-21 ENCOUNTER — Encounter
Admission: RE | Disposition: A | Discharge status: Home / Self Care | Payer: Self-pay | Source: Ambulatory Visit | Attending: General Surgery

## 2015-03-21 DIAGNOSIS — K635 Polyp of colon: Secondary | ICD-10-CM | POA: Diagnosis not present

## 2015-03-21 DIAGNOSIS — Z87892 Personal history of anaphylaxis: Secondary | ICD-10-CM | POA: Diagnosis not present

## 2015-03-21 DIAGNOSIS — Z90710 Acquired absence of both cervix and uterus: Secondary | ICD-10-CM | POA: Insufficient documentation

## 2015-03-21 DIAGNOSIS — E119 Type 2 diabetes mellitus without complications: Secondary | ICD-10-CM | POA: Diagnosis not present

## 2015-03-21 DIAGNOSIS — M19041 Primary osteoarthritis, right hand: Secondary | ICD-10-CM | POA: Insufficient documentation

## 2015-03-21 DIAGNOSIS — Z9889 Other specified postprocedural states: Secondary | ICD-10-CM | POA: Insufficient documentation

## 2015-03-21 DIAGNOSIS — Z9049 Acquired absence of other specified parts of digestive tract: Secondary | ICD-10-CM | POA: Diagnosis not present

## 2015-03-21 DIAGNOSIS — Z1211 Encounter for screening for malignant neoplasm of colon: Secondary | ICD-10-CM | POA: Insufficient documentation

## 2015-03-21 DIAGNOSIS — Z91040 Latex allergy status: Secondary | ICD-10-CM | POA: Insufficient documentation

## 2015-03-21 DIAGNOSIS — I10 Essential (primary) hypertension: Secondary | ICD-10-CM | POA: Insufficient documentation

## 2015-03-21 DIAGNOSIS — Z79899 Other long term (current) drug therapy: Secondary | ICD-10-CM | POA: Diagnosis not present

## 2015-03-21 DIAGNOSIS — M19042 Primary osteoarthritis, left hand: Secondary | ICD-10-CM | POA: Insufficient documentation

## 2015-03-21 DIAGNOSIS — N181 Chronic kidney disease, stage 1: Secondary | ICD-10-CM

## 2015-03-21 DIAGNOSIS — D125 Benign neoplasm of sigmoid colon: Secondary | ICD-10-CM | POA: Insufficient documentation

## 2015-03-21 DIAGNOSIS — E1122 Type 2 diabetes mellitus with diabetic chronic kidney disease: Secondary | ICD-10-CM

## 2015-03-21 HISTORY — PX: COLONOSCOPY WITH PROPOFOL: SHX5780

## 2015-03-21 LAB — GLUCOSE, CAPILLARY: Glucose-Capillary: 131 mg/dL — ABNORMAL HIGH (ref 65–99)

## 2015-03-21 SURGERY — COLONOSCOPY WITH PROPOFOL
Anesthesia: General

## 2015-03-21 MED ORDER — PROPOFOL 500 MG/50ML IV EMUL
INTRAVENOUS | Status: DC | PRN
  Administered 2015-03-21: 140 ug/kg/min via INTRAVENOUS

## 2015-03-21 MED ORDER — FENTANYL CITRATE (PF) 100 MCG/2ML IJ SOLN
INTRAMUSCULAR | Status: DC | PRN
  Administered 2015-03-21: 50 ug via INTRAVENOUS

## 2015-03-21 MED ORDER — MIDAZOLAM HCL 5 MG/5ML IJ SOLN
INTRAMUSCULAR | Status: DC | PRN
  Administered 2015-03-21: 1 mg via INTRAVENOUS

## 2015-03-21 MED ORDER — SODIUM CHLORIDE 0.9 % IV SOLN
INTRAVENOUS | Status: DC
  Administered 2015-03-21: 1000 mL via INTRAVENOUS
  Administered 2015-03-21: 10:00:00 via INTRAVENOUS

## 2015-03-21 MED ORDER — PROPOFOL 10 MG/ML IV BOLUS
INTRAVENOUS | Status: DC | PRN
  Administered 2015-03-21: 36.8 mg via INTRAVENOUS

## 2015-03-21 NOTE — Op Note (Signed)
[facility] Gastroenterology Patient Name: Cynthia Castillo Procedure Date: 03/21/2015 10:22 AM MRN: [id number] Account #: [id number] Date of Birth: November 13, 1940 Admit Type: Outpatient Age: 75 Room: [facility] ENDO ROOM 1 Gender: Female Note Status: Finalized Procedure:            Colonoscopy Indications:          Screening for colorectal malignant neoplasm Providers:            Robert Bellow, MD Referring MD:         Janine Ores. Rosanna Randy, MD (Referring MD) Medicines:            Monitored Anesthesia Care Complications:        No immediate complications. Procedure:            Pre-Anesthesia Assessment:                       - Prior to the procedure, a History and Physical was                        performed, and patient medications, allergies and                        sensitivities were reviewed. The patient's tolerance of                        previous anesthesia was reviewed.                       - The risks and benefits of the procedure and the                        sedation options and risks were discussed with the                        patient. All questions were answered and informed                        consent was obtained.                       After obtaining informed consent, the colonoscope was                        passed under direct vision. Throughout the procedure,                        the patient's blood pressure, pulse, and oxygen                        saturations were monitored continuously. The                        Colonoscope was introduced through the anus and                        advanced to the the terminal ileum. The colonoscopy was                        performed without difficulty. The patient tolerated the  procedure well. The quality of the bowel preparation                        was excellent. Findings:      A 8 mm polyp was found in the proximal sigmoid colon. The polyp was       sessile. The polyp was  removed with a hot snare. Resection and retrieval       were complete.      The retroflexed view of the distal rectum and anal verge was normal and       showed no anal or rectal abnormalities. Impression:           - One 8 mm polyp in the proximal sigmoid colon, removed                        with a hot snare. Resected and retrieved.                       - The distal rectum and anal verge are normal on                        retroflexion view. Recommendation:       - Telephone endoscopist for pathology results in 1 week. Procedure Code(s):    --- Professional ---                       [phone], Colonoscopy, flexible; with removal of tumor(s),                        polyp(s), or other lesion(s) by snare technique Diagnosis Code(s):    --- Professional ---                       Z12.11, Encounter for screening for malignant neoplasm                        of colon                       D12.5, Benign neoplasm of sigmoid colon CPT copyright 2016 American Medical Association. All rights reserved. The codes documented in this report are preliminary and upon coder review may  be revised to meet current compliance requirements. Robert Bellow, MD 03/21/2015 10:47:14 AM This report has been signed electronically. Number of Addenda: 0 Note Initiated On: 03/21/2015 10:22 AM Scope Withdrawal Time: 0 hours 11 minutes 33 seconds  Total Procedure Duration: 0 hours 17 minutes 24 seconds       [facility]

## 2015-03-21 NOTE — Transfer of Care (Signed)
Immediate Anesthesia Transfer of Care Note  Patient: Cynthia Castillo  Procedure(s) Performed: Procedure(s): COLONOSCOPY WITH PROPOFOL (N/A)  Patient Location: PACU  Anesthesia Type:General  Level of Consciousness: sedated  Airway & Oxygen Therapy: Patient Spontanous Breathing  Post-op Assessment: Report given to RN  Post vital signs: Reviewed  Last Vitals:  Filed Vitals:   03/21/15 0945 03/21/15 1049  BP: 162/69   Pulse: 108 71  Temp: 37.2 C 35.9 C  Resp: 18 18    Complications: No apparent anesthesia complications

## 2015-03-21 NOTE — H&P (Signed)
Cynthia Castillo [id number] 1940-11-07      HPI: Candidate for screening colonoscopy. Patient tolerated prep well. No fall in BS by her report. She did use Amoxacillin, 2 gm po prior to coming to endoscopy today.    Prescriptions prior to admission  Medication Sig Dispense Refill Last Dose  . Olmesartan-Amlodipine-HCTZ (TRIBENZOR) 40-10-25 MG TABS Take 1 tablet by mouth daily. 90 tablet 3 03/21/2015 at 0900  . glucose blood (BAYER CONTOUR NEXT TEST) test strip Reported on 01/31/2015   Not Taking  . Multiple Vitamin (MULTIVITAMIN) capsule Take 1 capsule by mouth daily.   Taking  . Omega-3 Fatty Acids (FISH OIL) 1200 MG CAPS    Taking  . polyethylene glycol powder (GLYCOLAX/MIRALAX) powder 255 grams one bottle for colonoscopy prep 255 g 0    Allergies  Allergen Reactions  . Latex Anaphylaxis   Past Medical History  Diagnosis Date  . Hypertension   . Arthritis     hands  . Murmur   . Complication of anesthesia   . PONV (postoperative nausea and vomiting)   . Diabetes mellitus without complication ([place]) 123456    diet control   Past Surgical History  Procedure Laterality Date  . Breast cyst excision Left 1972  . Abdominal hysterectomy  1972  . Colonoscopy  2007    Dr Bary Castilla  . Total knee arthroplasty Right 07/25/2014    Procedure: TOTAL KNEE ARTHROPLASTY;  Surgeon: Hessie Knows, MD;  Location: [facility] ORS;  Service: Orthopedics;  Laterality: Right;  . Appendectomy     Social History   Social History  . Marital Status: Widowed    Spouse Name: N/A  . Number of Children: N/A  . Years of Education: N/A   Occupational History  . Not on file.   Social History Main Topics  . Smoking status: Never Smoker   . Smokeless tobacco: Never Used  . Alcohol Use: No  . Drug Use: No  . Sexual Activity: No   Other Topics Concern  . Not on file   Social History Narrative   Social History   Social History Narrative     ROS: Negative.     PE: HEENT: Negative. Lungs:  Clear. Cardio: RR. II/VI SM, unchanged.  Robert Bellow 03/21/2015   Assessment/Plan:  Proceed with planned endoscopy.

## 2015-03-21 NOTE — Anesthesia Preprocedure Evaluation (Addendum)
Anesthesia Evaluation  Patient identified by MRN, date of birth, ID band Patient awake    Reviewed: Allergy & Precautions, NPO status , Patient's Chart, lab work & pertinent test results  History of Anesthesia Complications (+) PONV and PROLONGED EMERGENCE  Airway Mallampati: III       Dental  (+) Poor Dentition   Pulmonary neg pulmonary ROS,           Cardiovascular hypertension, Pt. on medications + Valvular Problems/Murmurs (hx of murmur, no tx)      Neuro/Psych negative neurological ROS     GI/Hepatic negative GI ROS, Neg liver ROS,   Endo/Other  diabetes, Type 2, Oral Hypoglycemic AgentsHypothyroidism   Renal/GU negative Renal ROS     Musculoskeletal  (+) Arthritis , Osteoarthritis,    Abdominal   Peds  Hematology   Anesthesia Other Findings   Reproductive/Obstetrics                            Anesthesia Physical Anesthesia Plan  ASA: II  Anesthesia Plan: General   Post-op Pain Management:    Induction: Intravenous  Airway Management Planned: Nasal Cannula  Additional Equipment:   Intra-op Plan:   Post-operative Plan:   Informed Consent: I have reviewed the patients History and Physical, chart, labs and discussed the procedure including the risks, benefits and alternatives for the proposed anesthesia with the patient or authorized representative who has indicated his/her understanding and acceptance.     Plan Discussed with:   Anesthesia Plan Comments:         Anesthesia Quick Evaluation

## 2015-03-21 NOTE — Anesthesia Postprocedure Evaluation (Signed)
Anesthesia Post Note  Patient: Cynthia Castillo  Procedure(s) Performed: Procedure(s) (LRB): COLONOSCOPY WITH PROPOFOL (N/A)  Patient location during evaluation: Endoscopy Anesthesia Type: General Level of consciousness: awake and alert Pain management: pain level controlled Vital Signs Assessment: post-procedure vital signs reviewed and stable Respiratory status: spontaneous breathing and respiratory function stable Cardiovascular status: stable Anesthetic complications: no    Last Vitals:  Filed Vitals:   03/21/15 0945 03/21/15 1049  BP: 162/69   Pulse: 108 71  Temp: 37.2 C 35.9 C  Resp: 18 18    Last Pain: There were no vitals filed for this visit.               KEPHART,WILLIAM K

## 2015-03-22 ENCOUNTER — Encounter: Payer: Self-pay | Admitting: General Surgery

## 2015-03-22 LAB — SURGICAL PATHOLOGY

## 2015-03-24 ENCOUNTER — Telehealth: Payer: Self-pay | Admitting: General Surgery

## 2015-03-24 NOTE — Telephone Encounter (Signed)
Patient was notified that the biopsy results were benign but a follow-up exam in 5 years would be appropriate.

## 2015-03-29 DIAGNOSIS — M1712 Unilateral primary osteoarthritis, left knee: Secondary | ICD-10-CM | POA: Diagnosis not present

## 2015-03-29 DIAGNOSIS — M25562 Pain in left knee: Secondary | ICD-10-CM | POA: Diagnosis not present

## 2015-03-30 ENCOUNTER — Other Ambulatory Visit: Payer: Self-pay | Admitting: Orthopedic Surgery

## 2015-03-30 DIAGNOSIS — M1712 Unilateral primary osteoarthritis, left knee: Secondary | ICD-10-CM

## 2015-04-05 ENCOUNTER — Ambulatory Visit
Admission: RE | Admit: 2015-04-05 | Discharge: 2015-04-05 | Disposition: A | Discharge status: Home / Self Care | Payer: Medicare Other | Source: Ambulatory Visit | Attending: Orthopedic Surgery | Admitting: Orthopedic Surgery

## 2015-04-05 DIAGNOSIS — M1712 Unilateral primary osteoarthritis, left knee: Secondary | ICD-10-CM

## 2015-04-05 DIAGNOSIS — Z01818 Encounter for other preprocedural examination: Secondary | ICD-10-CM | POA: Diagnosis not present

## 2015-04-05 DIAGNOSIS — M8580 Other specified disorders of bone density and structure, unspecified site: Secondary | ICD-10-CM | POA: Insufficient documentation

## 2015-04-26 ENCOUNTER — Ambulatory Visit (INDEPENDENT_AMBULATORY_CARE_PROVIDER_SITE_OTHER): Payer: Medicare Other | Admitting: Family Medicine

## 2015-04-26 ENCOUNTER — Encounter: Payer: Self-pay | Admitting: Family Medicine

## 2015-04-26 VITALS — BP 142/62 | HR 94 | Temp 98.1°F | Resp 18 | Wt 161.0 lb

## 2015-04-26 DIAGNOSIS — N181 Chronic kidney disease, stage 1: Secondary | ICD-10-CM | POA: Diagnosis not present

## 2015-04-26 DIAGNOSIS — E1122 Type 2 diabetes mellitus with diabetic chronic kidney disease: Secondary | ICD-10-CM | POA: Diagnosis not present

## 2015-04-26 DIAGNOSIS — E785 Hyperlipidemia, unspecified: Secondary | ICD-10-CM

## 2015-04-26 DIAGNOSIS — E039 Hypothyroidism, unspecified: Secondary | ICD-10-CM | POA: Diagnosis not present

## 2015-04-26 DIAGNOSIS — M129 Arthropathy, unspecified: Secondary | ICD-10-CM

## 2015-04-26 DIAGNOSIS — I10 Essential (primary) hypertension: Secondary | ICD-10-CM | POA: Diagnosis not present

## 2015-04-26 DIAGNOSIS — M1710 Unilateral primary osteoarthritis, unspecified knee: Secondary | ICD-10-CM

## 2015-04-26 DIAGNOSIS — H60542 Acute eczematoid otitis externa, left ear: Secondary | ICD-10-CM | POA: Diagnosis not present

## 2015-04-26 LAB — POCT GLYCOSYLATED HEMOGLOBIN (HGB A1C): HEMOGLOBIN A1C: 6.7

## 2015-04-26 MED ORDER — MOMETASONE FUROATE 0.1 % EX CREA: 1.0000 "application " | TOPICAL_CREAM | Freq: Every day | CUTANEOUS | Status: DC

## 2015-04-26 NOTE — Patient Instructions (Addendum)
Get debrox over the counter for left ear along with prescription called in.

## 2015-04-26 NOTE — Progress Notes (Signed)
Patient ID: Cynthia Castillo, female   DOB: 08/25/1940, 75 y.o.   MRN: [id number]    Subjective:  HPI  Diabetes Mellitus Type II, Follow-up:   Lab Results  Component Value Date   HGBA1C 6.7 10/23/2014   HGBA1C 6.9* 05/23/2014   Last seen for diabetes 3 months ago.  Management since then includes none. She reports good compliance with treatment. She is not having side effects.  Current symptoms include none and have been unchanged. Home blood sugar records: 136 this morning, nut usually lower than that.  Episodes of hypoglycemia? no   Current Insulin Regimen: n/a Most Recent Eye Exam: within the year Current exercise: PT for knee  ------------------------------------------------------------------------   Hypertension, follow-up:  BP Readings from Last 3 Encounters:  04/26/15 142/62  03/21/15 110/68  01/31/15 130/72    She was last seen for hypertension 3 months ago.  BP at that visit was 110/68. Management since that visit includes none .She reports good compliance with treatment. She is not having side effects.  She is exercising. She is adherent to low salt diet.   Outside blood pressures are 140's/80's. She is experiencing none.  Patient denies chest pain, chest pressure/discomfort, claudication, dyspnea, exertional chest pressure/discomfort, fatigue, irregular heart beat, lower extremity edema, near-syncope, orthopnea, palpitations and paroxysmal nocturnal dyspnea.   ------------------------------------------------------------------------    Prior to Admission medications   Medication Sig Start Date End Date Taking? Authorizing Provider  glucose blood (BAYER CONTOUR NEXT TEST) test strip Reported on 01/31/2015 11/14/13  Yes Historical Provider, MD  Multiple Vitamin (MULTIVITAMIN) capsule Take 1 capsule by mouth daily.   Yes Historical Provider, MD  Olmesartan-Amlodipine-HCTZ (TRIBENZOR) 40-10-25 MG TABS Take 1 tablet by mouth daily. 06/29/14  Yes Richard Maceo Pro., MD  Omega-3 Fatty Acids (FISH OIL) 1200 MG CAPS  10/01/07  Yes Historical Provider, MD    Patient Active Problem List   Diagnosis Date Noted  . Encounter for screening colonoscopy 01/31/2015  . Arthritis 01/01/2015  . Allergic rhinitis 08/15/2014  . Arthritis of knee 08/15/2014  . Endometriosis 08/15/2014  . HLD (hyperlipidemia) 08/15/2014  . Adult hypothyroidism 08/15/2014  . Adiposity 08/15/2014  . Primary osteoarthritis of knee 07/25/2014  . Mastalgia 02/01/2014  . Diabetes mellitus, type 2 ([place]) 12/27/2007  . Avitaminosis D 12/27/2007  . Deficiency, disaccharidase intestinal 09/18/2005  . Allergic reaction 12/09/1994  . Bloodgood disease 10/19/1990  . Essential (primary) hypertension 10/19/1990    Past Medical History  Diagnosis Date  . Hypertension   . Arthritis     hands  . Murmur   . Complication of anesthesia   . PONV (postoperative nausea and vomiting)   . Diabetes mellitus without complication ([place]) 123456    diet control    Social History   Social History  . Marital Status: Widowed    Spouse Name: N/A  . Number of Children: N/A  . Years of Education: N/A   Occupational History  . Not on file.   Social History Main Topics  . Smoking status: Never Smoker   . Smokeless tobacco: Never Used  . Alcohol Use: No  . Drug Use: No  . Sexual Activity: No   Other Topics Concern  . Not on file   Social History Narrative    Allergies  Allergen Reactions  . Latex Anaphylaxis    Review of Systems  Constitutional: Negative.   HENT: Negative.   Eyes: Negative.   Respiratory: Negative.   Cardiovascular: Negative.   Gastrointestinal: Negative.   Genitourinary:  Negative.   Musculoskeletal: Positive for joint pain.  Skin: Negative.   Neurological: Negative.   Psychiatric/Behavioral: Negative.     Immunization History  Administered Date(s) Administered  . Influenza, High Dose Seasonal PF 10/23/2014  . Pneumococcal Conjugate-13 08/16/2013  .  Pneumococcal Polysaccharide-23 11/20/2010  . Td 08/25/2003  . Tdap 10/20/2012  . Zoster 10/20/2012   Objective:  BP 142/62 mmHg  Pulse 94  Temp(Src) 98.1 F (36.7 C) (Oral)  Resp 18  Wt 161 lb (73.029 kg)  Physical Exam  Constitutional: She is oriented to person, place, and time and well-developed, well-nourished, and in no distress.  HENT:  Head: Normocephalic and atraumatic.  Right Ear: External ear normal.  Left Ear: External ear normal.  Nose: Nose normal.  Mouth/Throat: Oropharynx is clear and moist.  Eyes: Conjunctivae and EOM are normal. Pupils are equal, round, and reactive to light.  Neck: Normal range of motion. Neck supple.  Cardiovascular: Normal rate, regular rhythm, normal heart sounds and intact distal pulses.   Pulmonary/Chest: Effort normal and breath sounds normal.  Abdominal: Soft.  Musculoskeletal: Normal range of motion.  Neurological: She is alert and oriented to person, place, and time. She has normal reflexes. Gait normal. GCS score is 15.  Skin: Skin is warm and dry.  Psychiatric: Mood, memory, affect and judgment normal.    Lab Results  Component Value Date   WBC 12.4* 07/27/2014   HGB 11.3* 07/27/2014   HCT 34.6* 07/27/2014   PLT 359 07/27/2014   GLUCOSE 212* 07/26/2014   CHOL 183 05/26/2014   TRIG 103 05/26/2014   HDL 60 05/26/2014   LDLCALC 102 05/26/2014   TSH 2.56 05/26/2014   INR 0.96 07/12/2014   HGBA1C 6.7 10/23/2014    CMP     Component Value Date/Time   NA 139 07/26/2014 0505   NA 142 05/26/2014   K 3.5 07/26/2014 0505   CL 109 07/26/2014 0505   CO2 23 07/26/2014 0505   GLUCOSE 212* 07/26/2014 0505   BUN 11 07/26/2014 0505   CREATININE 0.91 07/26/2014 0505   CREATININE 0.9 05/26/2014   CALCIUM 8.9 07/26/2014 0505   AST 16 05/26/2014   ALT 14 05/26/2014   ALKPHOS 62 05/26/2014   GFRNONAA >60 07/26/2014 0505   GFRAA >60 07/26/2014 0505    Assessment and Plan :  1. Type 2 diabetes mellitus with stage 1 chronic  kidney disease, without long-term current use of insulin (HCC)  - POCT HgB A1C 6.7 today. Stable.  2. Essential (primary) hypertension   3. HLD (hyperlipidemia)   4. Hypothyroidism, unspecified hypothyroidism type   5. Eczema of external ear, left  - mometasone (ELOCON) 0.1 % cream; Apply 1 application topically daily. Apply small amount to the external ear. Use only for 2 weeks at a time.  Dispense: 45 g; Refill: 1  6. Arthritis of knee/Status post left total knee replacement July 5,2016. Pt to have knee replaced on 06/19/15. I have done the exam and reviewed the above chart and it is accurate to the best of my knowledge.   Patient was seen and examined by Dr. Miguel Aschoff, and noted scribed by Webb Laws, [place] MD [facility] 04/26/2015 1:52 PM

## 2015-04-30 ENCOUNTER — Ambulatory Visit: Payer: Medicare Other | Admitting: Family Medicine

## 2015-05-04 ENCOUNTER — Other Ambulatory Visit: Payer: Self-pay | Admitting: Family Medicine

## 2015-05-04 DIAGNOSIS — Z1231 Encounter for screening mammogram for malignant neoplasm of breast: Secondary | ICD-10-CM

## 2015-05-11 ENCOUNTER — Ambulatory Visit
Admission: RE | Admit: 2015-05-11 | Discharge: 2015-05-11 | Disposition: A | Discharge status: Home / Self Care | Payer: Medicare Other | Source: Ambulatory Visit | Attending: Family Medicine | Admitting: Family Medicine

## 2015-05-11 DIAGNOSIS — Z1231 Encounter for screening mammogram for malignant neoplasm of breast: Secondary | ICD-10-CM | POA: Insufficient documentation

## 2015-06-04 DIAGNOSIS — M1712 Unilateral primary osteoarthritis, left knee: Secondary | ICD-10-CM | POA: Diagnosis not present

## 2015-06-06 ENCOUNTER — Encounter
Admission: RE | Admit: 2015-06-06 | Discharge: 2015-06-06 | Disposition: A | Discharge status: Home / Self Care | Payer: Medicare Other | Source: Ambulatory Visit | Attending: Orthopedic Surgery | Admitting: Orthopedic Surgery

## 2015-06-06 DIAGNOSIS — Z01812 Encounter for preprocedural laboratory examination: Secondary | ICD-10-CM | POA: Insufficient documentation

## 2015-06-06 LAB — URINALYSIS COMPLETE WITH MICROSCOPIC (ARMC ONLY)
Bilirubin Urine: NEGATIVE
GLUCOSE, UA: NEGATIVE mg/dL
HGB URINE DIPSTICK: NEGATIVE
Ketones, ur: NEGATIVE mg/dL
LEUKOCYTES UA: NEGATIVE
NITRITE: NEGATIVE
Protein, ur: NEGATIVE mg/dL
SPECIFIC GRAVITY, URINE: 1.017 (ref 1.005–1.030)
pH: 5 (ref 5.0–8.0)

## 2015-06-06 LAB — CBC
HEMATOCRIT: 38.3 % (ref 35.0–47.0)
HEMOGLOBIN: 12.3 g/dL (ref 12.0–16.0)
MCH: 26 pg (ref 26.0–34.0)
MCHC: 32.2 g/dL (ref 32.0–36.0)
MCV: 80.7 fL (ref 80.0–100.0)
Platelets: 377 10*3/uL (ref 150–440)
RBC: 4.74 MIL/uL (ref 3.80–5.20)
RDW: 14.8 % — ABNORMAL HIGH (ref 11.5–14.5)
WBC: 8.5 10*3/uL (ref 3.6–11.0)

## 2015-06-06 LAB — PROTIME-INR
INR: 1.03
Prothrombin Time: 13.7 seconds (ref 11.4–15.0)

## 2015-06-06 LAB — BASIC METABOLIC PANEL
Anion gap: 9 (ref 5–15)
BUN: 15 mg/dL (ref 6–20)
CHLORIDE: 107 mmol/L (ref 101–111)
CO2: 24 mmol/L (ref 22–32)
CREATININE: 0.94 mg/dL (ref 0.44–1.00)
Calcium: 9.8 mg/dL (ref 8.9–10.3)
GFR calc Af Amer: >60 mL/min (ref >60)
GFR calc non Af Amer: 59 mL/min — ABNORMAL LOW (ref >60)
GLUCOSE: 133 mg/dL — AB (ref 65–99)
Potassium: 3.3 mmol/L — ABNORMAL LOW (ref 3.5–5.1)
Sodium: 140 mmol/L (ref 135–145)

## 2015-06-06 LAB — HEMOGLOBIN A1C: HEMOGLOBIN A1C: 6.6 % — AB (ref 4.0–6.0)

## 2015-06-06 LAB — TYPE AND SCREEN
ABO/RH(D): A POS
ANTIBODY SCREEN: NEGATIVE

## 2015-06-06 LAB — APTT: aPTT: 32 seconds (ref 24–36)

## 2015-06-06 LAB — SURGICAL PCR SCREEN
MRSA, PCR: NEGATIVE
Staphylococcus aureus: NEGATIVE

## 2015-06-06 LAB — SEDIMENTATION RATE: Sed Rate: 57 mm/hr — ABNORMAL HIGH (ref 0–30)

## 2015-06-06 NOTE — Patient Instructions (Signed)
Your procedure is scheduled on: Tuesday 06/19/15  Report to Day Surgery. 2ND FLOOR MEDICAL MALL ENTRANCE To find out your arrival time please call [phone] between 1PM - 3PM on Friday 06/15/15.  Remember: Instructions that are not followed completely may result in serious medical risk, up to and including death, or upon the discretion of your surgeon and anesthesiologist your surgery may need to be rescheduled.    __X__ 1. Do not eat food or drink liquids after midnight. No gum chewing or hard candies.     __X__ 2. No Alcohol for 24 hours before or after surgery.   ____ 3. Bring all medications with you on the day of surgery if instructed.    __X__ 4. Notify your doctor if there is any change in your medical condition     (cold, fever, infections).     Do not wear jewelry, make-up, hairpins, clips or nail polish.  Do not wear lotions, powders, or perfumes.   Do not shave 48 hours prior to surgery. Men may shave face and neck.  Do not bring valuables to the hospital.    [facility] is not responsible for any belongings or valuables.               Contacts, dentures or bridgework may not be worn into surgery.  Leave your suitcase in the car. After surgery it may be brought to your room.  For patients admitted to the hospital, discharge time is determined by your                treatment team.   Patients discharged the day of surgery will not be allowed to drive home.   Please read over the following fact sheets that you were given:   MRSA Information and Surgical Site Infection Prevention   __X__ Take these medicines the morning of surgery with A SIP OF WATER:    1. TRIBENZOR  2.   3.   4.  5.  6.  ____ Fleet Enema (as directed)   __X__ Use CHG Soap as directed  ____ Use inhalers on the day of surgery  ____ Stop metformin 2 days prior to surgery    ____ Take 1/2 of usual insulin dose the night before surgery and none on the morning of surgery.   __X__ Stop  Coumadin/Plavix/aspirin on 06/09/15  ____ Stop Anti-inflammatories on    ____ Stop supplements until after surgery.    ____ Bring C-Pap to the hospital.

## 2015-06-07 LAB — URINE CULTURE

## 2015-06-08 NOTE — Pre-Procedure Instructions (Addendum)
Urine culture and HgbA1c results sent to Dr. Rudene Christians for review.

## 2015-06-19 ENCOUNTER — Inpatient Hospital Stay
Admission: RE | Admit: 2015-06-19 | Discharge: 2015-06-21 | DRG: 470 | Disposition: A | Discharge status: Skilled Nursing Facility | Payer: Medicare Other | Source: Ambulatory Visit | Attending: Orthopedic Surgery | Admitting: Orthopedic Surgery

## 2015-06-19 ENCOUNTER — Inpatient Hospital Stay: Payer: Medicare Other | Admitting: Certified Registered Nurse Anesthetist

## 2015-06-19 ENCOUNTER — Encounter: Payer: Self-pay | Admitting: *Deleted

## 2015-06-19 ENCOUNTER — Encounter
Admission: RE | Disposition: A | Discharge status: Skilled Nursing Facility | Payer: Self-pay | Source: Ambulatory Visit | Attending: Orthopedic Surgery

## 2015-06-19 ENCOUNTER — Inpatient Hospital Stay: Payer: Medicare Other

## 2015-06-19 DIAGNOSIS — E559 Vitamin D deficiency, unspecified: Secondary | ICD-10-CM | POA: Diagnosis not present

## 2015-06-19 DIAGNOSIS — Z79899 Other long term (current) drug therapy: Secondary | ICD-10-CM

## 2015-06-19 DIAGNOSIS — R262 Difficulty in walking, not elsewhere classified: Secondary | ICD-10-CM | POA: Diagnosis not present

## 2015-06-19 DIAGNOSIS — E1143 Type 2 diabetes mellitus with diabetic autonomic (poly)neuropathy: Secondary | ICD-10-CM | POA: Diagnosis not present

## 2015-06-19 DIAGNOSIS — Z96652 Presence of left artificial knee joint: Secondary | ICD-10-CM | POA: Diagnosis not present

## 2015-06-19 DIAGNOSIS — Z91040 Latex allergy status: Secondary | ICD-10-CM | POA: Diagnosis not present

## 2015-06-19 DIAGNOSIS — I10 Essential (primary) hypertension: Secondary | ICD-10-CM | POA: Diagnosis present

## 2015-06-19 DIAGNOSIS — M1712 Unilateral primary osteoarthritis, left knee: Principal | ICD-10-CM | POA: Diagnosis present

## 2015-06-19 DIAGNOSIS — G8918 Other acute postprocedural pain: Secondary | ICD-10-CM

## 2015-06-19 DIAGNOSIS — M1990 Unspecified osteoarthritis, unspecified site: Secondary | ICD-10-CM | POA: Diagnosis not present

## 2015-06-19 DIAGNOSIS — E119 Type 2 diabetes mellitus without complications: Secondary | ICD-10-CM | POA: Diagnosis present

## 2015-06-19 DIAGNOSIS — M158 Other polyosteoarthritis: Secondary | ICD-10-CM | POA: Diagnosis not present

## 2015-06-19 DIAGNOSIS — Z7401 Bed confinement status: Secondary | ICD-10-CM | POA: Diagnosis not present

## 2015-06-19 DIAGNOSIS — M25562 Pain in left knee: Secondary | ICD-10-CM | POA: Diagnosis not present

## 2015-06-19 DIAGNOSIS — Z471 Aftercare following joint replacement surgery: Secondary | ICD-10-CM | POA: Diagnosis not present

## 2015-06-19 DIAGNOSIS — M6281 Muscle weakness (generalized): Secondary | ICD-10-CM | POA: Diagnosis not present

## 2015-06-19 DIAGNOSIS — M179 Osteoarthritis of knee, unspecified: Secondary | ICD-10-CM | POA: Diagnosis not present

## 2015-06-19 DIAGNOSIS — Z96651 Presence of right artificial knee joint: Secondary | ICD-10-CM | POA: Diagnosis not present

## 2015-06-19 DIAGNOSIS — J3089 Other allergic rhinitis: Secondary | ICD-10-CM | POA: Diagnosis not present

## 2015-06-19 DIAGNOSIS — E039 Hypothyroidism, unspecified: Secondary | ICD-10-CM | POA: Diagnosis not present

## 2015-06-19 DIAGNOSIS — E785 Hyperlipidemia, unspecified: Secondary | ICD-10-CM | POA: Diagnosis not present

## 2015-06-19 HISTORY — PX: TOTAL KNEE ARTHROPLASTY: SHX125

## 2015-06-19 LAB — CBC
HEMATOCRIT: 34.3 % — AB (ref 35.0–47.0)
Hemoglobin: 11.4 g/dL — ABNORMAL LOW (ref 12.0–16.0)
MCH: 26.6 pg (ref 26.0–34.0)
MCHC: 33.2 g/dL (ref 32.0–36.0)
MCV: 80.3 fL (ref 80.0–100.0)
PLATELETS: 373 10*3/uL (ref 150–440)
RBC: 4.28 MIL/uL (ref 3.80–5.20)
RDW: 14.6 % — ABNORMAL HIGH (ref 11.5–14.5)
WBC: 7.6 10*3/uL (ref 3.6–11.0)

## 2015-06-19 LAB — CREATININE, SERUM
Creatinine, Ser: 1.07 mg/dL — ABNORMAL HIGH (ref 0.44–1.00)
GFR calc Af Amer: 58 mL/min — ABNORMAL LOW (ref >60)
GFR calc non Af Amer: 50 mL/min — ABNORMAL LOW (ref >60)

## 2015-06-19 LAB — GLUCOSE, CAPILLARY
GLUCOSE-CAPILLARY: 178 mg/dL — AB (ref 65–99)
GLUCOSE-CAPILLARY: 172 mg/dL — AB (ref 65–99)
GLUCOSE-CAPILLARY: 175 mg/dL — AB (ref 65–99)
Glucose-Capillary: 156 mg/dL — ABNORMAL HIGH (ref 65–99)
Glucose-Capillary: 143 mg/dL — ABNORMAL HIGH (ref 65–99)

## 2015-06-19 SURGERY — ARTHROPLASTY, KNEE, TOTAL
Anesthesia: Spinal | Site: Knee | Laterality: Left | Wound class: Clean

## 2015-06-19 MED ORDER — EPHEDRINE SULFATE 50 MG/ML IJ SOLN
INTRAMUSCULAR | Status: DC | PRN
  Administered 2015-06-19: 5 mg via INTRAVENOUS

## 2015-06-19 MED ORDER — INSULIN ASPART 100 UNIT/ML ~~LOC~~ SOLN
0.0000 [IU] | Freq: Three times a day (TID) | SUBCUTANEOUS | Status: DC
  Administered 2015-06-19: 3 [IU] via SUBCUTANEOUS
  Administered 2015-06-19 – 2015-06-21 (×3): 2 [IU] via SUBCUTANEOUS
  Administered 2015-06-21: 3 [IU] via SUBCUTANEOUS
  Filled 2015-06-19: qty 2
  Filled 2015-06-19: qty 3
  Filled 2015-06-19: qty 1
  Filled 2015-06-19: qty 3
  Filled 2015-06-19: qty 2

## 2015-06-19 MED ORDER — MAGNESIUM HYDROXIDE 400 MG/5ML PO SUSP
30.0000 mL | Freq: Every day | ORAL | Status: DC | PRN
  Filled 2015-06-19: qty 30

## 2015-06-19 MED ORDER — MOMETASONE FUROATE 0.1 % EX CREA
1.0000 "application " | TOPICAL_CREAM | Freq: Every day | CUTANEOUS | Status: DC | PRN
  Filled 2015-06-19: qty 15

## 2015-06-19 MED ORDER — DEXAMETHASONE SODIUM PHOSPHATE 4 MG/ML IJ SOLN
INTRAMUSCULAR | Status: DC | PRN
  Administered 2015-06-19: 4 mg via INTRAVENOUS

## 2015-06-19 MED ORDER — ACETAMINOPHEN 10 MG/ML IV SOLN
INTRAVENOUS | Status: AC
  Filled 2015-06-19: qty 100

## 2015-06-19 MED ORDER — DIPHENHYDRAMINE HCL 12.5 MG/5ML PO ELIX
12.5000 mg | ORAL_SOLUTION | ORAL | Status: DC | PRN
  Filled 2015-06-19: qty 10

## 2015-06-19 MED ORDER — SODIUM CHLORIDE 0.9 % IV SOLN
10000.0000 ug | INTRAVENOUS | Status: DC | PRN
  Administered 2015-06-19: 50 ug/min via INTRAVENOUS

## 2015-06-19 MED ORDER — SODIUM CHLORIDE 0.9 % IV SOLN
INTRAVENOUS | Status: DC
  Administered 2015-06-19: 07:00:00 via INTRAVENOUS

## 2015-06-19 MED ORDER — FAMOTIDINE 20 MG PO TABS
ORAL_TABLET | ORAL | Status: AC
  Administered 2015-06-19: 20 mg
  Filled 2015-06-19: qty 1

## 2015-06-19 MED ORDER — METOCLOPRAMIDE HCL 10 MG PO TABS
5.0000 mg | ORAL_TABLET | Freq: Three times a day (TID) | ORAL | Status: DC | PRN
  Administered 2015-06-19: 10 mg via ORAL
  Filled 2015-06-19: qty 2

## 2015-06-19 MED ORDER — NEOMYCIN-POLYMYXIN B GU 40-200000 IR SOLN
Status: AC
  Filled 2015-06-19: qty 20

## 2015-06-19 MED ORDER — NEOMYCIN-POLYMYXIN B GU 40-200000 IR SOLN
Status: DC | PRN
  Administered 2015-06-19: 16 mL

## 2015-06-19 MED ORDER — AMLODIPINE BESYLATE 10 MG PO TABS
10.0000 mg | ORAL_TABLET | Freq: Every day | ORAL | Status: DC
  Administered 2015-06-20 – 2015-06-21 (×2): 10 mg via ORAL
  Filled 2015-06-19 (×2): qty 1

## 2015-06-19 MED ORDER — ADULT MULTIVITAMIN W/MINERALS CH
1.0000 | ORAL_TABLET | Freq: Every day | ORAL | Status: DC
Start: 2015-06-19 — End: 2015-06-21
  Administered 2015-06-20 – 2015-06-21 (×2): 1 via ORAL
  Filled 2015-06-19 (×2): qty 1

## 2015-06-19 MED ORDER — MORPHINE SULFATE (PF) 2 MG/ML IV SOLN: 2.0000 mg | INTRAVENOUS | Status: DC | PRN

## 2015-06-19 MED ORDER — METHOCARBAMOL 1000 MG/10ML IJ SOLN
500.0000 mg | Freq: Four times a day (QID) | INTRAVENOUS | Status: DC | PRN
  Filled 2015-06-19: qty 5

## 2015-06-19 MED ORDER — PROPOFOL 500 MG/50ML IV EMUL
INTRAVENOUS | Status: DC | PRN
  Administered 2015-06-19: 75 ug/kg/min via INTRAVENOUS

## 2015-06-19 MED ORDER — BUPIVACAINE HCL (PF) 0.5 % IJ SOLN
INTRAMUSCULAR | Status: DC | PRN
  Administered 2015-06-19: 2.8 mL

## 2015-06-19 MED ORDER — LIDOCAINE HCL (CARDIAC) 20 MG/ML IV SOLN
INTRAVENOUS | Status: DC | PRN
  Administered 2015-06-19: 40 mg via INTRAVENOUS

## 2015-06-19 MED ORDER — OXYCODONE HCL 5 MG PO TABS
5.0000 mg | ORAL_TABLET | ORAL | Status: DC | PRN
  Administered 2015-06-19 – 2015-06-21 (×5): 5 mg via ORAL
  Filled 2015-06-19: qty 2
  Filled 2015-06-19 (×6): qty 1

## 2015-06-19 MED ORDER — ACETAMINOPHEN 650 MG RE SUPP: 650.0000 mg | Freq: Four times a day (QID) | RECTAL | Status: DC | PRN

## 2015-06-19 MED ORDER — ENOXAPARIN SODIUM 30 MG/0.3ML ~~LOC~~ SOLN
30.0000 mg | Freq: Two times a day (BID) | SUBCUTANEOUS | Status: DC
Start: 2015-06-20 — End: 2015-06-21
  Administered 2015-06-20 – 2015-06-21 (×3): 30 mg via SUBCUTANEOUS
  Filled 2015-06-19 (×3): qty 0.3

## 2015-06-19 MED ORDER — CEFAZOLIN SODIUM-DEXTROSE 2-4 GM/100ML-% IV SOLN
2.0000 g | Freq: Once | INTRAVENOUS | Status: AC
  Administered 2015-06-19: 2 g via INTRAVENOUS

## 2015-06-19 MED ORDER — MENTHOL 3 MG MT LOZG
1.0000 | LOZENGE | OROMUCOSAL | Status: DC | PRN
  Filled 2015-06-19: qty 9

## 2015-06-19 MED ORDER — IRBESARTAN 75 MG PO TABS
300.0000 mg | ORAL_TABLET | Freq: Every day | ORAL | Status: DC
  Administered 2015-06-20 – 2015-06-21 (×2): 300 mg via ORAL
  Filled 2015-06-19 (×2): qty 4

## 2015-06-19 MED ORDER — OLMESARTAN-AMLODIPINE-HCTZ 40-10-25 MG PO TABS: 1.0000 | ORAL_TABLET | Freq: Every day | ORAL | Status: DC

## 2015-06-19 MED ORDER — MORPHINE SULFATE (PF) 10 MG/ML IV SOLN
INTRAVENOUS | Status: AC
  Filled 2015-06-19: qty 1

## 2015-06-19 MED ORDER — CEFAZOLIN SODIUM-DEXTROSE 2-4 GM/100ML-% IV SOLN
INTRAVENOUS | Status: AC
  Filled 2015-06-19: qty 100

## 2015-06-19 MED ORDER — BUPIVACAINE-EPINEPHRINE (PF) 0.25% -1:200000 IJ SOLN
INTRAMUSCULAR | Status: DC | PRN
  Administered 2015-06-19: 30 mL via PERINEURAL

## 2015-06-19 MED ORDER — BUPIVACAINE-EPINEPHRINE (PF) 0.25% -1:200000 IJ SOLN
INTRAMUSCULAR | Status: AC
  Filled 2015-06-19: qty 30

## 2015-06-19 MED ORDER — METHOCARBAMOL 500 MG PO TABS
500.0000 mg | ORAL_TABLET | Freq: Four times a day (QID) | ORAL | Status: DC | PRN
  Administered 2015-06-20: 500 mg via ORAL
  Filled 2015-06-19: qty 1

## 2015-06-19 MED ORDER — PHENYLEPHRINE HCL 10 MG/ML IJ SOLN
INTRAMUSCULAR | Status: DC | PRN
  Administered 2015-06-19 (×2): 100 ug via INTRAVENOUS
  Administered 2015-06-19: 200 ug via INTRAVENOUS

## 2015-06-19 MED ORDER — TRANEXAMIC ACID 1000 MG/10ML IV SOLN
1000.0000 mg | INTRAVENOUS | Status: AC
  Administered 2015-06-19: 1000 mg via INTRAVENOUS
  Filled 2015-06-19: qty 10

## 2015-06-19 MED ORDER — PROPOFOL 10 MG/ML IV BOLUS
INTRAVENOUS | Status: DC | PRN
  Administered 2015-06-19: 20 mg via INTRAVENOUS
  Administered 2015-06-19: 30 mg via INTRAVENOUS

## 2015-06-19 MED ORDER — ONDANSETRON HCL 4 MG/2ML IJ SOLN
4.0000 mg | Freq: Four times a day (QID) | INTRAMUSCULAR | Status: DC | PRN
  Administered 2015-06-19 (×2): 4 mg via INTRAVENOUS
  Filled 2015-06-19 (×2): qty 2

## 2015-06-19 MED ORDER — MIDAZOLAM HCL 5 MG/5ML IJ SOLN
INTRAMUSCULAR | Status: DC | PRN
  Administered 2015-06-19 (×2): 1 mg via INTRAVENOUS

## 2015-06-19 MED ORDER — HYDROCHLOROTHIAZIDE 25 MG PO TABS
25.0000 mg | ORAL_TABLET | Freq: Every day | ORAL | Status: DC
  Administered 2015-06-20 – 2015-06-21 (×2): 25 mg via ORAL
  Filled 2015-06-19 (×2): qty 1

## 2015-06-19 MED ORDER — ONDANSETRON HCL 4 MG PO TABS: 4.0000 mg | ORAL_TABLET | Freq: Four times a day (QID) | ORAL | Status: DC | PRN

## 2015-06-19 MED ORDER — PHENOL 1.4 % MT LIQD
1.0000 | OROMUCOSAL | Status: DC | PRN
  Filled 2015-06-19: qty 177

## 2015-06-19 MED ORDER — SODIUM CHLORIDE 0.9 % IV SOLN
INTRAVENOUS | Status: DC
  Administered 2015-06-19: 1000 mL via INTRAVENOUS
  Administered 2015-06-20: 04:00:00 via INTRAVENOUS

## 2015-06-19 MED ORDER — ONDANSETRON HCL 4 MG/2ML IJ SOLN
INTRAMUSCULAR | Status: DC | PRN
  Administered 2015-06-19: 4 mg via INTRAVENOUS

## 2015-06-19 MED ORDER — DOCUSATE SODIUM 100 MG PO CAPS
100.0000 mg | ORAL_CAPSULE | Freq: Two times a day (BID) | ORAL | Status: DC
  Administered 2015-06-19 – 2015-06-21 (×4): 100 mg via ORAL
  Filled 2015-06-19 (×4): qty 1

## 2015-06-19 MED ORDER — FENTANYL CITRATE (PF) 100 MCG/2ML IJ SOLN
INTRAMUSCULAR | Status: DC | PRN
Start: 2015-06-19 — End: 2015-06-19
  Administered 2015-06-19 (×4): 25 ug via INTRAVENOUS

## 2015-06-19 MED ORDER — ONDANSETRON HCL 4 MG/2ML IJ SOLN: 4.0000 mg | Freq: Once | INTRAMUSCULAR | Status: DC | PRN

## 2015-06-19 MED ORDER — MAGNESIUM CITRATE PO SOLN
1.0000 | Freq: Once | ORAL | Status: AC | PRN
  Administered 2015-06-21: 1 via ORAL
  Filled 2015-06-19 (×2): qty 296

## 2015-06-19 MED ORDER — SODIUM CHLORIDE 0.9 % IJ SOLN
INTRAMUSCULAR | Status: DC | PRN
  Administered 2015-06-19: 30 mL

## 2015-06-19 MED ORDER — ACETAMINOPHEN 10 MG/ML IV SOLN
INTRAVENOUS | Status: DC | PRN
  Administered 2015-06-19: 1000 mg via INTRAVENOUS

## 2015-06-19 MED ORDER — BUPIVACAINE LIPOSOME 1.3 % IJ SUSP
INTRAMUSCULAR | Status: AC
  Filled 2015-06-19: qty 20

## 2015-06-19 MED ORDER — CEFAZOLIN SODIUM 1-5 GM-% IV SOLN
1.0000 g | Freq: Four times a day (QID) | INTRAVENOUS | Status: AC
  Administered 2015-06-19 – 2015-06-20 (×3): 1 g via INTRAVENOUS
  Filled 2015-06-19 (×3): qty 50

## 2015-06-19 MED ORDER — ALUM & MAG HYDROXIDE-SIMETH 200-200-20 MG/5ML PO SUSP: 30.0000 mL | ORAL | Status: DC | PRN

## 2015-06-19 MED ORDER — FENTANYL CITRATE (PF) 100 MCG/2ML IJ SOLN: 25.0000 ug | INTRAMUSCULAR | Status: DC | PRN

## 2015-06-19 MED ORDER — MORPHINE SULFATE 10 MG/ML IJ SOLN
INTRAMUSCULAR | Status: DC | PRN
  Administered 2015-06-19: 10 mg

## 2015-06-19 MED ORDER — BUPIVACAINE LIPOSOME 1.3 % IJ SUSP
INTRAMUSCULAR | Status: DC | PRN
  Administered 2015-06-19: 20 mL

## 2015-06-19 MED ORDER — SODIUM CHLORIDE 0.9 % IJ SOLN
INTRAMUSCULAR | Status: AC
  Filled 2015-06-19: qty 100

## 2015-06-19 MED ORDER — METOCLOPRAMIDE HCL 5 MG/ML IJ SOLN: 5.0000 mg | Freq: Three times a day (TID) | INTRAMUSCULAR | Status: DC | PRN

## 2015-06-19 MED ORDER — ZOLPIDEM TARTRATE 5 MG PO TABS
5.0000 mg | ORAL_TABLET | Freq: Every evening | ORAL | Status: DC | PRN
  Filled 2015-06-19: qty 1

## 2015-06-19 MED ORDER — ACETAMINOPHEN 325 MG PO TABS: 650.0000 mg | ORAL_TABLET | Freq: Four times a day (QID) | ORAL | Status: DC | PRN

## 2015-06-19 MED ORDER — KETOROLAC TROMETHAMINE 30 MG/ML IJ SOLN
INTRAMUSCULAR | Status: DC | PRN
  Administered 2015-06-19: 30 mg

## 2015-06-19 MED ORDER — FAMOTIDINE 20 MG PO TABS: 20.0000 mg | ORAL_TABLET | Freq: Once | ORAL | Status: DC

## 2015-06-19 MED ORDER — BISACODYL 5 MG PO TBEC
5.0000 mg | DELAYED_RELEASE_TABLET | Freq: Every day | ORAL | Status: DC | PRN
Start: 2015-06-19 — End: 2015-06-21
  Administered 2015-06-20 – 2015-06-21 (×2): 5 mg via ORAL
  Filled 2015-06-19 (×2): qty 1

## 2015-06-19 SURGICAL SUPPLY — 58 items
BANDAGE ACE 6X5 VEL STRL LF (GAUZE/BANDAGES/DRESSINGS) ×2 IMPLANT
BLADE SAW 1 (BLADE) ×2 IMPLANT
BLADE SAW 1/2 (BLADE) ×2 IMPLANT
BLOCK CUTTING FEMUR 2 LT MED (MISCELLANEOUS) IMPLANT
BONE CEMENT GENTAMICIN (Cement) ×4 IMPLANT
CANISTER SUCT 1200ML W/VALVE (MISCELLANEOUS) ×2 IMPLANT
CANISTER SUCT 3000ML (MISCELLANEOUS) ×4 IMPLANT
CAPT KNEE TOTAL 3 ×2 IMPLANT
CATH FOL LEG HOLDER (MISCELLANEOUS) ×2 IMPLANT
CATH TRAY METER 16FR LF (MISCELLANEOUS) ×2 IMPLANT
CEMENT BONE GENTAMICIN 40 (Cement) ×2 IMPLANT
CHLORAPREP W/TINT 26ML (MISCELLANEOUS) ×2 IMPLANT
COOLER POLAR GLACIER W/PUMP (MISCELLANEOUS) ×2 IMPLANT
CUFF TOURN 24 STER (MISCELLANEOUS) IMPLANT
CUFF TOURN 30 STER DUAL PORT (MISCELLANEOUS) ×2 IMPLANT
DRAPE INCISE IOBAN 66X45 STRL (DRAPES) ×4 IMPLANT
DRAPE SHEET LG 3/4 BI-LAMINATE (DRAPES) ×4 IMPLANT
ELECT CAUTERY BLADE 6.4 (BLADE) ×2 IMPLANT
ELECT REM PT RETURN 9FT ADLT (ELECTROSURGICAL) ×2
ELECTRODE REM PT RTRN 9FT ADLT (ELECTROSURGICAL) ×1 IMPLANT
GAUZE PETRO XEROFOAM 1X8 (MISCELLANEOUS) ×2 IMPLANT
GAUZE SPONGE 4X4 12PLY STRL (GAUZE/BANDAGES/DRESSINGS) ×2 IMPLANT
GLOVE BIOGEL PI IND STRL 9 (GLOVE) ×1 IMPLANT
GLOVE BIOGEL PI INDICATOR 9 (GLOVE) ×1
GLOVE INDICATOR 8.0 STRL GRN (GLOVE) ×2 IMPLANT
GLOVE SURG ORTHO 8.0 STRL STRW (GLOVE) ×2 IMPLANT
GLOVE SURG ORTHO 9.0 STRL STRW (GLOVE) ×2 IMPLANT
GOWN STRL REUS W/ TWL LRG LVL3 (GOWN DISPOSABLE) ×1 IMPLANT
GOWN STRL REUS W/ TWL XL LVL3 (GOWN DISPOSABLE) ×2 IMPLANT
GOWN STRL REUS W/TWL LRG LVL3 (GOWN DISPOSABLE) ×1
GOWN STRL REUS W/TWL XL LVL3 (GOWN DISPOSABLE) ×2
GOWN SURG XXL (GOWNS) ×2 IMPLANT
HANDPIECE SUCTION TUBG SURGILV (MISCELLANEOUS) ×2 IMPLANT
HOOD PEEL AWAY FLYTE STAYCOOL (MISCELLANEOUS) ×4 IMPLANT
IMMBOLIZER KNEE 19 BLUE UNIV (SOFTGOODS) ×2 IMPLANT
KIT RM TURNOVER STRD PROC AR (KITS) ×2 IMPLANT
KNEE MEDACTA TIBIAL/FEMORAL BL (Knees) ×2 IMPLANT
KNIFE SCULPS 14X20 (INSTRUMENTS) ×2 IMPLANT
NDL SAFETY 18GX1.5 (NEEDLE) ×2 IMPLANT
NEEDLE SPNL 18GX3.5 QUINCKE PK (NEEDLE) ×2 IMPLANT
NEEDLE SPNL 20GX3.5 QUINCKE YW (NEEDLE) ×2 IMPLANT
NS IRRIG 1000ML POUR BTL (IV SOLUTION) ×2 IMPLANT
PACK TOTAL KNEE (MISCELLANEOUS) ×2 IMPLANT
PAD WRAPON POLAR KNEE (MISCELLANEOUS) ×1 IMPLANT
SOL .9 NS 3000ML IRR  AL (IV SOLUTION) ×1
SOL .9 NS 3000ML IRR UROMATIC (IV SOLUTION) ×1 IMPLANT
STAPLER SKIN PROX 35W (STAPLE) ×2 IMPLANT
SUCTION FRAZIER HANDLE 10FR (MISCELLANEOUS) ×1
SUCTION TUBE FRAZIER 10FR DISP (MISCELLANEOUS) ×1 IMPLANT
SUT DVC 2 QUILL PDO  T11 36X36 (SUTURE) ×1
SUT DVC 2 QUILL PDO T11 36X36 (SUTURE) ×1 IMPLANT
SUT DVC QUILL MONODERM 30X30 (SUTURE) ×2 IMPLANT
SYR 20CC LL (SYRINGE) ×2 IMPLANT
SYR 50ML LL SCALE MARK (SYRINGE) ×4 IMPLANT
TIBIAL BONE MODEL LEFT (MISCELLANEOUS) IMPLANT
TOWEL OR 17X26 4PK STRL BLUE (TOWEL DISPOSABLE) ×2 IMPLANT
TOWER CARTRIDGE SMART MIX (DISPOSABLE) ×2 IMPLANT
WRAPON POLAR PAD KNEE (MISCELLANEOUS) ×2

## 2015-06-19 NOTE — H&P (Signed)
Reviewed paper H+P, will be scanned into chart. No changes noted.  

## 2015-06-19 NOTE — Transfer of Care (Signed)
Immediate Anesthesia Transfer of Care Note  Patient: Cynthia Castillo  Procedure(s) Performed: Procedure(s): TOTAL KNEE ARTHROPLASTY (Left)  Patient Location: PACU  Anesthesia Type:Spinal  Level of Consciousness: awake and alert   Airway & Oxygen Therapy: Patient Spontanous Breathing and Patient connected to face mask oxygen  Post-op Assessment: Report given to RN and Post -op Vital signs reviewed and stable  Post vital signs: Reviewed and stable  Last Vitals:  Filed Vitals:   06/19/15 0612  BP: 175/73  Pulse: 125  Temp: 36.9 C  Resp: 16    Last Pain: There were no vitals filed for this visit.       Complications: No apparent anesthesia complications

## 2015-06-19 NOTE — Anesthesia Preprocedure Evaluation (Signed)
Anesthesia Evaluation  Patient identified by MRN, date of birth, ID band Patient awake    History of Anesthesia Complications (+) PONV  Airway Mallampati: II       Dental  (+) Teeth Intact   Pulmonary neg pulmonary ROS,    Pulmonary exam normal        Cardiovascular Exercise Tolerance: Good hypertension, Pt. on medications  Rhythm:Regular Rate:Normal     Neuro/Psych negative neurological ROS     GI/Hepatic negative GI ROS, Neg liver ROS,   Endo/Other  diabetes, Well Controlled, Type 2, Oral Hypoglycemic AgentsHypothyroidism   Renal/GU negative Renal ROS     Musculoskeletal   Abdominal (+) + obese,   Peds  Hematology   Anesthesia Other Findings   Reproductive/Obstetrics                             Anesthesia Physical Anesthesia Plan  ASA: III  Anesthesia Plan: Spinal   Post-op Pain Management:    Induction:   Airway Management Planned: Natural Airway and Nasal Cannula  Additional Equipment:   Intra-op Plan:   Post-operative Plan:   Informed Consent: I have reviewed the patients History and Physical, chart, labs and discussed the procedure including the risks, benefits and alternatives for the proposed anesthesia with the patient or authorized representative who has indicated his/her understanding and acceptance.     Plan Discussed with: CRNA  Anesthesia Plan Comments:         Anesthesia Quick Evaluation

## 2015-06-19 NOTE — Anesthesia Procedure Notes (Addendum)
Spinal Patient location during procedure: OR Start time: 06/19/2015 7:20 AM End time: 06/19/2015 7:27 AM Reason for block: at surgeon's request Staffing Anesthesiologist: Marline Backbone F Performed by: anesthesiologist  Preanesthetic Checklist Completed: patient identified, site marked, surgical consent, pre-op evaluation, timeout performed, IV checked, risks and benefits discussed, monitors and equipment checked and at surgeon's request Spinal Block Patient position: sitting Prep: Betadine Patient monitoring: heart rate and blood pressure Approach: right paramedian Location: L3-4 Injection technique: single-shot Needle Needle type: Quincke  Needle gauge: 22 G Needle length: 9 cm Needle insertion depth: 6 cm Assessment Sensory level: T8  Date/Time: 06/19/2015 7:30 AM Performed by: Johnna Acosta Pre-anesthesia Checklist: Patient identified, Emergency Drugs available, Suction available, Patient being monitored and Timeout performed Patient Re-evaluated:Patient Re-evaluated prior to inductionOxygen Delivery Method: Simple face mask

## 2015-06-19 NOTE — Evaluation (Signed)
Physical Therapy Evaluation Patient Details Name: Cynthia Castillo MRN: [id number] DOB: 1940/11/03 Today's Date: 06/19/2015   History of Present Illness  Pt admitted for L TKR. Pt with history of R TKR 1 year ago. Pt discharged to rehab at that time  Clinical Impression  Pt is a pleasant 75 year old female who was admitted for L TKR. Pt performs bed mobility, transfers, and ambulation with min assist and rw. Pt able to perform 10 SLRs with no assist, therefore does not require KI at this time. Pt motivated to perform therapy, however becomes nauseous with all movement. Pt demonstrates deficits with pain/strength/mobility. Would benefit from skilled PT to address above deficits and promote optimal return to PLOF. recommend transition to STR upon discharge from acute hospitalization.       Follow Up Recommendations SNF    Equipment Recommendations       Recommendations for Other Services       Precautions / Restrictions Precautions Precautions: Fall Restrictions Weight Bearing Restrictions: Yes RLE Weight Bearing:  (L LE WBAT)      Mobility  Bed Mobility Overal bed mobility: Needs Assistance Bed Mobility: Supine to Sit     Supine to sit: Min assist     General bed mobility comments: assist for sliding B LEs off bed. Assist given for trunk support. Once seated at EOB, pt able to sit with supervision  Transfers Overall transfer level: Needs assistance Equipment used: Rolling walker (2 wheeled) Transfers: Sit to/from Stand Sit to Stand: Min assist         General transfer comment: assist for transfer. Assist for anter translation once standing, pt struggles from lower surface. Once standing pt able to stand with cga.  Ambulation/Gait Ambulation/Gait assistance: Min assist Ambulation Distance (Feet): 3 Feet Assistive device: Rolling walker (2 wheeled) Gait Pattern/deviations: Step-to pattern     General Gait Details: ambulated to recliner with safe technique. Cues  for sequencing given. Pt needs cues and manual assist for mauevering rw.  Stairs            Wheelchair Mobility    Modified Rankin (Stroke Patients Only)       Balance Overall balance assessment: Needs assistance Sitting-balance support: Feet supported Sitting balance-Leahy Scale: Good     Standing balance support: Bilateral upper extremity supported Standing balance-Leahy Scale: Good                               Pertinent Vitals/Pain Pain Assessment: 0-10 Pain Score: 7  Pain Location: L knee Pain Descriptors / Indicators: Operative site guarding Pain Intervention(s): Limited activity within patient's tolerance;Ice applied    Home Living Family/patient expects to be discharged to:: Private residence Living Arrangements: Alone   Type of Home: House Home Access: Stairs to enter Entrance Stairs-Rails: None Entrance Stairs-Number of Steps: 2 (3 steps to enter living room once inside) Home Layout: Multi-level Home Equipment: Walker - standard;Cane - single point;Toilet riser      Prior Function Level of Independence: Independent with assistive device(s)         Comments: Single point cane, Independent for ADLs/IADLs     Hand Dominance        Extremity/Trunk Assessment   Upper Extremity Assessment: Overall WFL for tasks assessed           Lower Extremity Assessment: Generalized weakness (L LE grossly 3/5, able to perform SLRs without assistance)         Communication  Communication: No difficulties  Cognition Arousal/Alertness: Awake/alert Behavior During Therapy: WFL for tasks assessed/performed Overall Cognitive Status: Within Functional Limits for tasks assessed                      General Comments      Exercises Total Joint Exercises Goniometric ROM: L LE AAROM: 14-67 degrees Other Exercises Other Exercises: Pt performed supine ther-ex on L LE including ankle pumps, quad sets, SLRs, and hip abd/add. All  ther-ex performed x 10 reps with min assist and cues for correct technique      Assessment/Plan    PT Assessment Patient needs continued PT services  PT Diagnosis Difficulty walking;Abnormality of gait;Acute pain;Generalized weakness   PT Problem List Decreased strength;Decreased mobility;Pain;Decreased knowledge of use of DME  PT Treatment Interventions DME instruction;Gait training;Therapeutic exercise   PT Goals (Current goals can be found in the Care Plan section) Acute Rehab PT Goals Patient Stated Goal: to get stronger PT Goal Formulation: With patient Time For Goal Achievement: 07/03/15 Potential to Achieve Goals: Good Additional Goals Additional Goal #1: Pt will be able to perform bed mobility/transfers with rw and cga in order to improve functional independence    Frequency BID   Barriers to discharge        Co-evaluation               End of Session Equipment Utilized During Treatment: Gait belt Activity Tolerance: Patient tolerated treatment well Patient left: in chair;with chair alarm set Nurse Communication: Mobility status         Time: [id number] PT Time Calculation (min) (ACUTE ONLY): 26 min   Charges:   PT Evaluation $PT Eval Moderate Complexity: 1 Procedure PT Treatments $Therapeutic Exercise: 8-22 mins   PT G Codes:        Gini Caputo 07-01-15, 4:56 PM  Greggory Stallion, PT, DPT [phone]

## 2015-06-19 NOTE — Op Note (Signed)
06/19/2015  9:28 AM  PATIENT:  Cynthia Castillo  75 y.o. female  PRE-OPERATIVE DIAGNOSIS:  OSTEOARTHRITIS  POST-OPERATIVE DIAGNOSIS:  OSTEOARTHRITIS  PROCEDURE:  Procedure(s): TOTAL KNEE ARTHROPLASTY (Left)  SURGEON: Laurene Footman, MD  ASSISTANTS: Arvella Nigh [facility]  ANESTHESIA:   spinal  EBL:  Total I/O In: 800 [I.V.:800] Out: 150 [Urine:100; Blood:50]  BLOOD ADMINISTERED:none  DRAINS: none   LOCAL MEDICATIONS USED:  MARCAINE    and OTHER morphine and Exparel  SPECIMEN:  Source of Specimen:  Cut ends of bone  DISPOSITION OF SPECIMEN:  PATHOLOGY  COUNTS:  YES  TOURNIQUET:   62 minutes at 300 mmHg  IMPLANTS: Medacta GMK sphere 2 femur left, 2 tibia with 11 stem, 12 mm flex insert, 2 patella all components cemented  DICTATION: .Dragon Dictation patient brought the operating room and after adequate anesthesia was obtained, left leg was prepped and draped in the sterile fashion. Appropriate patient identification timeout procedures were completed and the tourniquet was raised to 300 mmHg. A midline skin incision was made with the knee in flexion followed by medial parapatellar arthrotomy. There is extensive synovitis in the knee with pitting of cartilage particularly the lateral femoral condyle anterior cruciate ligament and PCL were excised along the fat pad. Medial structures were elevated and the medial my knee cutting block applied. Approximately tibia cut was carried out and the tibia bone cut matched template. Femur was approached a similar fashion with the distal femoral cut carried out followed by application of the 2 4-in-1 cutting guide. Anterior posterior chamfer cuts carried out. Size 2 tibia was placed and with the appropriate rotation and pinned in place audible drilling carried out and keel punch placed next the 2 femur was placed and with a 12 mm insert gave excellent range of motion and stability. The distal femoral drill holes were made and the femoral trochlear groove cut  was made with a router. These trial components were removed and the patella was cut using the patellar cutting guide and sized to size 2 after 3 drill holes were made. The knee was then infiltrated with Exparel and a combination of morphine and Sensorcaine with epinephrine. Bony surfaces were then thoroughly irrigated and dried. Components were cemented into place first tibial component first followed by the tibial insert with set screw inserted with torque limiting screwdriver. The femoral component was placed in the is held in extension while the patellar button was clamped into place. After the cement set the knee was irrigated  by pulse lavage and closed with a heavy Quill for the capsule. 2-0 Quill subcutaneously staples Xeroform 4 x 4's ABDs and web roll and Polar Care patient sent to recovery in stable condition.  PLAN OF CARE: Admit to inpatient   PATIENT DISPOSITION: PACU - hemodynamically stable.

## 2015-06-19 NOTE — Anesthesia Postprocedure Evaluation (Signed)
Anesthesia Post Note  Patient: Cynthia Castillo  Procedure(s) Performed: Procedure(s) (LRB): TOTAL KNEE ARTHROPLASTY (Left)  Patient location during evaluation: PACU Anesthesia Type: Spinal Pain management: pain level controlled Vital Signs Assessment: post-procedure vital signs reviewed and stable Respiratory status: spontaneous breathing Cardiovascular status: stable Anesthetic complications: no    Last Vitals:  Filed Vitals:   06/19/15 0612 06/19/15 0923  BP: 175/73 109/51  Pulse: 125 91  Temp: 36.9 C 36.6 C  Resp: 16 14    Last Pain:  Filed Vitals:   06/19/15 0925  PainSc: 0-No pain                 VAN STAVEREN,Camora Tremain

## 2015-06-19 NOTE — Addendum Note (Signed)
Addendum  created 06/19/15 1008 by Johnna Acosta, CRNA   Modules edited: Anesthesia Blocks and Procedures, Clinical Notes   Clinical Notes:  File: [id number]

## 2015-06-19 NOTE — Care Management Note (Signed)
Case Management Note  Patient Details  Name: Cynthia Castillo MRN: [id number] Date of Birth: 30-Jan-1940  Subjective/Objective:                  Met with patient and her family to discuss discharge planning. She had surgery about a year ago and was told that she didn't need to attend joint class. She anticipates SNF at discharge. She states she lives alone and her family lives in different counties and cannot assist her. She also states she has stairs to get into the home. She has a standard walker (no front-wheeled walker). She uses [facility] for Rx.  Action/Plan: List of home health agencies left with patient. PT pending; insurance auth pending. CSW updated. RNCM to continue to follow.   Expected Discharge Date:                  Expected Discharge Plan:     In-House Referral:     Discharge planning Services  CM Consult  Post Acute Care Choice:  Home Health, Durable Medical Equipment Choice offered to:  Patient, Adult Children  DME Arranged:    DME Agency:     HH Arranged:    [place] Agency:     Status of Service:  In process, will continue to follow  Medicare Important Message Given:    Date Medicare IM Given:    Medicare IM give by:    Date Additional Medicare IM Given:    Additional Medicare Important Message give by:     If discussed at [place] of Stay Meetings, dates discussed:    Additional Comments:  Marshell Garfinkel, RN 06/19/2015, 11:27 AM

## 2015-06-20 LAB — CBC
HEMATOCRIT: 32.2 % — AB (ref 35.0–47.0)
HEMOGLOBIN: 10.5 g/dL — AB (ref 12.0–16.0)
MCH: 26.4 pg (ref 26.0–34.0)
MCHC: 32.7 g/dL (ref 32.0–36.0)
MCV: 80.6 fL (ref 80.0–100.0)
Platelets: 395 10*3/uL (ref 150–440)
RBC: 4 MIL/uL (ref 3.80–5.20)
RDW: 14.7 % — ABNORMAL HIGH (ref 11.5–14.5)
WBC: 16.9 10*3/uL — ABNORMAL HIGH (ref 3.6–11.0)

## 2015-06-20 LAB — BASIC METABOLIC PANEL
Anion gap: 6 (ref 5–15)
BUN: 13 mg/dL (ref 6–20)
CHLORIDE: 109 mmol/L (ref 101–111)
CO2: 24 mmol/L (ref 22–32)
CREATININE: 0.97 mg/dL (ref 0.44–1.00)
Calcium: 9.2 mg/dL (ref 8.9–10.3)
GFR, EST NON AFRICAN AMERICAN: 57 mL/min — AB (ref >60)
Glucose, Bld: 120 mg/dL — ABNORMAL HIGH (ref 65–99)
POTASSIUM: 3.8 mmol/L (ref 3.5–5.1)
SODIUM: 139 mmol/L (ref 135–145)

## 2015-06-20 LAB — GLUCOSE, CAPILLARY
GLUCOSE-CAPILLARY: 167 mg/dL — AB (ref 65–99)
Glucose-Capillary: 115 mg/dL — ABNORMAL HIGH (ref 65–99)
Glucose-Capillary: 138 mg/dL — ABNORMAL HIGH (ref 65–99)
Glucose-Capillary: 112 mg/dL — ABNORMAL HIGH (ref 65–99)

## 2015-06-20 MED ORDER — BISACODYL 10 MG RE SUPP: 10.0000 mg | Freq: Every day | RECTAL | Status: DC | PRN

## 2015-06-20 NOTE — Care Management (Signed)
PT evaluation recommending SNF.  CSW aware.  RNCM available for discharge planning if needed.

## 2015-06-20 NOTE — Progress Notes (Signed)
   Subjective: 1 Day Post-Op Procedure(s) (LRB): TOTAL KNEE ARTHROPLASTY (Left) Patient reports pain as 0 on 0-10 scale.   Patient is well, and has had no acute complaints or problems. Mild nausea yesterday that has resolved. Denies any CP, SOB, ABD pain. We will continue therapy today.  Plan is to go Skilled nursing facility after hospital stay.  Objective: Vital signs in last 24 hours: Temp:  [97.4 F (36.3 C)-98.2 F (36.8 C)] 97.8 F (36.6 C) (05/31 0450) Pulse Rate:  [60-91] 71 (05/31 0732) Resp:  [13-19] 18 (05/31 0732) BP: (97-130)/(46-83) 117/54 mmHg (05/31 0732) SpO2:  [93 %-100 %] 99 % (05/31 0732)  Intake/Output from previous day: 05/30 0701 - 05/31 0700 In: 3321.3 [P.O.:480; I.V.:2341.3; IV Piggyback:150] Out: 1400 [Urine:1350; Blood:50] Intake/Output this shift:     Recent Labs  06/19/15 1043 06/20/15 0545  HGB 11.4* 10.5*    Recent Labs  06/19/15 1043 06/20/15 0545  WBC 7.6 16.9*  RBC 4.28 4.00  HCT 34.3* 32.2*  PLT 373 395    Recent Labs  06/19/15 1043 06/20/15 0545  NA  --  139  K  --  3.8  CL  --  109  CO2  --  24  BUN  --  13  CREATININE 1.07* 0.97  GLUCOSE  --  120*  CALCIUM  --  9.2   No results for input(s): LABPT, INR in the last 72 hours.  EXAM General - Patient is Alert, Appropriate and Oriented Extremity - Neurovascular intact Sensation intact distally Intact pulses distally Dorsiflexion/Plantar flexion intact No cellulitis present Dressing - dressing C/D/I and no drainage Motor Function - intact, moving foot and toes well on exam.   Past Medical History  Diagnosis Date  . Hypertension   . Arthritis     hands  . Murmur   . Complication of anesthesia   . PONV (postoperative nausea and vomiting)   . Diabetes mellitus without complication ([place]) 123456    diet control    Assessment/Plan:   1 Day Post-Op Procedure(s) (LRB): TOTAL KNEE ARTHROPLASTY (Left) Active Problems:   Primary osteoarthritis of left  knee  Estimated body mass index is 29.87 kg/(m^2) as calculated from the following:   Height as of this encounter: 5\' 1"  (1.549 m).   Weight as of this encounter: 71.668 kg (158 lb). Advance diet Up with therapy  Needs BM Recheck labs in the am Plan on discharge to rehab.  DVT Prophylaxis - Lovenox, Foot Pumps and TED hose Weight-Bearing as tolerated to left leg D/C O2 and Pulse OX and try on Room Air  T. Rachelle Hora, PA-C [place] 06/20/2015, 7:49 AM

## 2015-06-20 NOTE — Progress Notes (Signed)
Physical Therapy Treatment Patient Details Name: LUCINDY KOENEMAN MRN: [id number] DOB: [birth year] Today's Date: 06/20/2015    History of Present Illness Pt admitted for L TKR. Pt with history of R TKR 1 year ago. Pt discharged to rehab at that time    PT Comments    Pt is making good progress towards goals with increased gait distance performed this session. Pt with slightly increased pain and needs heavy cues for sequencing and safe technique. Pt motivated to perform therapy. Good progress with ROM. Pt sits with L LE in IR, educated about neutral rotation.  Follow Up Recommendations  SNF     Equipment Recommendations       Recommendations for Other Services       Precautions / Restrictions Precautions Precautions: Fall Restrictions Weight Bearing Restrictions: Yes LLE Weight Bearing: Weight bearing as tolerated    Mobility  Bed Mobility Overal bed mobility: Needs Assistance             General bed mobility comments: not performed as pt received seated at EOB with RN  Transfers Overall transfer level: Needs assistance Equipment used: Rolling walker (2 wheeled) Transfers: Sit to/from Stand Sit to Stand: Min assist         General transfer comment: Pt with limited weight shifting on L LE during transfer with heavy assist for initial momentum. Pt then able to stand with cga and rw.  Ambulation/Gait Ambulation/Gait assistance: Min assist Ambulation Distance (Feet): 40 Feet Assistive device: Rolling walker (2 wheeled) Gait Pattern/deviations: Step-to pattern     General Gait Details: ambulated to recliner with step to gait pattern progressing to reciprocal gait pattern. Slow gait speed noted. Pt with slightly increased pain with mobility   Stairs            Wheelchair Mobility    Modified Rankin (Stroke Patients Only)       Balance                                    Cognition Arousal/Alertness: Awake/alert Behavior During  Therapy: WFL for tasks assessed/performed Overall Cognitive Status: Within Functional Limits for tasks assessed                      Exercises Total Joint Exercises Goniometric ROM: L LE knee AAROM: 11-83 degrees Other Exercises Other Exercises: Seated ther-ex performed x 12 reps on L LE including ankle pumps, quad sets, SLRs, SAQ, and hip abd/add. Pt also performed seated knee flexion stretches x 10 reps. All ther-ex performed with cga or min assist and cues and correct technique.    General Comments        Pertinent Vitals/Pain Pain Assessment: 0-10 Pain Score: 1  Pain Descriptors / Indicators: Operative site guarding;Dull;Discomfort Pain Intervention(s): Limited activity within patient's tolerance;Patient requesting pain meds-RN notified;Ice applied    Home Living                      Prior Function            PT Goals (current goals can now be found in the care plan section) Acute Rehab PT Goals Patient Stated Goal: to get stronger PT Goal Formulation: With patient Time For Goal Achievement: 07/03/15 Potential to Achieve Goals: Good Progress towards PT goals: Progressing toward goals    Frequency  BID    PT Plan Current plan remains appropriate  Co-evaluation             End of Session Equipment Utilized During Treatment: Gait belt Activity Tolerance: Patient tolerated treatment well Patient left: in chair;with chair alarm set (with OT)     Time: [id number] PT Time Calculation (min) (ACUTE ONLY): 24 min  Charges:  $Gait Training: 8-22 mins $Therapeutic Exercise: 8-22 mins                    G Codes:      Alixandria Friedt 2015-06-23, 10:41 AM  Greggory Stallion, PT, DPT [phone]

## 2015-06-20 NOTE — Clinical Social Work Placement (Signed)
   CLINICAL SOCIAL WORK PLACEMENT  NOTE  Date:  06/20/2015  Patient Details  Name: Cynthia Castillo MRN: [id number] Date of Birth: 1940-09-22  Clinical Social Work is seeking post-discharge placement for this patient at the [place] level of care (*CSW will initial, date and re-position this form in  chart as items are completed):  Yes   Patient/family provided with [facility] Work Department's list of facilities offering this level of care within the geographic area requested by the patient (or if unable, by the patient's family).  Yes   Patient/family informed of their freedom to choose among providers that offer the needed level of care, that participate in Medicare, Medicaid or managed care program needed by the patient, have an available bed and are willing to accept the patient.  Yes   Patient/family informed of [facility]'s ownership interest in [facility] and [facility], as well as of the fact that they are under no obligation to receive care at these facilities.  PASRR submitted to EDS on       PASRR number received on       Existing PASRR number confirmed on 06/20/15     FL2 transmitted to all facilities in geographic area requested by pt/family on 06/20/15     FL2 transmitted to all facilities within larger geographic area on       Patient informed that his/her managed care company has contracts with or will negotiate with certain facilities, including the following:        Yes   Patient/family informed of bed offers received.  Patient chooses bed at  [facility] )     Physician recommends and patient chooses bed at      Patient to be transferred to   on  .  Patient to be transferred to facility by       Patient family notified on   of transfer.  Name of family member notified:        PHYSICIAN       Additional Comment:    _______________________________________________ Loralyn Freshwater, LCSW 06/20/2015,  3:06 PM

## 2015-06-20 NOTE — Progress Notes (Signed)
Physical Therapy Treatment Patient Details Name: Cynthia Castillo MRN: [id number] DOB: [birth year] Today's Date: 06/20/2015    History of Present Illness Pt admitted for L TKR. Pt with history of R TKR 1 year ago. Pt discharged to rehab at that time    PT Comments    Pt gradually progressing towards functional goals. She is somewhat limited by knee pain but able to participate. Pt ambulated up to 35 ft with FWW and min A with slow gait. No LOB or buckling, however significant limping due to pain. Educated pt on technique pushing down with UEs and step to pattern to improve gait and reduce pain. Progress therex, ROM and mobility as tolerated.  Follow Up Recommendations  SNF     Equipment Recommendations       Recommendations for Other Services       Precautions / Restrictions Precautions Precautions: Fall Restrictions Weight Bearing Restrictions: Yes RLE Weight Bearing: Weight bearing as tolerated LLE Weight Bearing: Weight bearing as tolerated    Mobility  Bed Mobility Overal bed mobility: Needs Assistance Bed Mobility: Supine to Sit;Sit to Supine     Supine to sit: Min assist Sit to supine: Min assist   General bed mobility comments: difficulty moving L LE  Transfers Overall transfer level: Needs assistance Equipment used: Rolling walker (2 wheeled) Transfers: Sit to/from [facility] Sit to Stand: Min assist Stand pivot transfers: Min assist       General transfer comment: C/o pain with weight bearing. Cues for pushing down with FWW to reduce pain in knee.  Ambulation/Gait Ambulation/Gait assistance: Min assist Ambulation Distance (Feet): 70 Feet Assistive device: Rolling walker (2 wheeled) Gait Pattern/deviations: Step-to pattern;Antalgic;Trunk flexed Gait velocity: reduced Gait velocity interpretation: Below normal speed for age/gender General Gait Details: Slow speed. Generaly steady but limps with L knee pain. Cues for increased step length  and staying close to FWW to reduce energy expenditure and work of L LE, as well as for safety.   Stairs            Wheelchair Mobility    Modified Rankin (Stroke Patients Only)       Balance Overall balance assessment: Needs assistance Sitting-balance support: Feet supported Sitting balance-Leahy Scale: Good     Standing balance support: Bilateral upper extremity supported Standing balance-Leahy Scale: Good Standing balance comment: steady with UE support                    Cognition Arousal/Alertness: Awake/alert Behavior During Therapy: WFL for tasks assessed/performed Overall Cognitive Status: Within Functional Limits for tasks assessed                      Exercises Other Exercises Other Exercises: L LE supine therex: ankle pumps, QS, SAQs, SLR, hip abd slides and heel slides with AAROM as needed x15 each. Limited by pain. Cues for technique. Other Exercises: Pt ambulated 70 ft with FWW and min A with slow speed but generally steady. Significant limp noted due to pain in L knee. Cues for staying close to [facility] and technique to reduce weight bearing in L LE to decrease pain.    General Comments General comments (skin integrity, edema, etc.): L knee bandaged and polar care applied      Pertinent Vitals/Pain Pain Assessment:  (denies at rest, but voices pain with mobility) Pain Score: 4  Pain Location: L knee Pain Descriptors / Indicators: Operative site guarding Pain Intervention(s): Limited activity within patient's tolerance;Monitored during session;Repositioned;Ice  applied    Home Living Family/patient expects to be discharged to:: Private residence Living Arrangements: Alone   Type of Home: House Home Access: Stairs to enter Entrance Stairs-Rails: None Home Layout: Multi-level Home Equipment: Walker - standard;Cane - single point;Toilet riser      Prior Function            PT Goals (current goals can now be found in the care plan  section) Acute Rehab PT Goals Patient Stated Goal: to get stronger PT Goal Formulation: With patient Time For Goal Achievement: 07/03/15 Potential to Achieve Goals: Good Progress towards PT goals: Progressing toward goals    Frequency  BID    PT Plan Current plan remains appropriate    Co-evaluation             End of Session Equipment Utilized During Treatment: Gait belt Activity Tolerance: Patient tolerated treatment well;Patient limited by pain Patient left: in bed;with call bell/phone within reach;with bed alarm set;with SCD's reapplied     Time: [id number] PT Time Calculation (min) (ACUTE ONLY): 28 min  Charges:  $Gait Training: 8-22 mins $Therapeutic Exercise: 8-22 mins                    G Codes:      Neoma Laming, PT, DPT  06/20/2015, 2:50 PM [phone]

## 2015-06-20 NOTE — Clinical Social Work Note (Signed)
Clinical Social Work Assessment  Patient Details  Name: Cynthia Castillo MRN: [id number] Date of Birth: [birth year]  Date of referral:  06/20/15               Reason for consult:  Facility Placement                Permission sought to share information with:  Chartered certified accountant granted to share information::  Yes, Verbal Permission Granted  Name::      [place]::   [place]   Relationship::     Contact Information:     Housing/Transportation Living arrangements for the past 2 months:  [place] of Information:  Patient Patient Interpreter Needed:  None Criminal Activity/Legal Involvement Pertinent to Current Situation/Hospitalization:  No - Comment as needed Significant Relationships:  Other Family Members Lives with:  Self Do you feel safe going back to the place where you live?  Yes Need for family participation in patient care:  Yes (Comment)  Care giving concerns:  Patient lives in [place] alone.    Social Worker assessment / plan:  Holiday representative (CSW) received SNF consult. PT is recommending SNF. CSW met with patient alone at bedside. CSW introduced self and explained role of CSW department. Patient was alert and oriented and sitting up in the bed. Patient reported that she lives alone and wants to go to rehab. CSW explained SNF process. Patient is agreeable to SNF search in [facility] FL2 complete and faxed out.   CSW presented bed offers to patient. Patient chose [facility]. [place] admissions coordinator at [facility] is aware of accepted bed offer. CSW will continue to follow and assist as needed.   Employment status:  Retired Nurse, adult PT Recommendations:  [place] / Referral to community resources:  [place]  Patient/Family's Response to care:  Patient is agreeable to go to [facility].   Patient/Family's Understanding of and Emotional Response to Diagnosis, Current Treatment, and Prognosis:  Patient was pleasant and thanked CSW for visit.   Emotional Assessment Appearance:  Appears stated age Attitude/Demeanor/Rapport:    Affect (typically observed):  Accepting, Adaptable, Pleasant Orientation:  Oriented to Self, Oriented to Place, Oriented to  Time, Oriented to Situation Alcohol / Substance use:  Not Applicable Psych involvement (Current and /or in the community):  No (Comment)  Discharge Needs  Concerns to be addressed:  Discharge Planning Concerns Readmission within the last 30 days:  No Current discharge risk:  Dependent with Mobility Barriers to Discharge:  Continued Medical Work up   Loralyn Freshwater, LCSW 06/20/2015, 3:15 PM

## 2015-06-20 NOTE — NC FL2 (Signed)
[place] LEVEL OF CARE SCREENING TOOL     IDENTIFICATION  Patient Name: Cynthia Castillo Birthdate: [birth year] Sex: female Admission Date (Current Location): 06/19/2015  [place] and [place] Number:  Engineering geologist and Address:  [facility], [address] [place], [place] [address]      Provider Number: [id number]  Attending Physician Name and Address:  Hessie Knows, MD  Relative Name and Phone Number:       Current Level of Care: Hospital Recommended Level of Care: [place] Prior Approval Number:    Date Approved/Denied:   PASRR Number:  ( [id number] )  Discharge Plan: SNF    Current Diagnoses: Patient Active Problem List   Diagnosis Date Noted  . Primary osteoarthritis of left knee 06/19/2015  . Encounter for screening colonoscopy 01/31/2015  . Arthritis 01/01/2015  . Allergic rhinitis 08/15/2014  . Arthritis of knee 08/15/2014  . Endometriosis 08/15/2014  . HLD (hyperlipidemia) 08/15/2014  . Adult hypothyroidism 08/15/2014  . Adiposity 08/15/2014  . Primary osteoarthritis of knee 07/25/2014  . Mastalgia 02/01/2014  . Diabetes mellitus, type 2 ([place]) 12/27/2007  . Avitaminosis D 12/27/2007  . Deficiency, disaccharidase intestinal 09/18/2005  . Allergic reaction 12/09/1994  . Bloodgood disease 10/19/1990  . Essential (primary) hypertension 10/19/1990    Orientation RESPIRATION BLADDER Height & Weight     Self, Time, Situation, Place  Normal Continent Weight: 158 lb (71.668 kg) Height:  5\' 1"  (154.9 cm)  BEHAVIORAL SYMPTOMS/MOOD NEUROLOGICAL BOWEL NUTRITION STATUS   (none )  (none ) Continent Diet (Diet: Carb Modified )  AMBULATORY STATUS COMMUNICATION OF NEEDS Skin   Extensive Assist Verbally Surgical wounds (Incision: Left Knee )                       Personal Care Assistance Level of Assistance  Bathing, Feeding, Dressing Bathing Assistance: Limited assistance Feeding assistance:  Independent Dressing Assistance: Limited assistance     Functional Limitations Info  Sight, Hearing, Speech Sight Info: Adequate Hearing Info: Adequate Speech Info: Adequate    SPECIAL CARE FACTORS FREQUENCY  PT (By licensed PT), OT (By licensed OT)     PT Frequency:  (5) OT Frequency:  (5)            Contractures      Additional Factors Info  Code Status, Allergies, Insulin Sliding Scale Code Status Info:  (Full Code. ) Allergies Info:  (Latex)   Insulin Sliding Scale Info:  (NovoLog Insulin Injections 3 times per day. )       Current Medications (06/20/2015):  This is the current hospital active medication list Current Facility-Administered Medications  Medication Dose Route Frequency Provider Last Rate Last Dose  . 0.9 %  sodium chloride infusion   Intravenous Continuous Hessie Knows, MD 75 mL/hr at 06/20/15 0340    . acetaminophen (TYLENOL) tablet 650 mg  650 mg Oral Q6H PRN Hessie Knows, MD       Or  . acetaminophen (TYLENOL) suppository 650 mg  650 mg Rectal Q6H PRN Hessie Knows, MD      . alum & mag hydroxide-simeth (MAALOX/MYLANTA) 200-200-20 MG/5ML suspension 30 mL  30 mL Oral Q4H PRN Hessie Knows, MD      . irbesartan Levy Sjogren) tablet 300 mg  300 mg Oral Daily Hessie Knows, MD       And  . amLODipine (NORVASC) tablet 10 mg  10 mg Oral Daily Hessie Knows, MD  And  . hydrochlorothiazide (HYDRODIURIL) tablet 25 mg  25 mg Oral Daily Hessie Knows, MD      . bisacodyl (DULCOLAX) EC tablet 5 mg  5 mg Oral Daily PRN Hessie Knows, MD      . bisacodyl (DULCOLAX) suppository 10 mg  10 mg Rectal Daily PRN Duanne Guess, PA-C      . diphenhydrAMINE (BENADRYL) 12.5 MG/5ML elixir 12.5-25 mg  12.5-25 mg Oral Q4H PRN Hessie Knows, MD      . docusate sodium (COLACE) capsule 100 mg  100 mg Oral BID Hessie Knows, MD   100 mg at 06/19/15 2143  . enoxaparin (LOVENOX) injection 30 mg  30 mg Subcutaneous Q12H Hessie Knows, MD      . insulin aspart (novoLOG) injection 0-15  Units  0-15 Units Subcutaneous TID [facility] Hessie Knows, MD   3 Units at 06/19/15 1645  . magnesium citrate solution 1 Bottle  1 Bottle Oral Once PRN Hessie Knows, MD      . magnesium hydroxide (MILK OF MAGNESIA) suspension 30 mL  30 mL Oral Daily PRN Hessie Knows, MD      . menthol-cetylpyridinium (CEPACOL) lozenge 3 mg  1 lozenge Oral PRN Hessie Knows, MD       Or  . phenol (CHLORASEPTIC) mouth spray 1 spray  1 spray Mouth/Throat PRN Hessie Knows, MD      . methocarbamol (ROBAXIN) tablet 500 mg  500 mg Oral Q6H PRN Hessie Knows, MD       Or  . methocarbamol (ROBAXIN) 500 mg in dextrose 5 % 50 mL IVPB  500 mg Intravenous Q6H PRN Hessie Knows, MD      . metoCLOPramide (REGLAN) tablet 5-10 mg  5-10 mg Oral Q8H PRN Hessie Knows, MD   10 mg at 06/19/15 1611   Or  . metoCLOPramide (REGLAN) injection 5-10 mg  5-10 mg Intravenous Q8H PRN Hessie Knows, MD      . mometasone (ELOCON) 0.1 % cream 1 application  1 application Topical Daily PRN Hessie Knows, MD      . morphine 2 MG/ML injection 2 mg  2 mg Intravenous Q1H PRN Hessie Knows, MD      . multivitamin with minerals tablet 1 tablet  1 tablet Oral Daily Hessie Knows, MD   1 tablet at 06/19/15 1800  . ondansetron (ZOFRAN) tablet 4 mg  4 mg Oral Q6H PRN Hessie Knows, MD       Or  . ondansetron [facility]) injection 4 mg  4 mg Intravenous Q6H PRN Hessie Knows, MD   4 mg at 06/19/15 1835  . oxyCODONE (Oxy IR/ROXICODONE) immediate release tablet 5-10 mg  5-10 mg Oral Q3H PRN Hessie Knows, MD   5 mg at 06/19/15 1611  . zolpidem (AMBIEN) tablet 5 mg  5 mg Oral QHS PRN Hessie Knows, MD         Discharge Medications: Please see discharge summary for a list of discharge medications.  Relevant Imaging Results:  Relevant Lab Results:   Additional Information  (SSN: [id number])  Loralyn Freshwater, LCSW

## 2015-06-20 NOTE — Evaluation (Signed)
Occupational Therapy Evaluation Patient Details Name: MYKENZIE MCCAIG MRN: [id number] DOB: 08/27/1940 Today's Date: 06/20/2015    History of Present Illness Pt admitted for L TKR. Pt with history of R TKR 1 year ago. Pt discharged to rehab at that time   Clinical Impression   Pt. Is a 75 y.o. female who was admitted for a left TKR. Pt presents with limited ROM, Pain, weakness, and impaired functional mobility which hinder her ability to complete ADL and IADL tasks. Pt. could benefit from skilled OT services to review A/E use for LE ADLs, to review necessary home modifications, and to improve functional mobility for ADL/IADLs in order to work towards regaining Independence with ADL/IADLs.     Follow Up Recommendations  Home health OT    Equipment Recommendations       Recommendations for Other Services       Precautions / Restrictions Precautions Precautions: Fall Restrictions Weight Bearing Restrictions: Yes RLE Weight Bearing: Weight bearing as tolerated LLE Weight Bearing: Weight bearing as tolerated      Mobility    Balance                                            ADL Overall ADL's : Needs assistance/impaired Eating/Feeding: Set up   Grooming: Set up           Upper Body Dressing : Moderate assistance   Lower Body Dressing: Maximal assistance                       Vision     Perception     Praxis      Pertinent Vitals/Pain Pain Assessment: 0-10 Pain Score: 4  Pain Location: Left knee Pain Descriptors / Indicators: Operative site guarding;Dull;Discomfort Pain Intervention(s): Limited activity within patient's tolerance;Patient requesting pain meds-RN notified;Ice applied     Hand Dominance     Extremity/Trunk Assessment Upper Extremity Assessment Upper Extremity Assessment: Overall WFL for tasks assessed   Lower Extremity Assessment Lower Extremity Assessment: Generalized weakness       Communication  Communication Communication: No difficulties   Cognition Arousal/Alertness: Awake/alert Behavior During Therapy: WFL for tasks assessed/performed Overall Cognitive Status: Within Functional Limits for tasks assessed                     General Comments       Exercises     Shoulder Instructions      Home Living Family/patient expects to be discharged to:: Private residence Living Arrangements: Alone   Type of Home: House Home Access: Stairs to enter [employer] of Steps: 2 Entrance Stairs-Rails: None Home Layout: Multi-level Alternate Level Stairs-Number of Steps: 3 Alternate Level Stairs-Rails: Can reach both Bathroom Shower/Tub: Walk-in Hydrologist: Standard Bathroom Accessibility: Yes   Home Equipment: Walker - standard;Cane - single point;Toilet riser          Prior Functioning/Environment               OT Diagnosis: Generalized weakness;Acute pain   OT Problem List: Decreased strength;Decreased range of motion;Pain;Decreased knowledge of use of DME or AE   OT Treatment/Interventions: Self-care/ADL training;Therapeutic exercise;DME and/or AE instruction;Patient/family education;Therapeutic activities    OT Goals(Current goals can be found in the care plan section) Acute Rehab OT Goals Patient Stated Goal: To regain indepdence OT Goal Formulation: With patient/family  Time For Goal Achievement: 07/04/15 Potential to Achieve Goals: Good  OT Frequency: Min 1X/week   Barriers to D/C:            Co-evaluation              End of Session    Activity Tolerance: Patient tolerated treatment well Patient left: in chair;with call bell/phone within reach;with chair alarm set   Time: [id number] OT Time Calculation (min): 28 min Charges:  OT General Charges $OT Visit: 1 Procedure OT Evaluation $OT Eval Moderate Complexity: 1 Procedure G-Codes:    Harrel Carina, MS, OTR/L Harrel Carina 06/20/2015,  11:47 AM

## 2015-06-21 ENCOUNTER — Encounter: Payer: Self-pay | Admitting: General Surgery

## 2015-06-21 DIAGNOSIS — M25562 Pain in left knee: Secondary | ICD-10-CM | POA: Diagnosis not present

## 2015-06-21 DIAGNOSIS — E039 Hypothyroidism, unspecified: Secondary | ICD-10-CM | POA: Diagnosis not present

## 2015-06-21 DIAGNOSIS — M1712 Unilateral primary osteoarthritis, left knee: Secondary | ICD-10-CM | POA: Diagnosis not present

## 2015-06-21 DIAGNOSIS — M6281 Muscle weakness (generalized): Secondary | ICD-10-CM | POA: Diagnosis not present

## 2015-06-21 DIAGNOSIS — E1143 Type 2 diabetes mellitus with diabetic autonomic (poly)neuropathy: Secondary | ICD-10-CM | POA: Diagnosis not present

## 2015-06-21 DIAGNOSIS — Z7401 Bed confinement status: Secondary | ICD-10-CM | POA: Diagnosis not present

## 2015-06-21 DIAGNOSIS — M158 Other polyosteoarthritis: Secondary | ICD-10-CM | POA: Diagnosis not present

## 2015-06-21 DIAGNOSIS — E119 Type 2 diabetes mellitus without complications: Secondary | ICD-10-CM | POA: Diagnosis not present

## 2015-06-21 DIAGNOSIS — Z96651 Presence of right artificial knee joint: Secondary | ICD-10-CM | POA: Diagnosis not present

## 2015-06-21 DIAGNOSIS — R262 Difficulty in walking, not elsewhere classified: Secondary | ICD-10-CM | POA: Diagnosis not present

## 2015-06-21 DIAGNOSIS — J3089 Other allergic rhinitis: Secondary | ICD-10-CM | POA: Diagnosis not present

## 2015-06-21 DIAGNOSIS — E559 Vitamin D deficiency, unspecified: Secondary | ICD-10-CM | POA: Diagnosis not present

## 2015-06-21 DIAGNOSIS — Z96652 Presence of left artificial knee joint: Secondary | ICD-10-CM | POA: Diagnosis not present

## 2015-06-21 DIAGNOSIS — I10 Essential (primary) hypertension: Secondary | ICD-10-CM | POA: Diagnosis not present

## 2015-06-21 DIAGNOSIS — M1990 Unspecified osteoarthritis, unspecified site: Secondary | ICD-10-CM | POA: Diagnosis not present

## 2015-06-21 DIAGNOSIS — Z713 Dietary counseling and surveillance: Secondary | ICD-10-CM | POA: Diagnosis not present

## 2015-06-21 DIAGNOSIS — E785 Hyperlipidemia, unspecified: Secondary | ICD-10-CM | POA: Diagnosis not present

## 2015-06-21 LAB — CBC
HEMATOCRIT: 33.9 % — AB (ref 35.0–47.0)
Hemoglobin: 11.2 g/dL — ABNORMAL LOW (ref 12.0–16.0)
MCH: 26.1 pg (ref 26.0–34.0)
MCHC: 33 g/dL (ref 32.0–36.0)
MCV: 79 fL — ABNORMAL LOW (ref 80.0–100.0)
PLATELETS: 380 10*3/uL (ref 150–440)
RBC: 4.29 MIL/uL (ref 3.80–5.20)
RDW: 14.5 % (ref 11.5–14.5)
WBC: 12 10*3/uL — AB (ref 3.6–11.0)

## 2015-06-21 LAB — GLUCOSE, CAPILLARY
GLUCOSE-CAPILLARY: 128 mg/dL — AB (ref 65–99)
Glucose-Capillary: 166 mg/dL — ABNORMAL HIGH (ref 65–99)
Glucose-Capillary: 148 mg/dL — ABNORMAL HIGH (ref 65–99)

## 2015-06-21 LAB — SURGICAL PATHOLOGY

## 2015-06-21 MED ORDER — ENOXAPARIN SODIUM 40 MG/0.4ML ~~LOC~~ SOLN: 40.0000 mg | SUBCUTANEOUS | Status: DC

## 2015-06-21 MED ORDER — OXYCODONE HCL 5 MG PO TABS: 5.0000 mg | ORAL_TABLET | ORAL | Status: DC | PRN

## 2015-06-21 NOTE — Progress Notes (Signed)
Report called to [facility]. Pt will need EMS for transport. BM pending, mag citrate given this AM.

## 2015-06-21 NOTE — Clinical Social Work Note (Signed)
Pt is ready for discharge today to [facility]. Pt is aware and agreeable to discharge plan. Facility is ready to admit pt as they have received discharge information. RN called report and EMS will provide transportation to facility. Pt shared that she will follow up with her family as they are at work. CSW is signing off as no further needs identified.   Darden Dates, MSW, LCSW  Clinical Social Worker  [phone]

## 2015-06-21 NOTE — Discharge Summary (Signed)
Physician Discharge Summary  Patient ID: Cynthia Castillo MRN: [id number] DOB/AGE: 1940-08-25 75 y.o.  Admit date: 06/19/2015 Discharge date: 06/21/2015  Admission Diagnoses:  OSTEOARTHRITIS   Discharge Diagnoses: Patient Active Problem List   Diagnosis Date Noted  . Primary osteoarthritis of left knee 06/19/2015  . Encounter for screening colonoscopy 01/31/2015  . Arthritis 01/01/2015  . Allergic rhinitis 08/15/2014  . Arthritis of knee 08/15/2014  . Endometriosis 08/15/2014  . HLD (hyperlipidemia) 08/15/2014  . Adult hypothyroidism 08/15/2014  . Adiposity 08/15/2014  . Primary osteoarthritis of knee 07/25/2014  . Mastalgia 02/01/2014  . Diabetes mellitus, type 2 ([place]) 12/27/2007  . Avitaminosis D 12/27/2007  . Deficiency, disaccharidase intestinal 09/18/2005  . Allergic reaction 12/09/1994  . Bloodgood disease 10/19/1990  . Essential (primary) hypertension 10/19/1990    Past Medical History  Diagnosis Date  . Hypertension   . Arthritis     hands  . Murmur   . Complication of anesthesia   . PONV (postoperative nausea and vomiting)   . Diabetes mellitus without complication ([place]) 123456    diet control     Transfusion: none   Consultants (if any):    Discharged Condition: Improved  Hospital Course: Cynthia Castillo is an 75 y.o. female who was admitted 06/19/2015 with a diagnosis of left knee osteoarthritis and went to the operating room on 06/19/2015 and underwent the above named procedures.    Surgeries: Procedure(s): TOTAL KNEE ARTHROPLASTY on 06/19/2015 Patient tolerated the surgery well. Taken to PACU where she was stabilized and then transferred to the orthopedic floor.  Started on Lovenox 30 q 12 hrs. Foot pumps applied bilaterally at 80 mm. Heels elevated on bed with rolled towels. No evidence of DVT. Negative [place]. Physical therapy started on day #1 for gait training and transfer. OT started day #1 for ADL and assisted devices.  Patient's foley was d/c on day  #1. Patient's IV was d/c on day #2.  On post op day #2 patient was stable and ready for discharge to SNF.  Implants: Medacta GMK sphere 2 femur left, 2 tibia with 11 stem, 12 mm flex insert, 2 patella all components cemented  She was given perioperative antibiotics:  Anti-infectives    Start     Dose/Rate Route Frequency Ordered Stop   06/19/15 1400  ceFAZolin (ANCEF) IVPB 1 g/50 mL premix     1 g 100 mL/hr over 30 Minutes Intravenous Every 6 hours 06/19/15 1022 06/20/15 0211   06/19/15 0600  ceFAZolin (ANCEF) IVPB 2g/100 mL premix     2 g 200 mL/hr over 30 Minutes Intravenous  Once 06/19/15 0557 06/19/15 0747   06/19/15 0558  ceFAZolin (ANCEF) 2-4 GM/100ML-% IVPB    Comments:  [place]: cabinet override      06/19/15 0558 06/19/15 1759    .  She was given sequential compression devices, early ambulation, and lovenox for DVT prophylaxis.  She benefited maximally from the hospital stay and there were no complications.    Recent vital signs:  Filed Vitals:   06/21/15 0511 06/21/15 0717  BP: 159/68 107/70  Pulse: 94 94  Temp: 98.3 F (36.8 C) 98.8 F (37.1 C)  Resp: 18 18    Recent laboratory studies:  Lab Results  Component Value Date   HGB 11.2* 06/21/2015   HGB 10.5* 06/20/2015   HGB 11.4* 06/19/2015   Lab Results  Component Value Date   WBC 12.0* 06/21/2015   PLT 380 06/21/2015   Lab Results  Component Value Date  INR 1.03 06/06/2015   Lab Results  Component Value Date   NA 139 06/20/2015   K 3.8 06/20/2015   CL 109 06/20/2015   CO2 24 06/20/2015   BUN 13 06/20/2015   CREATININE 0.97 06/20/2015   GLUCOSE 120* 06/20/2015    Discharge Medications:     Medication List    TAKE these medications        BAYER CONTOUR NEXT TEST test strip  Generic drug:  glucose blood  Reported on 01/31/2015     enoxaparin 40 MG/0.4ML injection  Commonly known as:  LOVENOX  Inject 0.4 mLs (40 mg total) into the skin daily. X 14 days     Fish Oil 1200 MG  Caps  Reported on 15-Jul-2015     mometasone 0.1 % cream  Commonly known as:  ELOCON  Apply 1 application topically daily. Apply small amount to the external ear. Use only for 2 weeks at a time.     multivitamin capsule  Take 1 capsule by mouth daily.     Olmesartan-Amlodipine-HCTZ 40-10-25 MG Tabs  Commonly known as:  TRIBENZOR  Take 1 tablet by mouth daily.     oxyCODONE 5 MG immediate release tablet  Commonly known as:  Oxy IR/ROXICODONE  Take 1-2 tablets (5-10 mg total) by mouth every 3 (three) hours as needed for breakthrough pain.        Diagnostic Studies: Dg Knee 1-2 Views Left  Jul 15, 2015  CLINICAL DATA:  Postop day 0 left total knee arthroplasty for osteoarthritis. EXAM: LEFT KNEE - 1-2 VIEW COMPARISON:  Left knee CT 04/05/2015. FINDINGS: Left total knee arthroplasty with anatomic alignment. No acute complicating features. Air-fluid level in the joint as expected. Osseous demineralization. IMPRESSION: Anatomic alignment post left total knee arthroplasty without acute complicating features. Electronically Signed   By: Evangeline Dakin M.D.   On: 2015-07-15 09:43    Disposition: 01-Home or Self Care        Follow-up Information    Follow up with MENZ,MICHAEL, MD In 2 weeks.   Specialty:  Orthopedic Surgery   Why:  For staple removal and skin check   Contact information:   [address] [place] [address] [phone]        Signed: Dorise Hiss [facility] 06/21/2015, 8:08 AM

## 2015-06-21 NOTE — Progress Notes (Signed)
Physical Therapy Treatment Patient Details Name: Cynthia Castillo MRN: [id number] DOB: [birth year] Today's Date: 06/21/2015    History of Present Illness Pt admitted for L TKR. Pt with history of R TKR 1 year ago. Pt discharged to rehab at that time    PT Comments    Pt is making good progress towards goals with slightly increased ambulation distance this session. Pt motivated to perform therapy, however limited by pain. Pt able to void at [facility], RN notified. Good endurance with there-ex and improved sequencing with mobility. Still needs verbal cues to keep rw close to body.  Follow Up Recommendations  SNF     Equipment Recommendations       Recommendations for Other Services       Precautions / Restrictions Precautions Precautions: Fall Restrictions Weight Bearing Restrictions: Yes LLE Weight Bearing: Weight bearing as tolerated    Mobility  Bed Mobility Overal bed mobility: Needs Assistance Bed Mobility: Supine to Sit;Sit to Supine     Supine to sit: Min assist     General bed mobility comments: Pt has difficulty sequencing to transfer to EOB. Pt needs assist for trunk control. Once seated at EOB, pt able to sit with cga. Pt with slight LOB once seated at EOB.  Transfers Overall transfer level: Needs assistance Equipment used: Rolling walker (2 wheeled) Transfers: Sit to/from Stand Sit to Stand: Min assist         General transfer comment: safe technique performed with cues for pushing from seated surface. Once standing, able to stand with cga. Pt educated to keep RW close to body  Ambulation/Gait Ambulation/Gait assistance: Min assist Ambulation Distance (Feet): 40 Feet Assistive device: Rolling walker (2 wheeled) Gait Pattern/deviations: Step-to pattern     General Gait Details: slow speed noted during ambulation with step to gait pattern. Pt encouraged to keep upright posture. Heavy stance on R LE, however improved WB on B UE. Increased endurance noted this  session.   Stairs            Wheelchair Mobility    Modified Rankin (Stroke Patients Only)       Balance                                    Cognition Arousal/Alertness: Awake/alert Behavior During Therapy: WFL for tasks assessed/performed Overall Cognitive Status: Within Functional Limits for tasks assessed                      Exercises Other Exercises Other Exercises: supine ther-ex performed including ankle pumps, quad sets, glut sets, SLRs, hip ab/ad, and SAQ. All ther-ex performed x 15 reps with min assist and cues for technique Other Exercises: Pt ambulated to [facility] and needed min assist for sequencing and motor planning. Once seated, pt with L foot propped up. Pt able to perform self hygiene with cga.     General Comments        Pertinent Vitals/Pain Pain Assessment: 0-10 Pain Score: 6  Pain Location: L knee Pain Descriptors / Indicators: Operative site guarding Pain Intervention(s): Limited activity within patient's tolerance    Home Living                      Prior Function            PT Goals (current goals can now be found in the care plan section) Acute Rehab PT  Goals Patient Stated Goal: to get stronger PT Goal Formulation: With patient Time For Goal Achievement: 07/03/15 Potential to Achieve Goals: Good Progress towards PT goals: Progressing toward goals    Frequency  BID    PT Plan Current plan remains appropriate    Co-evaluation             End of Session Equipment Utilized During Treatment: Gait belt Activity Tolerance: Patient tolerated treatment well;Patient limited by pain Patient left: in bed;with bed alarm set     Time: [id number] PT Time Calculation (min) (ACUTE ONLY): 32 min  Charges:  $Gait Training: 8-22 mins $Therapeutic Exercise: 8-22 mins                    G Codes:      Lakota Markgraf July 01, 2015, 3:56 PM Greggory Stallion, PT, DPT [phone]

## 2015-06-21 NOTE — Progress Notes (Signed)
Physical Therapy Treatment Patient Details Name: Cynthia Castillo MRN: [id number] DOB: 03-16-1940 Today's Date: 06/21/2015    History of Present Illness Pt admitted for L TKR. Pt with history of R TKR 1 year ago. Pt discharged to rehab at that time    PT Comments    Pt is making limited progress towards goals secondary to pain. Increased cues for participation required this date, although pt does fairly well. Pt with increased pain this session. Increased difficulty with use of rw with increased cues for ambulation and weightbearing through B UE. Limited progress noted with AAROM. Continue to progress mobility as tolerated.  Follow Up Recommendations  SNF     Equipment Recommendations       Recommendations for Other Services       Precautions / Restrictions Precautions Precautions: Fall Restrictions Weight Bearing Restrictions: Yes LLE Weight Bearing: Weight bearing as tolerated    Mobility  Bed Mobility Overal bed mobility: Needs Assistance Bed Mobility: Supine to Sit;Sit to Supine     Supine to sit: Min assist     General bed mobility comments: Pt has difficulty scooting towards EOB. Min assist required for trunk support. Once seated at EOB, pt able to sit with cga  Transfers Overall transfer level: Needs assistance Equipment used: Rolling walker (2 wheeled) Transfers: Sit to/from Stand Sit to Stand: Mod assist         General transfer comment: Heavy assist required to stand from lower surface. Once standing, cues required for correct technique using rw.  Ambulation/Gait Ambulation/Gait assistance: Min assist Ambulation Distance (Feet): 20 Feet Assistive device: Rolling walker (2 wheeled) Gait Pattern/deviations: Step-to pattern     General Gait Details: slow speed with decreased stance time on L LE. Pt keeps rw too far away from body, needs cues to keep closer and to WB through B UE to offload L LE. Step to gait pattern performed, unable to perform reciprocal  gait.   Stairs            Wheelchair Mobility    Modified Rankin (Stroke Patients Only)       Balance                                    Cognition Arousal/Alertness: Awake/alert Behavior During Therapy: WFL for tasks assessed/performed Overall Cognitive Status: Within Functional Limits for tasks assessed                      Exercises Total Joint Exercises Goniometric ROM: L LE AAROM: 7-78 degrees Other Exercises Other Exercises: L LE supine ther-ex including ankle pumps, quad sets, SLRs, hip abd/add, and knee flexion stretches. All ther-ex performed x 15 reps with min assist and cues for correct technique    General Comments        Pertinent Vitals/Pain Pain Assessment: 0-10 Pain Score: 6  Pain Location: L knee Pain Descriptors / Indicators: Operative site guarding Pain Intervention(s): Limited activity within patient's tolerance;Ice applied    Home Living                      Prior Function            PT Goals (current goals can now be found in the care plan section) Acute Rehab PT Goals Patient Stated Goal: to get stronger PT Goal Formulation: With patient Time For Goal Achievement: 07/03/15 Potential to Achieve Goals: Good  Progress towards PT goals: Progressing toward goals    Frequency  BID    PT Plan Current plan remains appropriate    Co-evaluation             End of Session Equipment Utilized During Treatment: Gait belt Activity Tolerance: Patient tolerated treatment well;Patient limited by pain Patient left: in chair;with chair alarm set     Time: [phone] PT Time Calculation (min) (ACUTE ONLY): 23 min  Charges:  $Gait Training: 8-22 mins $Therapeutic Exercise: 8-22 mins                    G Codes:      Devri Kreher 06-26-15, 11:49 AM  Greggory Stallion, PT, DPT [phone]

## 2015-06-21 NOTE — Progress Notes (Signed)
Checked to see if pre-authorization would be needed for non-emergent EMS transport. Per [employer] benefits obtained online through [employer], patient has an [employer] Medicare Complete Choice PPO policy.  Medicare PPO plans do not require pre-auth for non-emergent ground transports using service codes A0426 or A0428.   

## 2015-06-21 NOTE — Clinical Social Work Placement (Signed)
   CLINICAL SOCIAL WORK PLACEMENT  NOTE  Date:  06/21/2015  Patient Details  Name: Cynthia Castillo MRN: [id number] Date of Birth: 02/09/1940  Clinical Social Work is seeking post-discharge placement for this patient at the [place] level of care (*CSW will initial, date and re-position this form in  chart as items are completed):  Yes   Patient/family provided with [place] Work Department's list of facilities offering this level of care within the geographic area requested by the patient (or if unable, by the patient's family).  Yes   Patient/family informed of their freedom to choose among providers that offer the needed level of care, that participate in Medicare, Medicaid or managed care program needed by the patient, have an available bed and are willing to accept the patient.  Yes   Patient/family informed of [facility]'s ownership interest in [facility] and [facility], as well as of the fact that they are under no obligation to receive care at these facilities.  PASRR submitted to EDS on       PASRR number received on       Existing PASRR number confirmed on 06/20/15     FL2 transmitted to all facilities in geographic area requested by pt/family on 06/20/15     FL2 transmitted to all facilities within larger geographic area on       Patient informed that his/her managed care company has contracts with or will negotiate with certain facilities, including the following:        Yes   Patient/family informed of bed offers received.  Patient chooses bed at  [facility] )     Physician recommends and patient chooses bed at  [facility])    Patient to be transferred to [facility] on 06/21/15.  Patient to be transferred to facility by [facility]     Patient family notified on 06/21/15 of transfer.  Name of family member notified:  Pt declined     PHYSICIAN       Additional Comment:     _______________________________________________ Darden Dates, LCSW 06/21/2015, 2:14 PM

## 2015-06-21 NOTE — Progress Notes (Signed)
   Subjective: 2 Days Post-Op Procedure(s) (LRB): TOTAL KNEE ARTHROPLASTY (Left) Patient reports pain as 0 on 0-10 scale.   Patient is well, and has had no acute complaints or problems.  Denies any CP, SOB, ABD pain. We will continue therapy today.  Plan is to go Skilled nursing facility after hospital stay.  Objective: Vital signs in last 24 hours: Temp:  [98.3 F (36.8 C)-98.8 F (37.1 C)] 98.8 F (37.1 C) (06/01 0717) Pulse Rate:  [83-106] 94 (06/01 0717) Resp:  [18] 18 (06/01 0717) BP: (107-162)/(58-70) 107/70 mmHg (06/01 0717) SpO2:  [99 %-100 %] 99 % (06/01 0717)  Intake/Output from previous day: 05/31 0701 - 06/01 0700 In: 720 [P.O.:720] Out: 1050 [Urine:1050] Intake/Output this shift:     Recent Labs  06/19/15 1043 06/20/15 0545 06/21/15 0630  HGB 11.4* 10.5* 11.2*    Recent Labs  06/20/15 0545 06/21/15 0630  WBC 16.9* 12.0*  RBC 4.00 4.29  HCT 32.2* 33.9*  PLT 395 380    Recent Labs  06/19/15 1043 06/20/15 0545  NA  --  139  K  --  3.8  CL  --  109  CO2  --  24  BUN  --  13  CREATININE 1.07* 0.97  GLUCOSE  --  120*  CALCIUM  --  9.2   No results for input(s): LABPT, INR in the last 72 hours.  EXAM General - Patient is Alert, Appropriate and Oriented Extremity - Neurovascular intact Sensation intact distally Intact pulses distally Dorsiflexion/Plantar flexion intact No cellulitis present Dressing - dressing C/D/I, no drainage and dressing changed Motor Function - intact, moving foot and toes well on exam.   Past Medical History  Diagnosis Date  . Hypertension   . Arthritis     hands  . Murmur   . Complication of anesthesia   . PONV (postoperative nausea and vomiting)   . Diabetes mellitus without complication ([place]) 123456    diet control    Assessment/Plan:   2 Days Post-Op Procedure(s) (LRB): TOTAL KNEE ARTHROPLASTY (Left) Active Problems:   Primary osteoarthritis of left knee  Estimated body mass index is 29.87 kg/(m^2)  as calculated from the following:   Height as of this encounter: 5\' 1"  (1.549 m).   Weight as of this encounter: 71.668 kg (158 lb). Advance diet Up with therapy  Plan on discharge to SNF today pending BM Follow up with [facility] [facility] in 2 weeks for staple removal  DVT Prophylaxis - Lovenox, Foot Pumps and TED hose Weight-Bearing as tolerated to left leg D/C O2 and Pulse OX and try on Room Air  T. Rachelle Hora, PA-C [place] 06/21/2015, 8:03 AM

## 2015-06-21 NOTE — Discharge Instructions (Signed)

## 2015-06-21 NOTE — Progress Notes (Signed)
Pt complains of weakness, feeling unwell this AM. No chest pain or SOB, vital signs WNL. Pt stated she "just wasn't feeling it today". Notified MD and PA. [place] for discharge today.

## 2015-06-22 DIAGNOSIS — M1990 Unspecified osteoarthritis, unspecified site: Secondary | ICD-10-CM | POA: Diagnosis not present

## 2015-06-22 DIAGNOSIS — Z96652 Presence of left artificial knee joint: Secondary | ICD-10-CM | POA: Diagnosis not present

## 2015-06-22 DIAGNOSIS — I10 Essential (primary) hypertension: Secondary | ICD-10-CM | POA: Diagnosis not present

## 2015-06-22 DIAGNOSIS — E119 Type 2 diabetes mellitus without complications: Secondary | ICD-10-CM | POA: Diagnosis not present

## 2015-07-06 DIAGNOSIS — Z713 Dietary counseling and surveillance: Secondary | ICD-10-CM | POA: Diagnosis not present

## 2015-07-06 DIAGNOSIS — M1712 Unilateral primary osteoarthritis, left knee: Secondary | ICD-10-CM | POA: Diagnosis not present

## 2015-07-06 DIAGNOSIS — I10 Essential (primary) hypertension: Secondary | ICD-10-CM | POA: Diagnosis not present

## 2015-07-06 DIAGNOSIS — E119 Type 2 diabetes mellitus without complications: Secondary | ICD-10-CM | POA: Diagnosis not present

## 2015-07-06 DIAGNOSIS — Z96652 Presence of left artificial knee joint: Secondary | ICD-10-CM | POA: Diagnosis not present

## 2015-07-16 DIAGNOSIS — M25562 Pain in left knee: Secondary | ICD-10-CM | POA: Diagnosis not present

## 2015-07-17 ENCOUNTER — Ambulatory Visit (INDEPENDENT_AMBULATORY_CARE_PROVIDER_SITE_OTHER): Payer: Medicare Other | Admitting: Family Medicine

## 2015-07-17 VITALS — BP 122/64 | HR 94 | Temp 98.2°F | Resp 10 | Wt 159.0 lb

## 2015-07-17 DIAGNOSIS — I10 Essential (primary) hypertension: Secondary | ICD-10-CM | POA: Diagnosis not present

## 2015-07-17 DIAGNOSIS — N181 Chronic kidney disease, stage 1: Secondary | ICD-10-CM

## 2015-07-17 DIAGNOSIS — E1122 Type 2 diabetes mellitus with diabetic chronic kidney disease: Secondary | ICD-10-CM

## 2015-07-17 DIAGNOSIS — Z96651 Presence of right artificial knee joint: Secondary | ICD-10-CM | POA: Diagnosis not present

## 2015-07-17 MED ORDER — LOSARTAN POTASSIUM-HCTZ 100-25 MG PO TABS: 1.0000 | ORAL_TABLET | Freq: Every day | ORAL | Status: DC

## 2015-07-17 MED ORDER — AMLODIPINE BESYLATE 10 MG PO TABS: 10.0000 mg | ORAL_TABLET | Freq: Every day | ORAL | Status: DC

## 2015-07-17 NOTE — Progress Notes (Signed)
Subjective:  HPI  Patient is here for follow up after left knee surgery. Surgery was on May 30th and went well and she has seen Dr. Rudene Christians on June 16th for follow up. After surgery she was sent to [facility] rehab and was discharged from there June 22nd. She is doing physical therapy still 3 times a week. Healing well, some swelling present but she is using icy pack to it. She is using a cane.  She was given RX for Losartan/HCTZ at the rehab facility but she is not sure why, she is still taking Tribenzor and as far as she knows there was no problems with her b/p there. She filled RX but did not take it yet.  Her last labs were done in May at [facility].  Prior to Admission medications   Medication Sig Start Date End Date Taking? Authorizing Provider  enoxaparin (LOVENOX) 40 MG/0.4ML injection Inject 0.4 mLs (40 mg total) into the skin daily. X 14 days 06/21/15   Duanne Guess, PA-C  glucose blood (BAYER CONTOUR NEXT TEST) test strip Reported on 01/31/2015 11/14/13   Historical Provider, MD  mometasone (ELOCON) 0.1 % cream Apply 1 application topically daily. Apply small amount to the external ear. Use only for 2 weeks at a time. 04/26/15   Kati Riggenbach Maceo Pro., MD  Multiple Vitamin (MULTIVITAMIN) capsule Take 1 capsule by mouth daily.    Historical Provider, MD  Olmesartan-Amlodipine-HCTZ (TRIBENZOR) 40-10-25 MG TABS Take 1 tablet by mouth daily. 06/29/14   Shenetta Schnackenberg Maceo Pro., MD  Omega-3 Fatty Acids (FISH OIL) 1200 MG CAPS Reported on 06/19/2015 10/01/07   Historical Provider, MD  oxyCODONE (OXY IR/ROXICODONE) 5 MG immediate release tablet Take 1-2 tablets (5-10 mg total) by mouth every 3 (three) hours as needed for breakthrough pain. 06/21/15   Duanne Guess, PA-C    Patient Active Problem List   Diagnosis Date Noted  . Primary osteoarthritis of left knee 06/19/2015  . Encounter for screening colonoscopy 01/31/2015  . Arthritis 01/01/2015  . Allergic rhinitis 08/15/2014  . Arthritis of  knee 08/15/2014  . Endometriosis 08/15/2014  . HLD (hyperlipidemia) 08/15/2014  . Adult hypothyroidism 08/15/2014  . Adiposity 08/15/2014  . Primary osteoarthritis of knee 07/25/2014  . Mastalgia 02/01/2014  . Diabetes mellitus, type 2 ([place]) 12/27/2007  . Avitaminosis D 12/27/2007  . Deficiency, disaccharidase intestinal 09/18/2005  . Allergic reaction 12/09/1994  . Bloodgood disease 10/19/1990  . Essential (primary) hypertension 10/19/1990    Past Medical History  Diagnosis Date  . Hypertension   . Arthritis     hands  . Murmur   . Complication of anesthesia   . PONV (postoperative nausea and vomiting)   . Diabetes mellitus without complication ([place]) 123456    diet control    Social History   Social History  . Marital Status: Widowed    Spouse Name: N/A  . Number of Children: N/A  . Years of Education: N/A   Occupational History  . Not on file.   Social History Main Topics  . Smoking status: Never Smoker   . Smokeless tobacco: Never Used  . Alcohol Use: No  . Drug Use: No  . Sexual Activity: No   Other Topics Concern  . Not on file   Social History Narrative    Allergies  Allergen Reactions  . Latex Anaphylaxis    Review of Systems  Constitutional: Negative.   Eyes: Negative.   Respiratory: Negative.   Cardiovascular: Negative.  Gastrointestinal: Negative.   Musculoskeletal: Positive for joint pain.       Knee swelling-slight  Skin: Negative.   Endo/Heme/Allergies: Negative.   Psychiatric/Behavioral: Negative.     Immunization History  Administered Date(s) Administered  . Influenza, High Dose Seasonal PF 10/23/2014  . Pneumococcal Conjugate-13 08/16/2013  . Pneumococcal Polysaccharide-23 11/20/2010  . Td 08/25/2003  . Tdap 10/20/2012  . Zoster 10/20/2012   Objective:  There were no vitals taken for this visit.  Physical Exam  Constitutional: She is oriented to person, place, and time and well-developed, well-nourished, and in no  distress.  HENT:  Head: Normocephalic and atraumatic.  Right Ear: External ear normal.  Left Ear: External ear normal.  Nose: Nose normal.  Eyes: Conjunctivae are normal. Pupils are equal, round, and reactive to light.  Neck: Normal range of motion. Neck supple.  Cardiovascular: Normal rate, regular rhythm, normal heart sounds and intact distal pulses.   No murmur heard. Pulmonary/Chest: Effort normal and breath sounds normal. No respiratory distress. She has no wheezes.  Abdominal: Soft.  Musculoskeletal: She exhibits no edema.  Using cane today  Neurological: She is alert and oriented to person, place, and time.  Skin: Skin is warm and dry.  Psychiatric: Mood, memory, affect and judgment normal.    Lab Results  Component Value Date   WBC 12.0* 06/21/2015   HGB 11.2* 06/21/2015   HCT 33.9* 06/21/2015   PLT 380 06/21/2015   GLUCOSE 120* 06/20/2015   CHOL 183 05/26/2014   TRIG 103 05/26/2014   HDL 60 05/26/2014   LDLCALC 102 05/26/2014   TSH 2.56 05/26/2014   INR 1.03 06/06/2015   HGBA1C 6.6* 06/06/2015    CMP     Component Value Date/Time   NA 139 06/20/2015 0545   NA 142 05/26/2014   K 3.8 06/20/2015 0545   CL 109 06/20/2015 0545   CO2 24 06/20/2015 0545   GLUCOSE 120* 06/20/2015 0545   BUN 13 06/20/2015 0545   CREATININE 0.97 06/20/2015 0545   CREATININE 0.9 05/26/2014   CALCIUM 9.2 06/20/2015 0545   AST 16 05/26/2014   ALT 14 05/26/2014   ALKPHOS 62 05/26/2014   GFRNONAA 57* 06/20/2015 0545   GFRAA >60 06/20/2015 0545    Assessment and Plan :  1. Essential (primary) hypertension Stable b/p, due to cost will start patient on Hyzaar and Amlodipine and d/c Tribenzor. Follow. - losartan-hydrochlorothiazide (HYZAAR) 100-25 MG tablet; Take 1 tablet by mouth daily.  Dispense: 90 tablet; Refill: 3 - amLODipine (NORVASC) 10 MG tablet; Take 1 tablet (10 mg total) by mouth daily.  Dispense: 90 tablet; Refill: 3  2. Type 2 diabetes mellitus with stage 1 chronic  kidney disease, without long-term current use of insulin (HCC) Check A1C in 2 months.  3. Status post total right knee replacement Healing well. Using a cane today.  Patient was seen and examined by Dr. Eulas Post and note was scribed by Theressa Millard, RMA.   I have done the exam and reviewed the above chart and it is accurate to the best of my knowledge.  Miguel Aschoff MD [facility] 07/17/2015 10:49 AM

## 2015-07-20 DIAGNOSIS — M25562 Pain in left knee: Secondary | ICD-10-CM | POA: Diagnosis not present

## 2015-07-23 DIAGNOSIS — Z96652 Presence of left artificial knee joint: Secondary | ICD-10-CM | POA: Diagnosis not present

## 2015-07-23 DIAGNOSIS — M25562 Pain in left knee: Secondary | ICD-10-CM | POA: Diagnosis not present

## 2015-07-25 DIAGNOSIS — M25562 Pain in left knee: Secondary | ICD-10-CM | POA: Diagnosis not present

## 2015-07-27 DIAGNOSIS — M25562 Pain in left knee: Secondary | ICD-10-CM | POA: Diagnosis not present

## 2015-08-01 DIAGNOSIS — M25562 Pain in left knee: Secondary | ICD-10-CM | POA: Diagnosis not present

## 2015-08-07 DIAGNOSIS — M25562 Pain in left knee: Secondary | ICD-10-CM | POA: Diagnosis not present

## 2015-08-08 ENCOUNTER — Other Ambulatory Visit: Payer: Self-pay | Admitting: Family Medicine

## 2015-08-10 DIAGNOSIS — M25562 Pain in left knee: Secondary | ICD-10-CM | POA: Diagnosis not present

## 2015-08-14 DIAGNOSIS — M25662 Stiffness of left knee, not elsewhere classified: Secondary | ICD-10-CM | POA: Diagnosis not present

## 2015-08-16 DIAGNOSIS — M25662 Stiffness of left knee, not elsewhere classified: Secondary | ICD-10-CM | POA: Diagnosis not present

## 2015-09-13 ENCOUNTER — Encounter: Payer: Self-pay | Admitting: Family Medicine

## 2015-09-17 ENCOUNTER — Ambulatory Visit (INDEPENDENT_AMBULATORY_CARE_PROVIDER_SITE_OTHER): Payer: Medicare Other | Admitting: Family Medicine

## 2015-09-17 VITALS — BP 158/64 | HR 82 | Temp 98.4°F | Resp 14 | Wt 158.0 lb

## 2015-09-17 DIAGNOSIS — I10 Essential (primary) hypertension: Secondary | ICD-10-CM

## 2015-09-17 DIAGNOSIS — Z23 Encounter for immunization: Secondary | ICD-10-CM | POA: Diagnosis not present

## 2015-09-17 DIAGNOSIS — E1122 Type 2 diabetes mellitus with diabetic chronic kidney disease: Secondary | ICD-10-CM

## 2015-09-17 DIAGNOSIS — E785 Hyperlipidemia, unspecified: Secondary | ICD-10-CM | POA: Diagnosis not present

## 2015-09-17 DIAGNOSIS — N181 Chronic kidney disease, stage 1: Secondary | ICD-10-CM

## 2015-09-17 DIAGNOSIS — M1990 Unspecified osteoarthritis, unspecified site: Secondary | ICD-10-CM | POA: Diagnosis not present

## 2015-09-17 NOTE — Progress Notes (Signed)
Subjective:  HPI  Patient is here for 2 months follow up: Hypertension: Do to insurance coverage on her last visit switched Tribenzor to Hyzaar and Amlodipine. B/P has been controlled per patient, this morning was 124/68. No cardiac symptoms. BP Readings from Last 3 Encounters:  09/17/15 (!) 158/64  07/17/15 122/64  06/21/15 131/70   Diabetes: patient checks her sugar in the morning and average is usually 128-135. No numbness or tingling sensation. Had eye exam earlier this year-2017. Worsening pain in both shoulders and limited range of motion left shoulder. Lab Results  Component Value Date   HGBA1C 6.6 (H) 06/06/2015    Prior to Admission medications   Medication Sig Start Date End Date Taking? Authorizing Provider  amLODipine (NORVASC) 10 MG tablet Take 1 tablet (10 mg total) by mouth daily. 07/17/15  Yes Richard Maceo Pro., MD  glucose blood (BAYER CONTOUR NEXT TEST) test strip Reported on 01/31/2015 11/14/13  Yes Historical Provider, MD  losartan-hydrochlorothiazide (HYZAAR) 100-25 MG tablet Take 1 tablet by mouth daily. 07/17/15  Yes Richard Maceo Pro., MD  Multiple Vitamin (MULTIVITAMIN) capsule Take 1 capsule by mouth daily.   Yes Historical Provider, MD  Omega-3 Fatty Acids (FISH OIL) 1200 MG CAPS Reported on 06/19/2015 10/01/07  Yes Historical Provider, MD    Patient Active Problem List   Diagnosis Date Noted  . Primary osteoarthritis of left knee 06/19/2015  . Encounter for screening colonoscopy 01/31/2015  . Arthritis 01/01/2015  . Allergic rhinitis 08/15/2014  . Arthritis of knee 08/15/2014  . Endometriosis 08/15/2014  . HLD (hyperlipidemia) 08/15/2014  . Adult hypothyroidism 08/15/2014  . Adiposity 08/15/2014  . Primary osteoarthritis of knee 07/25/2014  . Mastalgia 02/01/2014  . Diabetes mellitus, type 2 ([place]) 12/27/2007  . Avitaminosis D 12/27/2007  . Deficiency, disaccharidase intestinal 09/18/2005  . Allergic reaction 12/09/1994  . Bloodgood  disease 10/19/1990  . Essential (primary) hypertension 10/19/1990    Past Medical History:  Diagnosis Date  . Arthritis    hands  . Complication of anesthesia   . Diabetes mellitus without complication ([place]) 123456   diet control  . Hypertension   . Murmur   . PONV (postoperative nausea and vomiting)     Social History   Social History  . Marital status: Widowed    Spouse name: N/A  . Number of children: N/A  . Years of education: N/A   Occupational History  . Not on file.   Social History Main Topics  . Smoking status: Never Smoker  . Smokeless tobacco: Never Used  . Alcohol use No  . Drug use: No  . Sexual activity: No   Other Topics Concern  . Not on file   Social History Narrative  . No narrative on file    Allergies  Allergen Reactions  . Latex Anaphylaxis    Review of Systems  Constitutional: Positive for malaise/fatigue (at times).  Eyes: Negative.   Respiratory: Negative.   Cardiovascular: Negative.   Gastrointestinal: Negative.   Musculoskeletal: Positive for joint pain and myalgias.  Neurological: Negative.   Endo/Heme/Allergies: Negative.   Psychiatric/Behavioral: Negative.     Immunization History  Administered Date(s) Administered  . Influenza, High Dose Seasonal PF 10/23/2014  . Pneumococcal Conjugate-13 08/16/2013  . Pneumococcal Polysaccharide-23 11/20/2010  . Td 08/25/2003  . Tdap 10/20/2012  . Zoster 10/20/2012   Objective:  BP (!) 158/64   Pulse 82   Temp 98.4 F (36.9 C)   Resp 14   Wt 158 lb (  71.7 kg)   BMI 29.85 kg/m   Physical Exam  Constitutional: She is oriented to person, place, and time and well-developed, well-nourished, and in no distress.  HENT:  Head: Normocephalic and atraumatic.  Right Ear: External ear normal.  Left Ear: External ear normal.  Nose: Nose normal.  Eyes: Conjunctivae are normal. Pupils are equal, round, and reactive to light.  Neck: Normal range of motion. Neck supple.  Cardiovascular:  Normal rate, regular rhythm, normal heart sounds and intact distal pulses.   No murmur heard. Pulmonary/Chest: Effort normal and breath sounds normal. No respiratory distress. She has no wheezes.  Abdominal: Soft.  Musculoskeletal:  Decreased range of motion in the left shoulder.  Neurological: She is alert and oriented to person, place, and time.  Skin: Skin is warm.  Psychiatric: Mood, memory, affect and judgment normal.    Lab Results  Component Value Date   WBC 12.0 (H) 06/21/2015   HGB 11.2 (L) 06/21/2015   HCT 33.9 (L) 06/21/2015   PLT 380 06/21/2015   GLUCOSE 120 (H) 06/20/2015   CHOL 183 05/26/2014   TRIG 103 05/26/2014   HDL 60 05/26/2014   LDLCALC 102 05/26/2014   TSH 2.56 05/26/2014   INR 1.03 06/06/2015   HGBA1C 6.6 (H) 06/06/2015    CMP     Component Value Date/Time   NA 139 06/20/2015 0545   NA 142 05/26/2014   K 3.8 06/20/2015 0545   CL 109 06/20/2015 0545   CO2 24 06/20/2015 0545   GLUCOSE 120 (H) 06/20/2015 0545   BUN 13 06/20/2015 0545   CREATININE 0.97 06/20/2015 0545   CALCIUM 9.2 06/20/2015 0545   AST 16 05/26/2014   ALT 14 05/26/2014   ALKPHOS 62 05/26/2014   GFRNONAA 57 (L) 06/20/2015 0545   GFRAA >60 06/20/2015 0545    Assessment and Plan :  1. Essential (primary) hypertension Stable. Continue current medication.  2. Type 2 diabetes mellitus with stage 1 chronic kidney disease, without long-term current use of insulin (HCC) A1C 6.8. Stable.   3. Arthritis/Shoulder arthropathy, left greater than right Worsening in the left shoulder. Discussed this with patient and she will get in touch with Dr Rudene Christians to follow up on the issue,  Administered Flu shot today. Get all lab work on the next visit. I have done the exam and reviewed the above chart and it is accurate to the best of my knowledge.  Patient was seen and examined by Dr. Eulas Post and note was scribed by Theressa Millard, RMA.    Miguel Aschoff MD [facility] 09/17/2015 10:01 AM

## 2015-11-08 ENCOUNTER — Telehealth: Payer: Self-pay | Admitting: Family Medicine

## 2015-11-08 NOTE — Telephone Encounter (Signed)
LM for pt to sched AWV in Nov or when convenient, j. strack

## 2015-11-14 ENCOUNTER — Telehealth: Payer: Self-pay | Admitting: Family Medicine

## 2015-11-14 NOTE — Telephone Encounter (Signed)
FYI. KW 

## 2015-11-14 NOTE — Telephone Encounter (Signed)
Pt confirms that she wants to order diabetic supplies from [employer].  They will fax an order for the supplies that she needs.  Thanks Con Memos

## 2015-11-15 NOTE — Telephone Encounter (Signed)
ok 

## 2015-12-05 ENCOUNTER — Ambulatory Visit (INDEPENDENT_AMBULATORY_CARE_PROVIDER_SITE_OTHER): Payer: Medicare Other | Admitting: Family Medicine

## 2015-12-05 VITALS — BP 156/78 | HR 100 | Temp 98.5°F | Ht 61.0 in | Wt 153.2 lb

## 2015-12-05 DIAGNOSIS — Z0000 Encounter for general adult medical examination without abnormal findings: Secondary | ICD-10-CM

## 2015-12-05 NOTE — Patient Instructions (Signed)
Cynthia Castillo , Thank you for taking time to come for your Medicare Wellness Visit. I appreciate your ongoing commitment to your health goals. Please review the following plan we discussed and let me know if I can assist you in the future.   These are the goals we discussed: Goals    . Increase water intake          Starting 12/05/15, I will increase my water intake to 4 glasses a day.       This is a list of the screening recommended for you and due dates:  Health Maintenance  Topic Date Due  . Complete foot exam   12/25/2015*  . DEXA scan (bone density measurement)  12/25/2015*  . Eye exam for diabetics  01/20/2016*  . Hemoglobin A1C  12/07/2015  . Mammogram  05/10/2017  . Tetanus Vaccine  10/21/2022  . Colon Cancer Screening  03/20/2025  . Flu Shot  Completed  . Shingles Vaccine  Completed  . Pneumonia vaccines  Completed  *Topic was postponed. The date shown is not the original due date.   Preventive Care for Adults  A healthy lifestyle and preventive care can promote health and wellness. Preventive health guidelines for adults include the following key practices.  . A routine yearly physical is a good way to check with your health care provider about your health and preventive screening. It is a chance to share any concerns and updates on your health and to receive a thorough exam.  . Visit your dentist for a routine exam and preventive care every 6 months. Brush your teeth twice a day and floss once a day. Good oral hygiene prevents tooth decay and gum disease.  . The frequency of eye exams is based on your age, health, family medical history, use  of contact lenses, and other factors. Follow your health care provider's ecommendations for frequency of eye exams.  . Eat a healthy diet. Foods like vegetables, fruits, whole grains, low-fat dairy products, and lean protein foods contain the nutrients you need without too many calories. Decrease your intake of foods high in  solid fats, added sugars, and salt. Eat the right amount of calories for you. Get information about a proper diet from your health care provider, if necessary.  . Regular physical exercise is one of the most important things you can do for your health. Most adults should get at least 150 minutes of moderate-intensity exercise (any activity that increases your heart rate and causes you to sweat) each week. In addition, most adults need muscle-strengthening exercises on 2 or more days a week.  Silver Sneakers may be a benefit available to you. To determine eligibility, you may visit the website: www.silversneakers.com or contact program at [phone] Mon-Fri between 8AM-8PM.   . Maintain a healthy weight. The body mass index (BMI) is a screening tool to identify possible weight problems. It provides an estimate of body fat based on height and weight. Your health care provider can find your BMI and can help you achieve or maintain a healthy weight.   For adults 20 years and older: ? A BMI below 18.5 is considered underweight. ? A BMI of 18.5 to 24.9 is normal. ? A BMI of 25 to 29.9 is considered overweight. ? A BMI of 30 and above is considered obese.   . Maintain normal blood lipids and cholesterol levels by exercising and minimizing your intake of saturated fat. Eat a balanced diet with plenty of fruit and vegetables. Blood  tests for lipids and cholesterol should begin at age 72 and be repeated every 5 years. If your lipid or cholesterol levels are high, you are over 50, or you are at high risk for heart disease, you may need your cholesterol levels checked more frequently. Ongoing high lipid and cholesterol levels should be treated with medicines if diet and exercise are not working.  . If you smoke, find out from your health care provider how to quit. If you do not use tobacco, please do not start.  . If you choose to drink alcohol, please do not consume more than 2 drinks per day. One drink  is considered to be 12 ounces (355 mL) of beer, 5 ounces (148 mL) of wine, or 1.5 ounces (44 mL) of liquor.  . If you are 46-32 years old, ask your health care provider if you should take aspirin to prevent strokes.  . Use sunscreen. Apply sunscreen liberally and repeatedly throughout the day. You should seek shade when your shadow is shorter than you. Protect yourself by wearing long sleeves, pants, a wide-brimmed hat, and sunglasses year round, whenever you are outdoors.  . Once a month, do a whole body skin exam, using a mirror to look at the skin on your back. Tell your health care provider of new moles, moles that have irregular borders, moles that are larger than a pencil eraser, or moles that have changed in shape or color.

## 2015-12-05 NOTE — Progress Notes (Signed)
Subjective:   Cynthia Castillo is a 75 y.o. female who presents for Medicare Annual (Subsequent) preventive examination.  Review of Systems:  N/A  Cardiac Risk Factors include: advanced age (>97men, >53 women);diabetes mellitus;dyslipidemia;hypertension     Objective:     Vitals: BP (!) 156/78 (BP Location: Right Arm)   Pulse 100   Temp 98.5 F (36.9 C) (Oral)   Ht 5\' 1"  (1.549 m)   Wt 153 lb 4 oz (69.5 kg)   BMI 28.96 kg/m   Body mass index is 28.96 kg/m.   Tobacco History  Smoking Status  . Never Smoker  Smokeless Tobacco  . Never Used     Counseling given: Not Answered   Past Medical History:  Diagnosis Date  . Arthritis    hands  . Complication of anesthesia   . Diabetes mellitus without complication ([place]) 123456   diet control  . Hypertension   . Murmur   . PONV (postoperative nausea and vomiting)    Past Surgical History:  Procedure Laterality Date  . ABDOMINAL HYSTERECTOMY  1972  . APPENDECTOMY    . BREAST CYST EXCISION Left 1972  . COLONOSCOPY  2007   Dr Bary Castilla  . COLONOSCOPY WITH PROPOFOL N/A 03/21/2015   Procedure: COLONOSCOPY WITH PROPOFOL;  Surgeon: Robert Bellow, MD;  Location[facility] ENDOSCOPY;  Service: Endoscopy;  Laterality: N/A;  . TOTAL KNEE ARTHROPLASTY Right 07/25/2014   Procedure: TOTAL KNEE ARTHROPLASTY;  Surgeon: Hessie Knows, MD;  Location: [facility] ORS;  Service: Orthopedics;  Laterality: Right;  . TOTAL KNEE ARTHROPLASTY Left 06/19/2015   Procedure: TOTAL KNEE ARTHROPLASTY;  Surgeon: Hessie Knows, MD;  Location: [facility] ORS;  Service: Orthopedics;  Laterality: Left;   Family History  Problem Relation Age of Onset  . Stroke Mother   . Heart attack Father   . Cancer Sister 57    stomach/[place]  . Cancer Sister 103    breast/Rosa Graves  . Cancer Maternal Grandmother     unknown  . Breast cancer Neg Hx    History  Sexual Activity  . Sexual activity: No    Outpatient Encounter Prescriptions as of 12/05/2015  Medication Sig  .  amLODipine (NORVASC) 10 MG tablet Take 1 tablet (10 mg total) by mouth daily.  Marland Kitchen glucose blood (BAYER CONTOUR NEXT TEST) test strip Reported on 01/31/2015  . Multiple Vitamin (MULTIVITAMIN) capsule Take 1 capsule by mouth daily.  . Omega-3 Fatty Acids (FISH OIL) 1200 MG CAPS Reported on 06/19/2015  . losartan-hydrochlorothiazide (HYZAAR) 100-25 MG tablet Take 1 tablet by mouth daily. (Patient not taking: Reported on 12/05/2015)   No facility-administered encounter medications on file as of 12/05/2015.     Activities of Daily Living In your present state of health, do you have any difficulty performing the following activities: 12/05/2015 06/19/2015  Hearing? N N  Vision? N N  Difficulty concentrating or making decisions? Y N  Walking or climbing stairs? N N  Dressing or bathing? N N  Doing errands, shopping? N N  Preparing Food and eating ? N -  Using the Toilet? N -  In the past six months, have you accidently leaked urine? N -  Do you have problems with loss of bowel control? N -  Managing your Medications? N -  Managing your Finances? N -  Housekeeping or managing your Housekeeping? N -  Some recent data might be hidden    Patient Care Team: Jerrol Banana., MD as PCP - General (Family Medicine) Dellis Filbert  Amedeo Kinsman, MD (General Surgery) Hessie Knows, MD as Consulting Physician (Orthopedic Surgery) Vernon Prey, MD as Referring Physician (Ophthalmology)    Assessment:     Exercise Activities and Dietary recommendations Current Exercise Habits: Home exercise routine, Type of exercise: stretching, Time (Minutes): 15, Frequency (Times/Week): 3, Weekly Exercise (Minutes/Week): 45, Intensity: Mild  Goals    . Increase water intake          Starting 12/05/15, I will increase my water intake to 4 glasses a day.      Fall Risk Fall Risk  12/05/2015 10/23/2014  Falls in the past year? No No   Depression Screen PHQ 2/9 Scores 12/05/2015 10/23/2014  PHQ - 2 Score 0 0      Cognitive Function     6CIT Screen 12/05/2015  What Year? 0 points  What month? 0 points  What time? 0 points  Count back from 20 0 points  Months in reverse 2 points  Repeat phrase 4 points  Total Score 6    Immunization History  Administered Date(s) Administered  . Influenza, High Dose Seasonal PF 10/23/2014, 09/17/2015  . Pneumococcal Conjugate-13 08/16/2013  . Pneumococcal Polysaccharide-23 11/20/2010  . Td 08/25/2003  . Tdap 10/20/2012  . Zoster 10/20/2012   Screening Tests Health Maintenance  Topic Date Due  . FOOT EXAM  12/25/2015 (Originally 11/25/1950)  . DEXA SCAN  12/25/2015 (Originally 11/25/2006)  . OPHTHALMOLOGY EXAM  01/20/2016 (Originally 10/12/2015)  . HEMOGLOBIN A1C  12/07/2015  . MAMMOGRAM  05/10/2017  . TETANUS/TDAP  10/21/2022  . COLONOSCOPY  03/20/2025  . INFLUENZA VACCINE  Completed  . ZOSTAVAX  Completed  . PNA vac Low Risk Adult  Completed      Plan:  I have personally reviewed and addressed the Medicare Annual Wellness questionnaire and have noted the following in the patient's chart:  A. Medical and social history B. Use of alcohol, tobacco or illicit drugs  C. Current medications and supplements D. Functional ability and status E.  Nutritional status F.  Physical activity G. Advance directives H. List of other physicians I.  Hospitalizations, surgeries, and ER visits in previous 12 months J.  [place] such as hearing and vision if needed, cognitive and depression L. Referrals and appointments - none  In addition, I have reviewed and discussed with patient certain preventive protocols, quality metrics, and best practice recommendations. A written personalized care plan for preventive services as well as general preventive health recommendations were provided to patient.  See attached scanned questionnaire for additional information.   Signed,  Fabio Neighbors, LPN Nurse Health Advisor    MD to follow up on DEXA  scan and diabetic foot exam. Pt will try to schedule eye exam by the end of 2017. I have reviewed the information as  put it by [facility] LPN and was available for any questions or concerns. Miguel Aschoff MD [facility]

## 2015-12-07 DIAGNOSIS — S335XXA Sprain of ligaments of lumbar spine, initial encounter: Secondary | ICD-10-CM | POA: Diagnosis not present

## 2015-12-12 DIAGNOSIS — J00 Acute nasopharyngitis [common cold]: Secondary | ICD-10-CM | POA: Diagnosis not present

## 2015-12-12 DIAGNOSIS — R000 Tachycardia, unspecified: Secondary | ICD-10-CM | POA: Diagnosis not present

## 2015-12-18 ENCOUNTER — Ambulatory Visit (INDEPENDENT_AMBULATORY_CARE_PROVIDER_SITE_OTHER): Payer: Medicare Other | Admitting: Family Medicine

## 2015-12-18 VITALS — BP 152/70 | HR 94 | Temp 98.5°F | Resp 18 | Wt 156.0 lb

## 2015-12-18 DIAGNOSIS — R05 Cough: Secondary | ICD-10-CM

## 2015-12-18 DIAGNOSIS — J40 Bronchitis, not specified as acute or chronic: Secondary | ICD-10-CM | POA: Diagnosis not present

## 2015-12-18 DIAGNOSIS — R059 Cough, unspecified: Secondary | ICD-10-CM

## 2015-12-18 NOTE — Progress Notes (Signed)
Cynthia Castillo  MRN: [id number] DOB: Apr 20, 1940  Subjective:  HPI   The patient is a 75 year old female who presents for evaluation of 10 day cough.  She was seen at the walk in clinic on Friday and was told she had a cold and it was getting better.  She has been using Mucinex.    Patient Active Problem List   Diagnosis Date Noted  . Primary osteoarthritis of left knee 06/19/2015  . Encounter for screening colonoscopy 01/31/2015  . Arthritis 01/01/2015  . Allergic rhinitis 08/15/2014  . Arthritis of knee 08/15/2014  . Endometriosis 08/15/2014  . HLD (hyperlipidemia) 08/15/2014  . Adult hypothyroidism 08/15/2014  . Adiposity 08/15/2014  . Primary osteoarthritis of knee 07/25/2014  . Mastalgia 02/01/2014  . Diabetes mellitus, type 2 ([place]) 12/27/2007  . Avitaminosis D 12/27/2007  . Deficiency, disaccharidase intestinal 09/18/2005  . Allergic reaction 12/09/1994  . Bloodgood disease 10/19/1990  . Essential (primary) hypertension 10/19/1990    Past Medical History:  Diagnosis Date  . Arthritis    hands  . Complication of anesthesia   . Diabetes mellitus without complication ([place]) 123456   diet control  . Hypertension   . Murmur   . PONV (postoperative nausea and vomiting)     Social History   Social History  . Marital status: Widowed    Spouse name: N/A  . Number of children: N/A  . Years of education: N/A   Occupational History  . Not on file.   Social History Main Topics  . Smoking status: Never Smoker  . Smokeless tobacco: Never Used  . Alcohol use No  . Drug use: No  . Sexual activity: No   Other Topics Concern  . Not on file   Social History Narrative  . No narrative on file    Outpatient Encounter Prescriptions as of 12/18/2015  Medication Sig Note  . amLODipine (NORVASC) 10 MG tablet Take 1 tablet (10 mg total) by mouth daily.   Marland Kitchen glucose blood (BAYER CONTOUR NEXT TEST) test strip Reported on 01/31/2015 08/15/2014: diagnosis code: E11.40 Received  from: [facility]  . losartan-hydrochlorothiazide (HYZAAR) 100-25 MG tablet Take 1 tablet by mouth daily.   . Multiple Vitamin (MULTIVITAMIN) capsule Take 1 capsule by mouth daily.   . Omega-3 Fatty Acids (FISH OIL) 1200 MG CAPS Reported on 06/19/2015 08/15/2014: DX: 272.0 Received from: [facility]   No facility-administered encounter medications on file as of 12/18/2015.     Allergies  Allergen Reactions  . Latex Anaphylaxis    Review of Systems  Constitutional: Negative for fever and malaise/fatigue.  HENT: Negative for congestion, ear discharge, ear pain, hearing loss, nosebleeds, sinus pain, sore throat and tinnitus.   Eyes: Negative for blurred vision, double vision, photophobia, pain, discharge and redness.  Respiratory: Positive for cough and sputum production. Negative for hemoptysis, shortness of breath and wheezing.   Cardiovascular: Negative for chest pain, palpitations, orthopnea, claudication, leg swelling and PND.  Neurological: Negative for dizziness, weakness and headaches.  Endo/Heme/Allergies: Negative.   Psychiatric/Behavioral: Negative.     Objective:  BP (!) 152/70 (BP Location: Right Arm, Patient Position: Sitting, Cuff Size: Normal)   Pulse 94   Temp 98.5 F (36.9 C) (Oral)   Resp 18   Wt 156 lb (70.8 kg)   SpO2 100%   BMI 29.48 kg/m   Physical Exam  Constitutional: She is oriented to person, place, and time and well-developed, well-nourished, and in no distress.  HENT:  Head: Normocephalic and atraumatic.  Right Ear: External ear normal.  Left Ear: External ear normal.  Mouth/Throat: Oropharynx is clear and moist.  Eyes: Conjunctivae are normal. Pupils are equal, round, and reactive to light.  Neck: Normal range of motion. Neck supple.  Cardiovascular: Normal rate, regular rhythm and normal heart sounds.   Pulmonary/Chest: Effort normal.  Abdominal: Soft.  Neurological: She is alert and oriented to person, place,  and time. GCS score is 15.  Skin: Skin is warm and dry.  Psychiatric: Mood, memory, affect and judgment normal.    Assessment and Plan :   URI/Bronchitis Fluids/rest/[place] DM. Doxycycline/CXR  if she worsens. TIIDM   HPI, Exam and A&P Transcribed under the direction and in the presence of Miguel Aschoff, Brooke Bonito., MD. Electronically Signed: Althea Charon, RMA I have done the exam and reviewed the chart and it is accurate to the best of my knowledge. Development worker, community has been used and  any errors in dictation or transcription are unintentional. Miguel Aschoff M.D. [facility]

## 2015-12-21 DIAGNOSIS — M1712 Unilateral primary osteoarthritis, left knee: Secondary | ICD-10-CM | POA: Diagnosis not present

## 2015-12-21 DIAGNOSIS — M25571 Pain in right ankle and joints of right foot: Secondary | ICD-10-CM | POA: Diagnosis not present

## 2015-12-21 DIAGNOSIS — M25572 Pain in left ankle and joints of left foot: Secondary | ICD-10-CM | POA: Diagnosis not present

## 2015-12-23 DIAGNOSIS — M12519 Traumatic arthropathy, unspecified shoulder: Secondary | ICD-10-CM | POA: Diagnosis not present

## 2015-12-25 ENCOUNTER — Encounter: Payer: Self-pay | Admitting: Family Medicine

## 2015-12-25 ENCOUNTER — Ambulatory Visit (INDEPENDENT_AMBULATORY_CARE_PROVIDER_SITE_OTHER): Payer: Medicare Other | Admitting: Family Medicine

## 2015-12-25 VITALS — BP 136/68 | HR 98 | Temp 97.8°F | Resp 18 | Wt 152.0 lb

## 2015-12-25 DIAGNOSIS — E785 Hyperlipidemia, unspecified: Secondary | ICD-10-CM | POA: Diagnosis not present

## 2015-12-25 DIAGNOSIS — N181 Chronic kidney disease, stage 1: Secondary | ICD-10-CM | POA: Diagnosis not present

## 2015-12-25 DIAGNOSIS — I10 Essential (primary) hypertension: Secondary | ICD-10-CM | POA: Diagnosis not present

## 2015-12-25 DIAGNOSIS — E1122 Type 2 diabetes mellitus with diabetic chronic kidney disease: Secondary | ICD-10-CM | POA: Diagnosis not present

## 2015-12-25 NOTE — Progress Notes (Signed)
Subjective:  HPI  Diabetes Mellitus Type II, Follow-up:   Lab Results  Component Value Date   HGBA1C 6.6 (H) 06/06/2015   HGBA1C 6.7 04/26/2015   HGBA1C 6.7 10/23/2014    Last seen for diabetes 4 months ago.  Management since then includes none. She reports good compliance with treatment. She is not having side effects.  Home blood sugar records: 120's-130's. This morning BS was 143  Current exercise: no regular exercise  Pertinent Labs:    Component Value Date/Time   CHOL 183 05/26/2014   TRIG 103 05/26/2014   HDL 60 05/26/2014   LDLCALC 102 05/26/2014   CREATININE 0.97 06/20/2015 0545    Wt Readings from Last 3 Encounters:  12/25/15 152 lb (68.9 kg)  12/18/15 156 lb (70.8 kg)  12/05/15 153 lb 4 oz (69.5 kg)    ------------------------------------------------------------------------   Hypertension, follow-up:  BP Readings from Last 3 Encounters:  12/25/15 136/68  12/18/15 (!) 152/70  12/05/15 (!) 156/78    She was last seen for hypertension 4 months ago.  BP at that visit was 152/70. Management since that visit includes none. She reports good compliance with treatment. Outside blood pressures are 130's/70's. She is experiencing none.  Patient denies chest pain, chest pressure/discomfort, claudication, dyspnea, exertional chest pressure/discomfort, fatigue, irregular heart beat, lower extremity edema, near-syncope, orthopnea, palpitations, paroxysmal nocturnal dyspnea and syncope.   Wt Readings from Last 3 Encounters:  12/25/15 152 lb (68.9 kg)  12/18/15 156 lb (70.8 kg)  12/05/15 153 lb 4 oz (69.5 kg)   ------------------------------------------------------------------------ Pt is getting over a URI, but is feeling much better. She is also due for other routine labs. Therefore did not do A1c in office today.    Prior to Admission medications   Medication Sig Start Date End Date Taking? Authorizing Provider  amLODipine (NORVASC) 10 MG tablet Take  1 tablet (10 mg total) by mouth daily. 07/17/15   Daylon Lafavor Maceo Pro., MD  glucose blood (BAYER CONTOUR NEXT TEST) test strip Reported on 01/31/2015 11/14/13   Historical Provider, MD  losartan-hydrochlorothiazide (HYZAAR) 100-25 MG tablet Take 1 tablet by mouth daily. 07/17/15   Winola Drum Maceo Pro., MD  Multiple Vitamin (MULTIVITAMIN) capsule Take 1 capsule by mouth daily.    Historical Provider, MD  Omega-3 Fatty Acids (FISH OIL) 1200 MG CAPS Reported on 06/19/2015 10/01/07   Historical Provider, MD    Patient Active Problem List   Diagnosis Date Noted  . Primary osteoarthritis of left knee 06/19/2015  . Encounter for screening colonoscopy 01/31/2015  . Arthritis 01/01/2015  . Allergic rhinitis 08/15/2014  . Arthritis of knee 08/15/2014  . Endometriosis 08/15/2014  . HLD (hyperlipidemia) 08/15/2014  . Adult hypothyroidism 08/15/2014  . Adiposity 08/15/2014  . Primary osteoarthritis of knee 07/25/2014  . Mastalgia 02/01/2014  . Diabetes mellitus, type 2 ([place]) 12/27/2007  . Avitaminosis D 12/27/2007  . Deficiency, disaccharidase intestinal 09/18/2005  . Allergic reaction 12/09/1994  . Bloodgood disease 10/19/1990  . Essential (primary) hypertension 10/19/1990    Past Medical History:  Diagnosis Date  . Arthritis    hands  . Complication of anesthesia   . Diabetes mellitus without complication ([place]) 123456   diet control  . Hypertension   . Murmur   . PONV (postoperative nausea and vomiting)     Social History   Social History  . Marital status: Widowed    Spouse name: N/A  . Number of children: N/A  . Years of education: N/A  Occupational History  . Not on file.   Social History Main Topics  . Smoking status: Never Smoker  . Smokeless tobacco: Never Used  . Alcohol use No  . Drug use: No  . Sexual activity: No   Other Topics Concern  . Not on file   Social History Narrative  . No narrative on file    Allergies  Allergen Reactions  . Latex Anaphylaxis      Review of Systems  Constitutional: Negative.   HENT: Negative.   Eyes: Negative.   Respiratory: Negative.   Cardiovascular: Negative.   Gastrointestinal: Negative.   Genitourinary: Negative.   Musculoskeletal: Positive for joint pain.  Skin: Negative.   Neurological: Negative.   Endo/Heme/Allergies: Negative.   Psychiatric/Behavioral: Negative.     Immunization History  Administered Date(s) Administered  . Influenza, High Dose Seasonal PF 10/23/2014, 09/17/2015  . Pneumococcal Conjugate-13 08/16/2013  . Pneumococcal Polysaccharide-23 11/20/2010  . Td 08/25/2003  . Tdap 10/20/2012  . Zoster 10/20/2012    Objective:  BP 136/68 (BP Location: Left Arm, Patient Position: Sitting, Cuff Size: Normal)   Pulse 98   Temp 97.8 F (36.6 C) (Oral)   Resp 18   Wt 152 lb (68.9 kg)   SpO2 97%   BMI 28.72 kg/m   Physical Exam  Constitutional: She is oriented to person, place, and time and well-developed, well-nourished, and in no distress.  HENT:  Head: Normocephalic and atraumatic.  Right Ear: External ear normal.  Left Ear: External ear normal.  Nose: Nose normal.  Eyes: Conjunctivae are normal.  Neck: No thyromegaly present.  Cardiovascular: Normal rate, regular rhythm and normal heart sounds.   Pulmonary/Chest: Effort normal and breath sounds normal.  Abdominal: Soft.  Lymphadenopathy:    She has no cervical adenopathy.  Neurological: She is alert and oriented to person, place, and time.  Skin: Skin is warm and dry.  Psychiatric: Mood, memory, affect and judgment normal.    Lab Results  Component Value Date   WBC 12.0 (H) 06/21/2015   HGB 11.2 (L) 06/21/2015   HCT 33.9 (L) 06/21/2015   PLT 380 06/21/2015   GLUCOSE 120 (H) 06/20/2015   CHOL 183 05/26/2014   TRIG 103 05/26/2014   HDL 60 05/26/2014   LDLCALC 102 05/26/2014   TSH 2.56 05/26/2014   INR 1.03 06/06/2015   HGBA1C 6.6 (H) 06/06/2015   MICROALBUR 20 10/23/2014    CMP     Component Value  Date/Time   NA 139 06/20/2015 0545   NA 142 05/26/2014   K 3.8 06/20/2015 0545   CL 109 06/20/2015 0545   CO2 24 06/20/2015 0545   GLUCOSE 120 (H) 06/20/2015 0545   BUN 13 06/20/2015 0545   CREATININE 0.97 06/20/2015 0545   CALCIUM 9.2 06/20/2015 0545   AST 16 05/26/2014   ALT 14 05/26/2014   ALKPHOS 62 05/26/2014   GFRNONAA 57 (L) 06/20/2015 0545   GFRAA >60 06/20/2015 0545    Assessment and Plan :  1. Essential (primary) hypertension - CBC with Differential/Platelet - TSH  2. Type 2 diabetes mellitus with stage 1 chronic kidney disease, without long-term current use of insulin (HCC)  - Hemoglobin A1c  3. Hyperlipidemia, unspecified hyperlipidemia type  - Lipid Panel With LDL/HDL Ratio - Comprehensive metabolic panel. 4.URI Resolving.  I have done the exam and reviewed the above chart and it is accurate to the best of my knowledge. Development worker, community has been used in this note in any air is in  the dictation or transcription are unintentional.  Miguel Aschoff MD [facility] 12/25/2015 11:33 AM

## 2016-01-07 DIAGNOSIS — N181 Chronic kidney disease, stage 1: Secondary | ICD-10-CM | POA: Diagnosis not present

## 2016-01-07 DIAGNOSIS — M12519 Traumatic arthropathy, unspecified shoulder: Secondary | ICD-10-CM | POA: Diagnosis not present

## 2016-01-07 DIAGNOSIS — I10 Essential (primary) hypertension: Secondary | ICD-10-CM | POA: Diagnosis not present

## 2016-01-07 DIAGNOSIS — E785 Hyperlipidemia, unspecified: Secondary | ICD-10-CM | POA: Diagnosis not present

## 2016-01-07 DIAGNOSIS — E1122 Type 2 diabetes mellitus with diabetic chronic kidney disease: Secondary | ICD-10-CM | POA: Diagnosis not present

## 2016-01-08 ENCOUNTER — Telehealth: Payer: Self-pay

## 2016-01-08 LAB — CBC WITH DIFFERENTIAL/PLATELET
BASOS ABS: 0 10*3/uL (ref 0.0–0.2)
Basos: 1 %
EOS (ABSOLUTE): 0.1 10*3/uL (ref 0.0–0.4)
Eos: 2 %
HEMATOCRIT: 37.6 % (ref 34.0–46.6)
Hemoglobin: 12.1 g/dL (ref 11.1–15.9)
IMMATURE GRANULOCYTES: 0 %
Immature Grans (Abs): 0 10*3/uL (ref 0.0–0.1)
LYMPHS ABS: 1.5 10*3/uL (ref 0.7–3.1)
Lymphs: 23 %
MCH: 24.9 pg — ABNORMAL LOW (ref 26.6–33.0)
MCHC: 32.2 g/dL (ref 31.5–35.7)
MCV: 78 fL — ABNORMAL LOW (ref 79–97)
MONOS ABS: 0.5 10*3/uL (ref 0.1–0.9)
Monocytes: 8 %
NEUTROS PCT: 66 %
Neutrophils Absolute: 4.3 10*3/uL (ref 1.4–7.0)
PLATELETS: 486 10*3/uL — AB (ref 150–379)
RBC: 4.85 x10E6/uL (ref 3.77–5.28)
RDW: 14.9 % (ref 12.3–15.4)
WBC: 6.5 10*3/uL (ref 3.4–10.8)

## 2016-01-08 LAB — COMPREHENSIVE METABOLIC PANEL
ALT: 8 IU/L (ref 0–32)
AST: 9 IU/L (ref 0–40)
Albumin/Globulin Ratio: 1.2 (ref 1.2–2.2)
Albumin: 4.1 g/dL (ref 3.5–4.8)
Alkaline Phosphatase: 86 IU/L (ref 39–117)
BILIRUBIN TOTAL: 0.4 mg/dL (ref 0.0–1.2)
BUN/Creatinine Ratio: 16 (ref 12–28)
BUN: 15 mg/dL (ref 8–27)
CHLORIDE: 100 mmol/L (ref 96–106)
CO2: 26 mmol/L (ref 18–29)
CREATININE: 0.96 mg/dL (ref 0.57–1.00)
Calcium: 10.4 mg/dL — ABNORMAL HIGH (ref 8.7–10.3)
GFR calc non Af Amer: 58 mL/min/{1.73_m2} — ABNORMAL LOW (ref >59)
GFR, EST AFRICAN AMERICAN: 67 mL/min/{1.73_m2} (ref >59)
GLUCOSE: 146 mg/dL — AB (ref 65–99)
Globulin, Total: 3.3 g/dL (ref 1.5–4.5)
Potassium: 3.8 mmol/L (ref 3.5–5.2)
Sodium: 142 mmol/L (ref 134–144)
TOTAL PROTEIN: 7.4 g/dL (ref 6.0–8.5)

## 2016-01-08 LAB — LIPID PANEL WITH LDL/HDL RATIO
Cholesterol, Total: 206 mg/dL — ABNORMAL HIGH (ref 100–199)
HDL: 64 mg/dL (ref >39)
LDL Calculated: 124 mg/dL — ABNORMAL HIGH (ref 0–99)
LDl/HDL Ratio: 1.9 ratio units (ref 0.0–3.2)
Triglycerides: 91 mg/dL (ref 0–149)
VLDL CHOLESTEROL CAL: 18 mg/dL (ref 5–40)

## 2016-01-08 LAB — TSH: TSH: 3.48 u[IU]/mL (ref 0.450–4.500)

## 2016-01-08 LAB — HEMOGLOBIN A1C
ESTIMATED AVERAGE GLUCOSE: 146 mg/dL
HEMOGLOBIN A1C: 6.7 % — AB (ref 4.8–5.6)

## 2016-01-08 NOTE — Telephone Encounter (Signed)
LMTCB

## 2016-01-08 NOTE — Telephone Encounter (Signed)
-----  Message from Jerrol Banana., MD sent at 01/08/2016  1:37 PM EST ----- Stop any OTC calcium she is taking. Start vitamin D OTC daily. On next visit obtain CBC and iron level and repeat met C for mild hypercalcemia. Also obtain an ionized calcium and a PTH

## 2016-01-09 NOTE — Telephone Encounter (Signed)
Pt advised, pt not taking any calcium supplements-aa

## 2016-01-10 DIAGNOSIS — E119 Type 2 diabetes mellitus without complications: Secondary | ICD-10-CM | POA: Diagnosis not present

## 2016-01-11 DIAGNOSIS — M12519 Traumatic arthropathy, unspecified shoulder: Secondary | ICD-10-CM | POA: Diagnosis not present

## 2016-01-28 ENCOUNTER — Ambulatory Visit (INDEPENDENT_AMBULATORY_CARE_PROVIDER_SITE_OTHER): Payer: Medicare Other | Admitting: Family Medicine

## 2016-01-29 ENCOUNTER — Telehealth: Payer: Self-pay

## 2016-01-29 LAB — CBC WITH DIFFERENTIAL/PLATELET
BASOS ABS: 0 10*3/uL (ref 0.0–0.2)
Basos: 1 %
EOS (ABSOLUTE): 0.1 10*3/uL (ref 0.0–0.4)
Eos: 2 %
HEMOGLOBIN: 11.6 g/dL (ref 11.1–15.9)
Hematocrit: 35.6 % (ref 34.0–46.6)
IMMATURE GRANS (ABS): 0 10*3/uL (ref 0.0–0.1)
IMMATURE GRANULOCYTES: 0 %
LYMPHS: 18 %
Lymphocytes Absolute: 1.3 10*3/uL (ref 0.7–3.1)
MCH: 25 pg — ABNORMAL LOW (ref 26.6–33.0)
MCHC: 32.6 g/dL (ref 31.5–35.7)
MCV: 77 fL — ABNORMAL LOW (ref 79–97)
Monocytes Absolute: 0.6 10*3/uL (ref 0.1–0.9)
Monocytes: 8 %
NEUTROS PCT: 71 %
Neutrophils Absolute: 5 10*3/uL (ref 1.4–7.0)
PLATELETS: 447 10*3/uL — AB (ref 150–379)
RBC: 4.64 x10E6/uL (ref 3.77–5.28)
RDW: 15.4 % (ref 12.3–15.4)
WBC: 7 10*3/uL (ref 3.4–10.8)

## 2016-01-29 LAB — COMPREHENSIVE METABOLIC PANEL
ALBUMIN: 3.9 g/dL (ref 3.5–4.8)
ALT: 9 IU/L (ref 0–32)
AST: 10 IU/L (ref 0–40)
Albumin/Globulin Ratio: 1.1 — ABNORMAL LOW (ref 1.2–2.2)
Alkaline Phosphatase: 85 IU/L (ref 39–117)
BILIRUBIN TOTAL: 0.3 mg/dL (ref 0.0–1.2)
BUN/Creatinine Ratio: 13 (ref 12–28)
BUN: 10 mg/dL (ref 8–27)
CALCIUM: 9.8 mg/dL (ref 8.7–10.3)
CHLORIDE: 101 mmol/L (ref 96–106)
CO2: 21 mmol/L (ref 18–29)
CREATININE: 0.78 mg/dL (ref 0.57–1.00)
GFR calc non Af Amer: 75 mL/min/{1.73_m2} (ref >59)
GFR, EST AFRICAN AMERICAN: 87 mL/min/{1.73_m2} (ref >59)
GLUCOSE: 142 mg/dL — AB (ref 65–99)
Globulin, Total: 3.6 g/dL (ref 1.5–4.5)
Potassium: 3.7 mmol/L (ref 3.5–5.2)
Sodium: 140 mmol/L (ref 134–144)
TOTAL PROTEIN: 7.5 g/dL (ref 6.0–8.5)

## 2016-01-29 LAB — IRON: IRON: 43 ug/dL (ref 27–139)

## 2016-01-29 LAB — PARATHYROID HORMONE, INTACT (NO CA): PTH: 26 pg/mL (ref 15–65)

## 2016-01-29 LAB — CALCIUM, IONIZED: Calcium, Ion: 5.4 mg/dL (ref 4.5–5.6)

## 2016-01-29 NOTE — Telephone Encounter (Signed)
Advised pt of lab results. Pt verbally acknowledges understanding. Emily Drozdowski, CMA   

## 2016-01-29 NOTE — Telephone Encounter (Signed)
-----   Message from Jerrol Banana., MD sent at 01/29/2016 11:40 AM EST ----- Labs okay. Calcium back to normal.

## 2016-01-31 NOTE — Progress Notes (Signed)
Appt not needed today.

## 2016-02-08 DIAGNOSIS — M1711 Unilateral primary osteoarthritis, right knee: Secondary | ICD-10-CM | POA: Diagnosis not present

## 2016-02-08 DIAGNOSIS — M2351 Chronic instability of knee, right knee: Secondary | ICD-10-CM | POA: Diagnosis not present

## 2016-02-11 DIAGNOSIS — S43422A Sprain of left rotator cuff capsule, initial encounter: Secondary | ICD-10-CM | POA: Diagnosis not present

## 2016-02-11 DIAGNOSIS — G5601 Carpal tunnel syndrome, right upper limb: Secondary | ICD-10-CM | POA: Diagnosis not present

## 2016-02-11 DIAGNOSIS — M5136 Other intervertebral disc degeneration, lumbar region: Secondary | ICD-10-CM | POA: Diagnosis not present

## 2016-02-11 DIAGNOSIS — M545 Low back pain: Secondary | ICD-10-CM | POA: Diagnosis not present

## 2016-02-13 DIAGNOSIS — L401 Generalized pustular psoriasis: Secondary | ICD-10-CM | POA: Diagnosis not present

## 2016-03-10 DIAGNOSIS — M19011 Primary osteoarthritis, right shoulder: Secondary | ICD-10-CM | POA: Diagnosis not present

## 2016-03-18 DIAGNOSIS — M7551 Bursitis of right shoulder: Secondary | ICD-10-CM | POA: Diagnosis not present

## 2016-03-20 ENCOUNTER — Telehealth: Payer: Self-pay

## 2016-03-20 NOTE — Telephone Encounter (Signed)
Dr Rosanna Randy company called about the forms they have been faxing for patient to get brace for her shoulder, states patient requested this and wanted to see if it has been filled out. I remember seen them not sure when the last time.-aa

## 2016-03-25 NOTE — Telephone Encounter (Signed)
I do not see pt evaluation for this here or ortho--face to face ?

## 2016-03-25 NOTE — Telephone Encounter (Signed)
Spoke with pt over a week ago about this. She says she did not request this and does not want a brace at time.

## 2016-04-04 DIAGNOSIS — M25571 Pain in right ankle and joints of right foot: Secondary | ICD-10-CM | POA: Diagnosis not present

## 2016-04-04 DIAGNOSIS — M25532 Pain in left wrist: Secondary | ICD-10-CM | POA: Diagnosis not present

## 2016-04-04 DIAGNOSIS — M25531 Pain in right wrist: Secondary | ICD-10-CM | POA: Diagnosis not present

## 2016-04-04 DIAGNOSIS — M25572 Pain in left ankle and joints of left foot: Secondary | ICD-10-CM | POA: Diagnosis not present

## 2016-04-10 DIAGNOSIS — M19072 Primary osteoarthritis, left ankle and foot: Secondary | ICD-10-CM | POA: Diagnosis not present

## 2016-04-10 DIAGNOSIS — M25572 Pain in left ankle and joints of left foot: Secondary | ICD-10-CM | POA: Diagnosis not present

## 2016-04-10 DIAGNOSIS — M19042 Primary osteoarthritis, left hand: Secondary | ICD-10-CM | POA: Diagnosis not present

## 2016-04-14 DIAGNOSIS — M7551 Bursitis of right shoulder: Secondary | ICD-10-CM | POA: Diagnosis not present

## 2016-04-29 ENCOUNTER — Telehealth: Payer: Self-pay | Admitting: Family Medicine

## 2016-04-29 NOTE — Telephone Encounter (Signed)
Error/MW °

## 2016-05-01 DIAGNOSIS — S46812A Strain of other muscles, fascia and tendons at shoulder and upper arm level, left arm, initial encounter: Secondary | ICD-10-CM | POA: Diagnosis not present

## 2016-05-01 DIAGNOSIS — M532X7 Spinal instabilities, lumbosacral region: Secondary | ICD-10-CM | POA: Diagnosis not present

## 2016-05-01 DIAGNOSIS — M545 Low back pain: Secondary | ICD-10-CM | POA: Diagnosis not present

## 2016-05-01 DIAGNOSIS — M19012 Primary osteoarthritis, left shoulder: Secondary | ICD-10-CM | POA: Diagnosis not present

## 2016-05-01 DIAGNOSIS — M19072 Primary osteoarthritis, left ankle and foot: Secondary | ICD-10-CM | POA: Diagnosis not present

## 2016-05-01 DIAGNOSIS — M19071 Primary osteoarthritis, right ankle and foot: Secondary | ICD-10-CM | POA: Diagnosis not present

## 2016-05-06 DIAGNOSIS — M19071 Primary osteoarthritis, right ankle and foot: Secondary | ICD-10-CM | POA: Diagnosis not present

## 2016-05-06 DIAGNOSIS — M19041 Primary osteoarthritis, right hand: Secondary | ICD-10-CM | POA: Diagnosis not present

## 2016-05-06 DIAGNOSIS — M19031 Primary osteoarthritis, right wrist: Secondary | ICD-10-CM | POA: Diagnosis not present

## 2016-05-06 DIAGNOSIS — M19072 Primary osteoarthritis, left ankle and foot: Secondary | ICD-10-CM | POA: Diagnosis not present

## 2016-05-08 DIAGNOSIS — M19031 Primary osteoarthritis, right wrist: Secondary | ICD-10-CM | POA: Diagnosis not present

## 2016-05-08 DIAGNOSIS — M19041 Primary osteoarthritis, right hand: Secondary | ICD-10-CM | POA: Diagnosis not present

## 2016-05-08 DIAGNOSIS — M19071 Primary osteoarthritis, right ankle and foot: Secondary | ICD-10-CM | POA: Diagnosis not present

## 2016-05-08 DIAGNOSIS — M19072 Primary osteoarthritis, left ankle and foot: Secondary | ICD-10-CM | POA: Diagnosis not present

## 2016-05-14 DIAGNOSIS — M13812 Other specified arthritis, left shoulder: Secondary | ICD-10-CM | POA: Diagnosis not present

## 2016-05-16 DIAGNOSIS — M19011 Primary osteoarthritis, right shoulder: Secondary | ICD-10-CM | POA: Diagnosis not present

## 2016-06-02 IMAGING — CR DG KNEE 1-2V*R*
1 series · 2 of 2 positions shown · non-contrast
Comparison: Portable exam at 4664 hours compared to 06/09/2009 and
correlated with CT knee 06/06/2014

CLINICAL DATA: Postoperative RIGHT knee surgery

EXAM:
RIGHT KNEE - 1-2 VIEW

[Series 1: ap · 0.17mm/px · 2 of 2 slices shown]
[im 1/2]
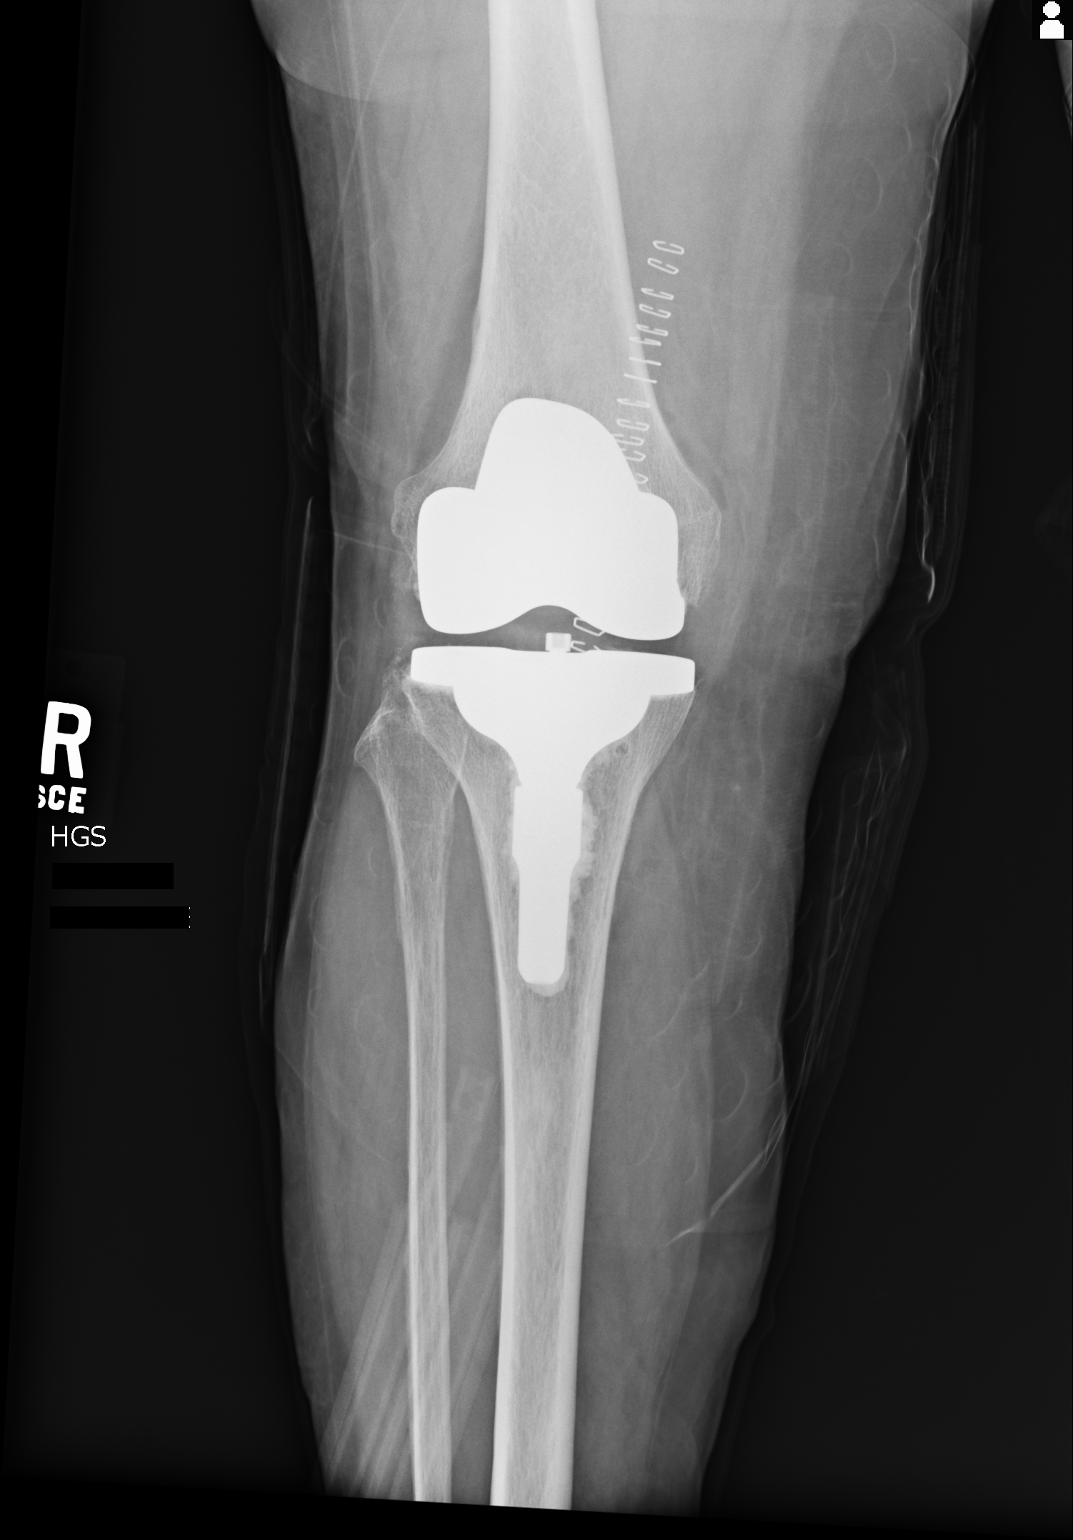
[im 2/2]
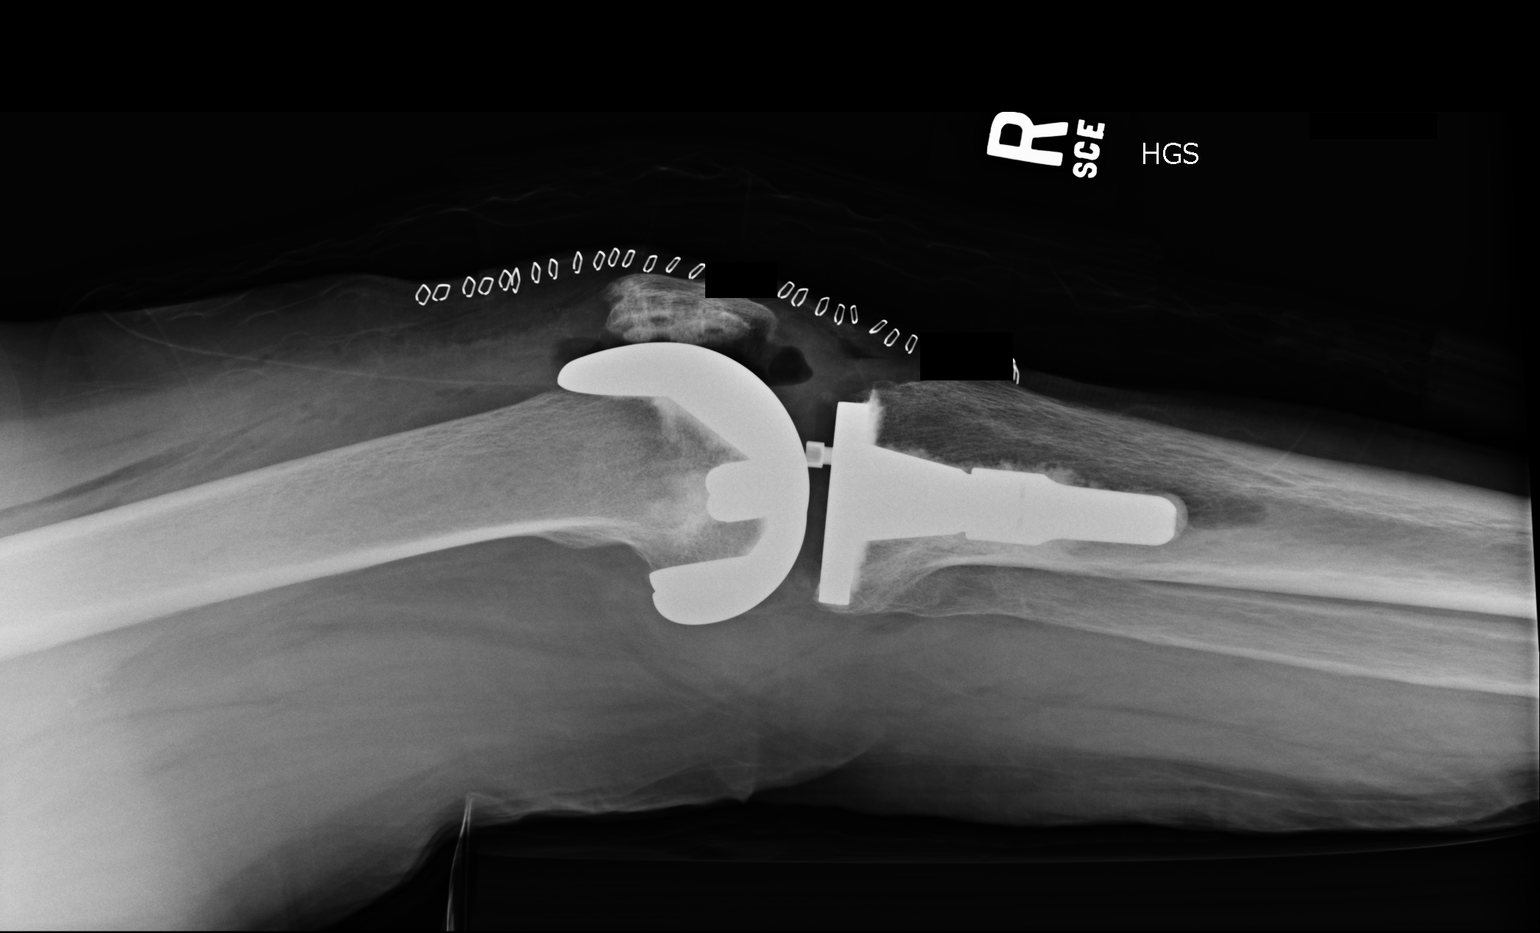

[2 of 2 positions shown; findings below may reference images not displayed]

FINDINGS: Components of RIGHT knee prosthesis in expected positions.

Bones appear demineralized.

No acute fracture, dislocation or bone destruction.

No periprosthetic lucency.

Expected postsurgical soft tissue changes and superimposed
artifacts.
IMPRESSION: Osseous demineralization.

RIGHT knee prosthesis without acute complication.

## 2016-06-26 ENCOUNTER — Other Ambulatory Visit: Payer: Self-pay | Admitting: Family Medicine

## 2016-06-26 DIAGNOSIS — Z1231 Encounter for screening mammogram for malignant neoplasm of breast: Secondary | ICD-10-CM

## 2016-07-08 ENCOUNTER — Encounter: Payer: Self-pay | Admitting: Family Medicine

## 2016-07-08 ENCOUNTER — Ambulatory Visit (INDEPENDENT_AMBULATORY_CARE_PROVIDER_SITE_OTHER): Payer: Medicare Other | Admitting: Family Medicine

## 2016-07-08 VITALS — BP 128/60 | HR 100 | Temp 98.3°F | Resp 16 | Wt 151.0 lb

## 2016-07-08 DIAGNOSIS — E1122 Type 2 diabetes mellitus with diabetic chronic kidney disease: Secondary | ICD-10-CM | POA: Diagnosis not present

## 2016-07-08 DIAGNOSIS — N181 Chronic kidney disease, stage 1: Secondary | ICD-10-CM

## 2016-07-08 DIAGNOSIS — I10 Essential (primary) hypertension: Secondary | ICD-10-CM

## 2016-07-08 LAB — POCT GLYCOSYLATED HEMOGLOBIN (HGB A1C)
ESTIMATED AVERAGE GLUCOSE: 143
Hemoglobin A1C: 6.6

## 2016-07-08 NOTE — Progress Notes (Signed)
Patient: Cynthia Castillo Female    DOB: 09/12/1940   76 y.o.   MRN: [id number] Visit Date: 07/08/2016  Today's Provider: [name], MD   Chief Complaint  Patient presents with  . Hypertension  . Diabetes   Subjective:    HPI      Hypertension, follow-up:  BP Readings from Last 3 Encounters:  07/08/16 128/60  12/25/15 136/68  12/18/15 (!) 152/70    She was last seen for hypertension 6 months ago.  BP at that visit was 136/68. Management since that visit includes continuing medications. She is taking Losartan-HCTZ 100-35 mg daily. Amlodipine was on her med list, but she states she has not been taking this medication since autumn. She is not having side effects.  She is exercising. Muscle building exercises. She is adherent to low salt diet.   Outside blood pressures are 120's/60's. She is experiencing lower extremity edema (s/p bilateral knee replacement).  Patient denies chest pain, chest pressure/discomfort, claudication, dyspnea, exertional chest pressure/discomfort, fatigue, irregular heart beat, lower extremity edema, near-syncope, orthopnea, palpitations and syncope.   Cardiovascular risk factors include advanced age (older than 63 for men, 32 for women), diabetes mellitus, dyslipidemia, family history of premature cardiovascular disease and hypertension.    Weight trend: fluctuating a bit Wt Readings from Last 3 Encounters:  07/08/16 151 lb (68.5 kg)  12/25/15 152 lb (68.9 kg)  12/18/15 156 lb (70.8 kg)    Current diet: in general, a "healthy" diet    ------------------------------------------------------------------------  Diabetes Mellitus Type II, Follow-up:   Lab Results  Component Value Date   HGBA1C 6.7 (H) 01/07/2016   HGBA1C 6.6 (H) 06/06/2015   HGBA1C 6.7 04/26/2015    Last seen for diabetes 6 months ago.  Management since then includes none. She is not on medication for this problem. Current symptoms include none and have been  stable. Home blood sugar records: unchanged per pt  Episodes of hypoglycemia? no   Current Insulin Regimen: N/A Most Recent Eye Exam: November 2017. [facility]. Prior visit with dietician: no  Pertinent Labs:    Component Value Date/Time   CHOL 206 (H) 01/07/2016 0958   TRIG 91 01/07/2016 0958   HDL 64 01/07/2016 0958   LDLCALC 124 (H) 01/07/2016 0958   CREATININE 0.78 01/28/2016 1005    ------------------------------------------------------------------------    Allergies  Allergen Reactions  . Latex Anaphylaxis     Current Outpatient Prescriptions:  .  glucose blood (BAYER CONTOUR NEXT TEST) test strip, Reported on 01/31/2015, Disp: , Rfl:  .  losartan-hydrochlorothiazide (HYZAAR) 100-25 MG tablet, Take 1 tablet by mouth daily., Disp: 90 tablet, Rfl: 3 .  Multiple Vitamin (MULTIVITAMIN) capsule, Take 1 capsule by mouth daily., Disp: , Rfl:  .  Multiple Vitamins-Minerals (CENTRUM SILVER 50+WOMEN PO), Take by mouth., Disp: , Rfl:  .  Omega-3 Fatty Acids (FISH OIL) 1200 MG CAPS, Reported on 06/19/2015, Disp: , Rfl:   Review of Systems  Constitutional: Negative for activity change, appetite change, chills, diaphoresis, fatigue, fever and unexpected weight change.  Eyes: Negative for visual disturbance.  Respiratory: Negative for shortness of breath.   Cardiovascular: Negative for chest pain, palpitations and leg swelling.  Endocrine: Negative for polydipsia, polyphagia and polyuria.  Musculoskeletal: Positive for arthralgias and gait problem.  Allergic/Immunologic: Negative.   Psychiatric/Behavioral: Negative.     Social History  Substance Use Topics  . Smoking status: Never Smoker  . Smokeless tobacco: Never Used  . Alcohol use No  Objective:   BP 128/60 (BP Location: Right Arm, Patient Position: Sitting, Cuff Size: Normal)   Pulse 100   Temp 98.3 F (36.8 C) (Oral)   Resp 16   Wt 151 lb (68.5 kg)   BMI 28.53 kg/m  Vitals:   07/08/16 1533  BP: 128/60    Pulse: 100  Resp: 16  Temp: 98.3 F (36.8 C)  TempSrc: Oral  Weight: 151 lb (68.5 kg)     Physical Exam  Constitutional: She is oriented to person, place, and time. She appears well-developed and well-nourished.  HENT:  Head: Normocephalic and atraumatic.  Right Ear: External ear normal.  Left Ear: External ear normal.  Nose: Nose normal.  Eyes: Conjunctivae are normal. No scleral icterus.  Neck: No thyromegaly present.  Cardiovascular: Normal rate and normal heart sounds.   Pulmonary/Chest: Effort normal and breath sounds normal.  Abdominal: Soft.  Neurological: She is alert and oriented to person, place, and time.  Skin: Skin is warm and dry.  Psychiatric: She has a normal mood and affect. Her behavior is normal. Judgment and thought content normal.        Assessment & Plan:     1.HTN   2. Type 2 diabetes mellitus with stage 1 chronic kidney disease, without long-term current use of insulin (HCC)  - POCT glycosylated hemoglobin (Hb A1C)--6.6 today. 3.OA Walks with cane.      Richard Cranford Mon, MD  [facility]

## 2016-07-17 ENCOUNTER — Ambulatory Visit
Admission: RE | Admit: 2016-07-17 | Discharge: 2016-07-17 | Disposition: A | Discharge status: Home / Self Care | Payer: Medicare Other | Source: Ambulatory Visit | Attending: Family Medicine | Admitting: Family Medicine

## 2016-07-17 DIAGNOSIS — Z1231 Encounter for screening mammogram for malignant neoplasm of breast: Secondary | ICD-10-CM | POA: Insufficient documentation

## 2016-08-04 DIAGNOSIS — M25571 Pain in right ankle and joints of right foot: Secondary | ICD-10-CM | POA: Diagnosis not present

## 2016-08-04 DIAGNOSIS — M19072 Primary osteoarthritis, left ankle and foot: Secondary | ICD-10-CM | POA: Diagnosis not present

## 2016-08-04 DIAGNOSIS — S46812A Strain of other muscles, fascia and tendons at shoulder and upper arm level, left arm, initial encounter: Secondary | ICD-10-CM | POA: Diagnosis not present

## 2016-08-04 DIAGNOSIS — M532X7 Spinal instabilities, lumbosacral region: Secondary | ICD-10-CM | POA: Diagnosis not present

## 2016-08-04 DIAGNOSIS — M545 Low back pain: Secondary | ICD-10-CM | POA: Diagnosis not present

## 2016-08-04 DIAGNOSIS — M19012 Primary osteoarthritis, left shoulder: Secondary | ICD-10-CM | POA: Diagnosis not present

## 2016-08-04 DIAGNOSIS — M25572 Pain in left ankle and joints of left foot: Secondary | ICD-10-CM | POA: Diagnosis not present

## 2016-08-04 DIAGNOSIS — M19071 Primary osteoarthritis, right ankle and foot: Secondary | ICD-10-CM | POA: Diagnosis not present

## 2016-08-22 ENCOUNTER — Other Ambulatory Visit: Payer: Self-pay | Admitting: Family Medicine

## 2016-08-22 DIAGNOSIS — I10 Essential (primary) hypertension: Secondary | ICD-10-CM

## 2016-12-02 ENCOUNTER — Ambulatory Visit: Payer: Medicare Other | Admitting: Family Medicine

## 2016-12-02 VITALS — BP 158/62 | HR 84 | Temp 98.1°F | Resp 16 | Wt 151.8 lb

## 2016-12-02 DIAGNOSIS — E7849 Other hyperlipidemia: Secondary | ICD-10-CM

## 2016-12-02 DIAGNOSIS — I10 Essential (primary) hypertension: Secondary | ICD-10-CM | POA: Diagnosis not present

## 2016-12-02 DIAGNOSIS — E1122 Type 2 diabetes mellitus with diabetic chronic kidney disease: Secondary | ICD-10-CM | POA: Diagnosis not present

## 2016-12-02 DIAGNOSIS — N181 Chronic kidney disease, stage 1: Secondary | ICD-10-CM | POA: Diagnosis not present

## 2016-12-02 DIAGNOSIS — Z23 Encounter for immunization: Secondary | ICD-10-CM

## 2016-12-02 LAB — POCT GLYCOSYLATED HEMOGLOBIN (HGB A1C): HEMOGLOBIN A1C: 6.5

## 2016-12-02 NOTE — Patient Instructions (Signed)
Bring in b/p readings on the next visit.

## 2016-12-02 NOTE — Progress Notes (Signed)
Cynthia Castillo  MRN: [id number] DOB: 09-18-1940  Subjective:  HPI  Patient is here for follow up. Last office visit was on 07/08/16. Lab: MetC and CBC on 01/28/16, routine lab work done on 01/07/16. DM: patient is checking her sugar once a day in the morning, readings are usually around 128-130. Some tingling sensation present in feet. Patient is not on medication for diabetes. Lab Results  Component Value Date   HGBA1C 6.6 07/08/2016   Wt Readings from Last 3 Encounters:  12/02/16 151 lb 12.8 oz (68.9 kg)  07/08/16 151 lb (68.5 kg)  12/25/15 152 lb (68.9 kg)   HTN: patient is checking her b/p and readings range 139-150/60. No chest pain or tightness. Patient is taking Hyzaar. BP Readings from Last 3 Encounters:  12/02/16 (!) 158/62  07/08/16 128/60  12/25/15 136/68   Patient Active Problem List   Diagnosis Date Noted  . Primary osteoarthritis of left knee 06/19/2015  . Encounter for screening colonoscopy 01/31/2015  . Arthritis 01/01/2015  . Allergic rhinitis 08/15/2014  . Arthritis of knee 08/15/2014  . Endometriosis 08/15/2014  . HLD (hyperlipidemia) 08/15/2014  . Adult hypothyroidism 08/15/2014  . Adiposity 08/15/2014  . Primary osteoarthritis of knee 07/25/2014  . Mastalgia 02/01/2014  . Diabetes mellitus, type 2 ([place]) 12/27/2007  . Avitaminosis D 12/27/2007  . Deficiency, disaccharidase intestinal 09/18/2005  . Allergic reaction 12/09/1994  . Bloodgood disease 10/19/1990  . Essential (primary) hypertension 10/19/1990    Past Medical History:  Diagnosis Date  . Arthritis    hands  . Complication of anesthesia   . Diabetes mellitus without complication ([place]) 6195   diet control  . Hypertension   . Murmur   . PONV (postoperative nausea and vomiting)     Social History   Socioeconomic History  . Marital status: Widowed    Spouse name: Not on file  . Number of children: Not on file  . Years of education: Not on file  . Highest education level: Not on  file  Social Needs  . Financial resource strain: Not on file  . Food insecurity - worry: Not on file  . Food insecurity - inability: Not on file  . Transportation needs - medical: Not on file  . Transportation needs - non-medical: Not on file  Occupational History  . Not on file  Tobacco Use  . Smoking status: Never Smoker  . Smokeless tobacco: Never Used  Substance and Sexual Activity  . Alcohol use: No    Alcohol/week: 0.0 oz  . Drug use: No  . Sexual activity: No  Other Topics Concern  . Not on file  Social History Narrative  . Not on file    Outpatient Encounter Medications as of 12/02/2016  Medication Sig Note  . glucose blood (BAYER CONTOUR NEXT TEST) test strip Reported on 01/31/2015 08/15/2014: diagnosis code: E11.40 Received from: [facility]  . losartan-hydrochlorothiazide (HYZAAR) 100-25 MG tablet TAKE ONE TABLET BY MOUTH ONCE DAILY   . Multiple Vitamins-Minerals (CENTRUM SILVER 50+WOMEN PO) Take by mouth.   . Omega-3 Fatty Acids (FISH OIL) 1200 MG CAPS Reported on 06/19/2015 08/15/2014: DX: 272.0 Received from: [facility]  . [DISCONTINUED] Multiple Vitamin (MULTIVITAMIN) capsule Take 1 capsule by mouth daily.    No facility-administered encounter medications on file as of 12/02/2016.     Allergies  Allergen Reactions  . Latex Anaphylaxis    Review of Systems  Constitutional: Negative.   Respiratory: Negative.   Cardiovascular: Negative.   Musculoskeletal:  Negative.        Leg stiffness, makes it hard to walk good. Uses cane to ambulate  Neurological: Positive for tingling (in feet a little bit). Negative for dizziness.  Endo/Heme/Allergies: Negative.   Psychiatric/Behavioral: Negative.     Objective:  BP (!) 158/62   Pulse 84   Temp 98.1 F (36.7 C)   Resp 16   Wt 151 lb 12.8 oz (68.9 kg)   BMI 28.68 kg/m   Physical Exam  Constitutional: She is oriented to person, place, and time and well-developed,  well-nourished, and in no distress.  HENT:  Head: Normocephalic and atraumatic.  Eyes: Conjunctivae are normal. Pupils are equal, round, and reactive to light. No scleral icterus.  Neck: Normal range of motion. Neck supple. No thyromegaly present.  Cardiovascular: Normal rate, regular rhythm, normal heart sounds and intact distal pulses. Exam reveals no gallop.  No murmur heard. Pulmonary/Chest: Effort normal and breath sounds normal. No respiratory distress. She has no wheezes.  Musculoskeletal: She exhibits no edema or deformity.  Neurological: She is alert and oriented to person, place, and time.  Skin: Skin is warm and dry.  Psychiatric: Mood, memory, affect and judgment normal.   Diabetic Foot Exam - Simple   Simple Foot Form Diabetic Foot exam was performed with the following findings:  Yes 12/02/2016  3:01 PM  Visual Inspection No deformities, no ulcerations, no other skin breakdown bilaterally:  Yes Sensation Testing Intact to touch and monofilament testing bilaterally:  Yes Pulse Check Posterior Tibialis and Dorsalis pulse intact bilaterally:  Yes Comments    Assessment and Plan :  1. Type 2 diabetes mellitus with stage 1 chronic kidney disease, without long-term current use of insulin (HCC) A1C 6.5. Work on habits.  2. Essential (primary) hypertension Elevated today. Discussed this with patient. Will follow before making any changes. Re check on the next visit.  3. Other hyperlipidemia Will re check levels today, discussed with patient that we may need to start treatment for this. Pending results.  4. Need for influenza vaccination We are out of stock on this vaccine. Re scheduled for this. 5.OA  HPI, Exam and A&P transcribed by Tiffany Kocher, RMA under direction and in the presence of Miguel Aschoff, MD. I have done the exam and reviewed the chart and it is accurate to the best of my knowledge. Development worker, community has been used and  any errors in dictation or  transcription are unintentional. Miguel Aschoff M.D. [facility]

## 2016-12-13 LAB — COMPLETE METABOLIC PANEL WITH GFR
AG Ratio: 1 (calc) (ref 1.0–2.5)
ALBUMIN MSPROF: 3.8 g/dL (ref 3.6–5.1)
ALT: 5 U/L — ABNORMAL LOW (ref 6–29)
AST: 10 U/L (ref 10–35)
Alkaline phosphatase (APISO): 75 U/L (ref 33–130)
BILIRUBIN TOTAL: 0.3 mg/dL (ref 0.2–1.2)
BUN: 11 mg/dL (ref 7–25)
CHLORIDE: 107 mmol/L (ref 98–110)
CO2: 25 mmol/L (ref 20–32)
CREATININE: 0.85 mg/dL (ref 0.60–0.93)
Calcium: 9.6 mg/dL (ref 8.6–10.4)
GFR, EST AFRICAN AMERICAN: 78 mL/min/{1.73_m2} (ref >=60)
GFR, Est Non African American: 67 mL/min/{1.73_m2} (ref >=60)
GLUCOSE: 107 mg/dL — AB (ref 65–99)
Globulin: 3.7 g/dL (calc) (ref 1.9–3.7)
Potassium: 4.2 mmol/L (ref 3.5–5.3)
Sodium: 139 mmol/L (ref 135–146)
TOTAL PROTEIN: 7.5 g/dL (ref 6.1–8.1)

## 2016-12-13 LAB — LIPID PANEL
CHOLESTEROL: 197 mg/dL (ref <200)
HDL: 64 mg/dL (ref >50)
LDL Cholesterol (Calc): 113 mg/dL (calc) — ABNORMAL HIGH
Non-HDL Cholesterol (Calc): 133 mg/dL (calc) — ABNORMAL HIGH (ref <130)
TRIGLYCERIDES: 98 mg/dL (ref <150)
Total CHOL/HDL Ratio: 3.1 (calc) (ref <5.0)

## 2016-12-13 LAB — CBC WITH DIFFERENTIAL/PLATELET
BASOS ABS: 47 {cells}/uL (ref 0–200)
Basophils Relative: 0.8 %
EOS PCT: 2 %
Eosinophils Absolute: 118 cells/uL (ref 15–500)
HCT: 35.3 % (ref 35.0–45.0)
Hemoglobin: 11.1 g/dL — ABNORMAL LOW (ref 11.7–15.5)
Lymphs Abs: 1451 cells/uL (ref 850–3900)
MCH: 24.2 pg — ABNORMAL LOW (ref 27.0–33.0)
MCHC: 31.4 g/dL — ABNORMAL LOW (ref 32.0–36.0)
MCV: 76.9 fL — AB (ref 80.0–100.0)
MONOS PCT: 10.9 %
MPV: 9.9 fL (ref 7.5–12.5)
NEUTROS PCT: 61.7 %
Neutro Abs: 3640 cells/uL (ref 1500–7800)
PLATELETS: 479 10*3/uL — AB (ref 140–400)
RBC: 4.59 10*6/uL (ref 3.80–5.10)
RDW: 13.9 % (ref 11.0–15.0)
TOTAL LYMPHOCYTE: 24.6 %
WBC mixed population: 643 cells/uL (ref 200–950)
WBC: 5.9 10*3/uL (ref 3.8–10.8)

## 2016-12-13 LAB — TSH: TSH: 2.82 mIU/L (ref 0.40–4.50)

## 2016-12-23 ENCOUNTER — Ambulatory Visit (INDEPENDENT_AMBULATORY_CARE_PROVIDER_SITE_OTHER): Payer: Medicare Other

## 2016-12-23 VITALS — BP 132/82 | HR 88 | Temp 98.7°F | Ht 61.0 in | Wt 151.2 lb

## 2016-12-23 DIAGNOSIS — Z23 Encounter for immunization: Secondary | ICD-10-CM | POA: Diagnosis not present

## 2016-12-23 DIAGNOSIS — Z0000 Encounter for general adult medical examination without abnormal findings: Secondary | ICD-10-CM

## 2016-12-23 NOTE — Patient Instructions (Signed)
Cynthia Castillo , Thank you for taking time to come for your Medicare Wellness Visit. I appreciate your ongoing commitment to your health goals. Please review the following plan we discussed and let me know if I can assist you in the future.   Screening recommendations/referrals: Colonoscopy: up to date Mammogram: up to date Bone Density: up to date Recommended yearly ophthalmology/optometry visit for glaucoma screening and checkup Recommended yearly dental visit for hygiene and checkup  Vaccinations: Influenza vaccine: completed today Pneumococcal vaccine: completed series Tdap vaccine: up to date  Shingles vaccine: completed 10/2012    Advanced directives: Advance directive discussed with you today. I have provided a copy for you to complete at home and have notarized. Once this is complete please bring a copy in to our office so we can scan it into your chart.  Conditions/risks identified: Fall risk prevention; Recommend increasing water intake to 4-6 glasses a day.   Next appointment: 01/19/17   Preventive Care 75 Years and Older, Female Preventive care refers to lifestyle choices and visits with your health care provider that can promote health and wellness. What does preventive care include?  A yearly physical exam. This is also called an annual well check.  Dental exams once or twice a year.  Routine eye exams. Ask your health care provider how often you should have your eyes checked.  Personal lifestyle choices, including:  Daily care of your teeth and gums.  Regular physical activity.  Eating a healthy diet.  Avoiding tobacco and drug use.  Limiting alcohol use.  Practicing safe sex.  Taking low-dose aspirin every day.  Taking vitamin and mineral supplements as recommended by your health care provider. What happens during an annual well check? The services and screenings done by your health care provider during your annual well check will depend on your age,  overall health, lifestyle risk factors, and family history of disease. Counseling  Your health care provider may ask you questions about your:  Alcohol use.  Tobacco use.  Drug use.  Emotional well-being.  Home and relationship well-being.  Sexual activity.  Eating habits.  History of falls.  Memory and ability to understand (cognition).  Work and work Statistician.  Reproductive health. Screening  You may have the following tests or measurements:  Height, weight, and BMI.  Blood pressure.  Lipid and cholesterol levels. These may be checked every 5 years, or more frequently if you are over 62 years old.  Skin check.  Lung cancer screening. You may have this screening every year starting at age 21 if you have a 30-pack-year history of smoking and currently smoke or have quit within the past 15 years.  Fecal occult blood test (FOBT) of the stool. You may have this test every year starting at age 39.  Flexible sigmoidoscopy or colonoscopy. You may have a sigmoidoscopy every 5 years or a colonoscopy every 10 years starting at age 55.  Hepatitis C blood test.  Hepatitis B blood test.  Sexually transmitted disease (STD) testing.  Diabetes screening. This is done by checking your blood sugar (glucose) after you have not eaten for a while (fasting). You may have this done every 1-3 years.  Bone density scan. This is done to screen for osteoporosis. You may have this done starting at age 61.  Mammogram. This may be done every 1-2 years. Talk to your health care provider about how often you should have regular mammograms. Talk with your health care provider about your test results, treatment options,  and if necessary, the need for more tests. Vaccines  Your health care provider may recommend certain vaccines, such as:  Influenza vaccine. This is recommended every year.  Tetanus, diphtheria, and acellular pertussis (Tdap, Td) vaccine. You may need a Td booster every 10  years.  Zoster vaccine. You may need this after age 14.  Pneumococcal 13-valent conjugate (PCV13) vaccine. One dose is recommended after age 1.  Pneumococcal polysaccharide (PPSV23) vaccine. One dose is recommended after age 46. Talk to your health care provider about which screenings and vaccines you need and how often you need them. This information is not intended to replace advice given to you by your health care provider. Make sure you discuss any questions you have with your health care provider. Document Released: 02/02/2015 Document Revised: 09/26/2015 Document Reviewed: 11/07/2014 Elsevier Interactive Patient Education  2017 [place] Prevention in the Home Falls can cause injuries. They can happen to people of all ages. There are many things you can do to make your home safe and to help prevent falls. What can I do on the outside of my home?  Regularly fix the edges of walkways and driveways and fix any cracks.  Remove anything that might make you trip as you walk through a door, such as a raised step or threshold.  Trim any bushes or trees on the path to your home.  Use bright outdoor lighting.  Clear any walking paths of anything that might make someone trip, such as rocks or tools.  Regularly check to see if handrails are loose or broken. Make sure that both sides of any steps have handrails.  Any raised decks and porches should have guardrails on the edges.  Have any leaves, snow, or ice cleared regularly.  Use sand or salt on walking paths during winter.  Clean up any spills in your garage right away. This includes oil or grease spills. What can I do in the bathroom?  Use night lights.  Install grab bars by the toilet and in the tub and shower. Do not use towel bars as grab bars.  Use non-skid mats or decals in the tub or shower.  If you need to sit down in the shower, use a plastic, non-slip stool.  Keep the floor dry. Clean up any water that  spills on the floor as soon as it happens.  Remove soap buildup in the tub or shower regularly.  Attach bath mats securely with double-sided non-slip rug tape.  Do not have throw rugs and other things on the floor that can make you trip. What can I do in the bedroom?  Use night lights.  Make sure that you have a light by your bed that is easy to reach.  Do not use any sheets or blankets that are too big for your bed. They should not hang down onto the floor.  Have a firm chair that has side arms. You can use this for support while you get dressed.  Do not have throw rugs and other things on the floor that can make you trip. What can I do in the kitchen?  Clean up any spills right away.  Avoid walking on wet floors.  Keep items that you use a lot in easy-to-reach places.  If you need to reach something above you, use a strong step stool that has a grab bar.  Keep electrical cords out of the way.  Do not use floor polish or wax that makes floors slippery. If  you must use wax, use non-skid floor wax.  Do not have throw rugs and other things on the floor that can make you trip. What can I do with my stairs?  Do not leave any items on the stairs.  Make sure that there are handrails on both sides of the stairs and use them. Fix handrails that are broken or loose. Make sure that handrails are as long as the stairways.  Check any carpeting to make sure that it is firmly attached to the stairs. Fix any carpet that is loose or worn.  Avoid having throw rugs at the top or bottom of the stairs. If you do have throw rugs, attach them to the floor with carpet tape.  Make sure that you have a light switch at the top of the stairs and the bottom of the stairs. If you do not have them, ask someone to add them for you. What else can I do to help prevent falls?  Wear shoes that:  Do not have high heels.  Have rubber bottoms.  Are comfortable and fit you well.  Are closed at the  toe. Do not wear sandals.  If you use a stepladder:  Make sure that it is fully opened. Do not climb a closed stepladder.  Make sure that both sides of the stepladder are locked into place.  Ask someone to hold it for you, if possible.  Clearly mark and make sure that you can see:  Any grab bars or handrails.  First and last steps.  Where the edge of each step is.  Use tools that help you move around (mobility aids) if they are needed. These include:  Canes.  Walkers.  Scooters.  Crutches.  Turn on the lights when you go into a dark area. Replace any light bulbs as soon as they burn out.  Set up your furniture so you have a clear path. Avoid moving your furniture around.  If any of your floors are uneven, fix them.  If there are any pets around you, be aware of where they are.  Review your medicines with your doctor. Some medicines can make you feel dizzy. This can increase your chance of falling. Ask your doctor what other things that you can do to help prevent falls. This information is not intended to replace advice given to you by your health care provider. Make sure you discuss any questions you have with your health care provider. Document Released: 11/02/2008 Document Revised: 06/14/2015 Document Reviewed: 02/10/2014 Elsevier Interactive Patient Education  2017 [employer].

## 2016-12-23 NOTE — Progress Notes (Signed)
Subjective:   Cynthia Castillo is a 76 y.o. female who presents for Medicare Annual (Subsequent) preventive examination.  Review of Systems:  N/A  Cardiac Risk Factors include: advanced age (>3men, >87 women);hypertension     Objective:     Vitals: BP 132/82 (BP Location: Left Arm)   Pulse 88   Temp 98.7 F (37.1 C) (Oral)   Ht 5\' 1"  (1.549 m)   Wt 151 lb 3.2 oz (68.6 kg)   BMI 28.57 kg/m   Body mass index is 28.57 kg/m.  Advanced Directives 12/23/2016 12/05/2015 06/19/2015 06/06/2015 08/29/2014 07/25/2014 07/12/2014  Does Patient Have a Medical Advance Directive? No Yes Yes Yes No No No  Type of Advance Directive - Living will Living will Living will - - -  Copy of [place] in Chart? - No - copy requested - No - copy requested - - -  Would patient like information on creating a medical advance directive? Yes (MAU/Ambulatory/Procedural Areas - Information given) - - - - Yes - Spiritual care consult ordered -    Tobacco Social History   Tobacco Use  Smoking Status Never Smoker  Smokeless Tobacco Never Used     Counseling given: Not Answered   Clinical Intake:  Pre-visit preparation completed: Yes  Pain : No/denies pain Pain Score: 0-No pain     Nutritional Status: BMI 25 -29 Overweight Nutritional Risks: None Diabetes: Yes CBG done?: No Did pt. bring in CBG monitor from home?: No  Activities of Daily Living: Independent Ambulation: Independent with device- listed below Home Assistive Devices/Equipment: Other (Comment)(has grab bars in bathroom) Medication Administration: Independent Home Management: Independent  Barriers to Care Management & Learning: None  Do you feel unsafe in your current relationship?: No(widowed) Do you feel physically threatened by others?: No Anyone hurting you at home, work, or school?: No Unable to ask?: No Information provided on Community resources: No  How often do you need to have someone help you when you  read instructions, pamphlets, or other written materials from your doctor or pharmacy?: 1 - Never What is the last grade level you completed in school?: 13 years of schooling  Interpreter Needed?: No  Information entered by :: [facility], LPN  Past Medical History:  Diagnosis Date  . Arthritis    hands  . Complication of anesthesia   . Diabetes mellitus without complication ([place]) 4540   diet control  . Hypertension   . Murmur   . PONV (postoperative nausea and vomiting)    Past Surgical History:  Procedure Laterality Date  . ABDOMINAL HYSTERECTOMY  1972  . APPENDECTOMY    . BREAST CYST EXCISION Left 1972  . COLONOSCOPY  2007   Dr Bary Castilla  . COLONOSCOPY WITH PROPOFOL N/A 03/21/2015   Procedure: COLONOSCOPY WITH PROPOFOL;  Surgeon: Robert Bellow, MD;  Location[facility];  Service: Endoscopy;  Laterality: N/A;  . TOTAL KNEE ARTHROPLASTY Right 07/25/2014   Procedure: TOTAL KNEE ARTHROPLASTY;  Surgeon: Hessie Knows, MD;  Location: [facility] ORS;  Service: Orthopedics;  Laterality: Right;  . TOTAL KNEE ARTHROPLASTY Left 06/19/2015   Procedure: TOTAL KNEE ARTHROPLASTY;  Surgeon: Hessie Knows, MD;  Location: [facility] ORS;  Service: Orthopedics;  Laterality: Left;   Family History  Problem Relation Age of Onset  . Stroke Mother   . Heart attack Father   . Cancer Sister 27       stomach/[place]  . Cancer Sister 79       breast/[place]  . Breast  cancer Sister   . Cancer Maternal Grandmother        unknown   Social History   Socioeconomic History  . Marital status: Widowed    Spouse name: None  . Number of children: None  . Years of education: None  . Highest education level: None  Social Needs  . Financial resource strain: None  . Food insecurity - worry: None  . Food insecurity - inability: None  . Transportation needs - medical: None  . Transportation needs - non-medical: None  Occupational History  . None  Tobacco Use  . Smoking status: Never Smoker  . Smokeless  tobacco: Never Used  Substance and Sexual Activity  . Alcohol use: No    Alcohol/week: 0.0 oz  . Drug use: No  . Sexual activity: No  Other Topics Concern  . None  Social History Narrative  . None    Outpatient Encounter Medications as of 12/23/2016  Medication Sig  . glucose blood (BAYER CONTOUR NEXT TEST) test strip Reported on 01/31/2015  . losartan-hydrochlorothiazide (HYZAAR) 100-25 MG tablet TAKE ONE TABLET BY MOUTH ONCE DAILY  . Multiple Vitamins-Minerals (CENTRUM SILVER 50+WOMEN PO) Take by mouth daily.   . Omega-3 Fatty Acids (FISH OIL) 1200 MG CAPS Reported on 06/19/2015   No facility-administered encounter medications on file as of 12/23/2016.     Activities of Daily Living In your present state of health, do you have any difficulty performing the following activities: 12/23/2016  Hearing? Y  Comment occasionally due to wax build up in ears  Vision? N  Difficulty concentrating or making decisions? N  Walking or climbing stairs? N  Dressing or bathing? N  Doing errands, shopping? N  Preparing Food and eating ? N  Using the Toilet? N  In the past six months, have you accidently leaked urine? N  Do you have problems with loss of bowel control? N  Managing your Medications? N  Managing your Finances? N  Housekeeping or managing your Housekeeping? N  Some recent data might be hidden    Patient Care Team: Jerrol Banana., MD as PCP - General (Family Medicine) Bary Castilla, Forest Gleason, MD (General Surgery) Leandrew Koyanagi, MD as Referring Physician (Ophthalmology)    Assessment:     Exercise Activities and Dietary recommendations Current Exercise Habits: Home exercise routine, Type of exercise: stretching, Time (Minutes): 20, Frequency (Times/Week): 3, Weekly Exercise (Minutes/Week): 60, Intensity: Mild  Goals      Patient Stated   . DIET - INCREASE WATER INTAKE (pt-stated)     Recommend increasing water intake to 4-6 glasses a day.       Fall  Risk Fall Risk  12/23/2016 12/05/2015 10/23/2014  Falls in the past year? Yes No No  Number falls in past yr: 1 - -  Injury with Fall? Yes - -  Comment had to have both knees replaced following accident (fell out of bed) - -  Follow up Falls prevention discussed - -   Is the patient's home free of loose throw rugs in walkways, pet beds, electrical cords, etc?   yes      Grab bars in the bathroom? yes      Handrails on the stairs?   yes      Adequate lighting?   yes  Depression Screen PHQ 2/9 Scores 12/23/2016 12/23/2016 12/05/2015 10/23/2014  PHQ - 2 Score 1 1 0 0  PHQ- 9 Score 5 - - -     Cognitive Function: Pt declined screening  today.     6CIT Screen 12/05/2015  What Year? 0 points  What month? 0 points  What time? 0 points  Count back from 20 0 points  Months in reverse 2 points  Repeat phrase 4 points  Total Score 6    Immunization History  Administered Date(s) Administered  . Influenza, High Dose Seasonal PF 10/23/2014, 09/17/2015, 12/23/2016  . Pneumococcal Conjugate-13 08/16/2013  . Pneumococcal Polysaccharide-23 11/20/2010  . Td 08/25/2003  . Tdap 10/20/2012  . Zoster 10/20/2012   Screening Tests Health Maintenance  Topic Date Due  . OPHTHALMOLOGY EXAM  10/12/2015  . HEMOGLOBIN A1C  06/01/2017  . FOOT EXAM  12/02/2017  . TETANUS/TDAP  10/21/2022  . COLONOSCOPY  03/20/2025  . INFLUENZA VACCINE  Completed  . DEXA SCAN  Completed  . PNA vac Low Risk Adult  Completed   Cancer Screenings: Lung: Low Dose CT Chest recommended if Age 73-80 years, 30 pack-year currently smoking OR have quit w/in 15years. Patient does not qualify. Breast: Up to date on Mammogram? Yes  Up to date of Bone Density/Dexa? Yes Colorectal: completed 03/21/15  Additional Screenings:  Hepatitis B/HIV/Syphillis: Pt declined today. Hepatitis C Screening: Pt declined today.     Plan:  I have personally reviewed and addressed the Medicare Annual Wellness questionnaire and have noted the  following in the patient's chart:  A. Medical and social history B. Use of alcohol, tobacco or illicit drugs  C. Current medications and supplements D. Functional ability and status E.  Nutritional status F.  Physical activity G. Advance directives H. List of other physicians I.  Hospitalizations, surgeries, and ER visits in previous 12 months J.  [place] such as hearing and vision if needed, cognitive and depression L. Referrals and appointments - none  In addition, I have reviewed and discussed with patient certain preventive protocols, quality metrics, and best practice recommendations. A written personalized care plan for preventive services as well as general preventive health recommendations were provided to patient.  See attached scanned questionnaire for additional information.   Signed,  Fabio Neighbors, LPN Nurse Health Advisor   Nurse Recommendations: None. Requested a copy of the next eye exam to update chart. Pt has an apt in a couple weeks to have an eye exam.

## 2017-01-14 LAB — HM DIABETES EYE EXAM

## 2017-01-16 ENCOUNTER — Encounter: Payer: Self-pay | Admitting: Ophthalmology

## 2017-01-19 ENCOUNTER — Ambulatory Visit: Payer: Medicare Other | Admitting: Family Medicine

## 2017-01-19 VITALS — BP 148/70 | HR 82 | Temp 97.7°F | Resp 16 | Wt 157.0 lb

## 2017-01-19 DIAGNOSIS — M170 Bilateral primary osteoarthritis of knee: Secondary | ICD-10-CM

## 2017-01-19 DIAGNOSIS — J309 Allergic rhinitis, unspecified: Secondary | ICD-10-CM

## 2017-01-19 DIAGNOSIS — I10 Essential (primary) hypertension: Secondary | ICD-10-CM | POA: Diagnosis not present

## 2017-01-19 MED ORDER — AMLODIPINE BESYLATE 5 MG PO TABS: 5.0000 mg | ORAL_TABLET | Freq: Every day | ORAL | 3 refills | Status: DC

## 2017-01-19 NOTE — Progress Notes (Signed)
Patient: Cynthia Castillo Female    DOB: [birth year]   76 y.o.   MRN: [id number] Visit Date: 01/19/2017  Today's Provider: [name], MD   Chief Complaint  Patient presents with  . Hypertension   Subjective:    HPI  Hypertension, follow-up:  BP Readings from Last 3 Encounters:  01/19/17 (!) 148/70  12/23/16 132/82  12/02/16 (!) 158/62    She was last seen for hypertension 1 months ago.  BP at that visit was 158/82. Management since that visit includes none, pt told to monitor BP at home. She reports good compliance with treatment. She is not having side effects.  She is exercising. She is adherent to low salt diet.   Outside blood pressures are running 130's-140's/60-70's. Patient denies chest pain, chest pressure/discomfort, claudication, dyspnea, exertional chest pressure/discomfort, fatigue, irregular heart beat, lower extremity edema, near-syncope, orthopnea, palpitations, paroxysmal nocturnal dyspnea, syncope and tachypnea.   Wt Readings from Last 3 Encounters:  01/19/17 157 lb (71.2 kg)  12/23/16 151 lb 3.2 oz (68.6 kg)  12/02/16 151 lb 12.8 oz (68.9 kg)     ------------------------------------------------------------------------      Allergies  Allergen Reactions  . Latex Anaphylaxis     Current Outpatient Medications:  .  glucose blood (BAYER CONTOUR NEXT TEST) test strip, Reported on 01/31/2015, Disp: , Rfl:  .  losartan-hydrochlorothiazide (HYZAAR) 100-25 MG tablet, TAKE ONE TABLET BY MOUTH ONCE DAILY, Disp: 90 tablet, Rfl: 3 .  Multiple Vitamins-Minerals (CENTRUM SILVER 50+WOMEN PO), Take by mouth daily. , Disp: , Rfl:  .  Omega-3 Fatty Acids (FISH OIL) 1200 MG CAPS, Reported on 06/19/2015, Disp: , Rfl:   Review of Systems  Constitutional: Negative.   HENT: Negative.   Eyes: Negative.   Respiratory: Negative.   Cardiovascular: Negative.   Gastrointestinal: Negative.   Endocrine: Negative.   Genitourinary: Negative.     Musculoskeletal: Negative.   Skin: Negative.   Allergic/Immunologic: Negative.   Neurological: Negative.   Hematological: Negative.   Psychiatric/Behavioral: Negative.     Social History   Tobacco Use  . Smoking status: Never Smoker  . Smokeless tobacco: Never Used  Substance Use Topics  . Alcohol use: No    Alcohol/week: 0.0 oz   Objective:   BP (!) 148/70   Pulse 82   Temp 97.7 F (36.5 C) (Oral)   Resp 16   Wt 157 lb (71.2 kg)   BMI 29.66 kg/m  Vitals:   01/19/17 1117  BP: (!) 148/70  Pulse: 82  Resp: 16  Temp: 97.7 F (36.5 C)  TempSrc: Oral  Weight: 157 lb (71.2 kg)     Physical Exam  Constitutional: She is oriented to person, place, and time. She appears well-developed and well-nourished.  HENT:  Head: Normocephalic and atraumatic.  Eyes: Conjunctivae and EOM are normal. Pupils are equal, round, and reactive to light.  Neck: Normal range of motion. Neck supple. No thyromegaly present.  Cardiovascular: Normal rate, regular rhythm, normal heart sounds and intact distal pulses.  Pulmonary/Chest: Effort normal and breath sounds normal.  Abdominal: Soft.  Musculoskeletal: Normal range of motion.  Neurological: She is alert and oriented to person, place, and time. She has normal reflexes.  Skin: Skin is warm and dry.  Psychiatric: She has a normal mood and affect. Her behavior is normal. Judgment and thought content normal.        Assessment & Plan:     1. Essential (primary) hypertension Add amlodipine follow up  in 1-2 months.  - amLODipine (NORVASC) 5 MG tablet; Take 1 tablet (5 mg total) by mouth daily.  Dispense: 90 tablet; Refill: 3 2.OA     HPI, Exam, and A&P Transcribed under the direction and in the presence of Richard L. Cranford Mon, MD  Electronically Signed: Katina Dung, CMA  I have done the exam and reviewed the above chart and it is accurate to the best of my knowledge. Development worker, community has been used in this note in any air is in  the dictation or transcription are unintentional.  [name], MD  [place]

## 2017-03-18 ENCOUNTER — Ambulatory Visit: Payer: Medicare Other | Admitting: Family Medicine

## 2017-03-18 ENCOUNTER — Encounter: Payer: Self-pay | Admitting: Family Medicine

## 2017-03-18 VITALS — BP 140/68 | HR 88 | Temp 98.5°F | Resp 16 | Wt 151.0 lb

## 2017-03-18 DIAGNOSIS — E1122 Type 2 diabetes mellitus with diabetic chronic kidney disease: Secondary | ICD-10-CM

## 2017-03-18 DIAGNOSIS — I10 Essential (primary) hypertension: Secondary | ICD-10-CM | POA: Diagnosis not present

## 2017-03-18 DIAGNOSIS — N181 Chronic kidney disease, stage 1: Secondary | ICD-10-CM

## 2017-03-18 LAB — POCT GLYCOSYLATED HEMOGLOBIN (HGB A1C)
Est. average glucose Bld gHb Est-mCnc: 137
Hemoglobin A1C: 6.4

## 2017-03-18 LAB — POCT UA - MICROALBUMIN: Microalbumin Ur, POC: 20 mg/L

## 2017-03-18 NOTE — Progress Notes (Signed)
Patient: Cynthia Castillo Female    DOB: 1940-12-29   77 y.o.   MRN: [id number] Visit Date: 03/18/2017  Today's Provider: [name], MD   Chief Complaint  Patient presents with  . Diabetes  . Hypertension   Subjective:    HPI      Hypertension, follow-up:  BP Readings from Last 3 Encounters:  03/18/17 140/68  01/19/17 (!) 148/70  12/23/16 132/82    She was last seen for hypertension 2 months ago.  BP at that visit was 148/70. Management since that visit includes adding amlodipine 5 mg po qd. She reports poor compliance with treatment. She is having side effects. Constipation. Pt stopped taking the amlodipine.  She is exercising. "Some." Mostly walking. She is adherent to low salt diet.   Outside blood pressures are different in each arm per pt. BP today in right arm was 140/68, and 128/60 in the left arm. She is experiencing fatigue.  Patient denies chest pain, chest pressure/discomfort, claudication, dyspnea, exertional chest pressure/discomfort, irregular heart beat, lower extremity edema, near-syncope, orthopnea, palpitations and syncope.   Cardiovascular risk factors include advanced age (older than 55 for men, 60 for women), diabetes mellitus and hypertension.  Use of agents associated with hypertension: none.     Weight trend: fluctuating a bit Wt Readings from Last 3 Encounters:  03/18/17 151 lb (68.5 kg)  01/19/17 157 lb (71.2 kg)  12/23/16 151 lb 3.2 oz (68.6 kg)    Current diet: in general, a "healthy" diet  Not enough vegetables per pt;  ------------------------------------------------------------------------  Diabetes Mellitus Type II, Follow-up:   Lab Results  Component Value Date   HGBA1C 6.4 03/18/2017   HGBA1C 6.5 12/02/2016   HGBA1C 6.6 07/08/2016    Last seen for diabetes 3 months ago.  Management since then includes encouraging pt to work on habits. She reports good compliance with treatment. Current symptoms include  paresthesia of the feet and have been stable. Home blood sugar records: fasting range: 130's  Episodes of hypoglycemia? no   Most Recent Eye Exam: 01/14/2017- negative.  Pertinent Labs:    Component Value Date/Time   CHOL 197 12/12/2016 0948   CHOL 206 (H) 01/07/2016 0958   TRIG 98 12/12/2016 0948   HDL 64 12/12/2016 0948   HDL 64 01/07/2016 0958   LDLCALC 124 (H) 01/07/2016 0958   CREATININE 0.85 12/12/2016 0948    ------------------------------------------------------------------------   Allergies  Allergen Reactions  . Latex Anaphylaxis     Current Outpatient Medications:  .  glucose blood (BAYER CONTOUR NEXT TEST) test strip, Reported on 01/31/2015, Disp: , Rfl:  .  losartan-hydrochlorothiazide (HYZAAR) 100-25 MG tablet, TAKE ONE TABLET BY MOUTH ONCE DAILY, Disp: 90 tablet, Rfl: 3 .  Multiple Vitamins-Minerals (CENTRUM SILVER 50+WOMEN PO), Take by mouth daily. , Disp: , Rfl:  .  Omega-3 Fatty Acids (FISH OIL) 1200 MG CAPS, Reported on 06/19/2015, Disp: , Rfl:  .  amLODipine (NORVASC) 5 MG tablet, Take 1 tablet (5 mg total) by mouth daily. (Patient not taking: Reported on 03/18/2017), Disp: 90 tablet, Rfl: 3  Review of Systems  Constitutional: Positive for fatigue. Negative for activity change, appetite change, chills, diaphoresis, fever and unexpected weight change.  HENT: Negative.   Eyes: Negative for visual disturbance.  Respiratory: Negative for shortness of breath.   Cardiovascular: Negative for chest pain, palpitations and leg swelling.  Endocrine: Negative for polydipsia, polyphagia and polyuria.  Allergic/Immunologic: Negative.   Psychiatric/Behavioral: Negative.  Social History   Tobacco Use  . Smoking status: Never Smoker  . Smokeless tobacco: Never Used  Substance Use Topics  . Alcohol use: No    Alcohol/week: 0.0 oz   Objective:   BP 140/68 (BP Location: Right Arm, Patient Position: Sitting, Cuff Size: Normal)   Pulse 88   Temp 98.5 F (36.9  C) (Oral)   Resp 16   Wt 151 lb (68.5 kg)   BMI 28.53 kg/m  Vitals:   03/18/17 1126 03/18/17 1127  BP: 128/60 140/68  Pulse: 88   Resp: 16   Temp: 98.5 F (36.9 C)   TempSrc: Oral   Weight: 151 lb (68.5 kg)      Physical Exam  Constitutional: She is oriented to person, place, and time. She appears well-developed and well-nourished.  HENT:  Head: Normocephalic and atraumatic.  Eyes: Conjunctivae are normal.  Neck: No thyromegaly present.  Cardiovascular: Normal rate.  Pulmonary/Chest: Effort normal.  Abdominal: Soft.  Neurological: She is alert and oriented to person, place, and time.  Skin: Skin is warm and dry.  Psychiatric: She has a normal mood and affect. Her behavior is normal. Judgment and thought content normal.        Assessment & Plan:     1. Essential (primary) hypertension    2. Type 2 diabetes mellitus with stage 1 chronic kidney disease, without long-term current use of insulin (HCC)  - POCT glycosylated hemoglobin (Hb A1C)--6.4 today. - POCT UA - Microalbumin--20     Patient seen and examined by Miguel Aschoff, MD, and note scribed by Martha Clan, CMA. I have done the exam and reviewed the above chart and it is accurate to the best of my knowledge. Development worker, community has been used in this note in any air is in the dictation or transcription are unintentional.  [name], MD  [place]

## 2017-03-26 ENCOUNTER — Telehealth: Payer: Self-pay | Admitting: Family Medicine

## 2017-03-26 DIAGNOSIS — N181 Chronic kidney disease, stage 1: Principal | ICD-10-CM

## 2017-03-26 DIAGNOSIS — E1122 Type 2 diabetes mellitus with diabetic chronic kidney disease: Secondary | ICD-10-CM

## 2017-03-26 MED ORDER — LANCETS MISC: 1.0000 | Freq: Every day | 11 refills | Status: DC

## 2017-03-26 MED ORDER — GLUCOSE BLOOD VI STRP: ORAL_STRIP | 11 refills | Status: DC

## 2017-03-26 NOTE — Telephone Encounter (Signed)
Sent in. Tried to call pt. No answer.

## 2017-03-26 NOTE — Telephone Encounter (Signed)
Pt contacted office for refill request on the following diabetic testing supplies:  1. Test stripes 2. [place] stated that she doesn't know the name of her meter that we gave her everything in a kit and this is the first time she has ran out. Pt stated that she requested the supplies at her LOV on 03/18/17. Please advise. Thanks TNP

## 2017-03-30 ENCOUNTER — Other Ambulatory Visit: Payer: Self-pay | Admitting: Emergency Medicine

## 2017-03-30 DIAGNOSIS — N181 Chronic kidney disease, stage 1: Principal | ICD-10-CM

## 2017-03-30 DIAGNOSIS — E1122 Type 2 diabetes mellitus with diabetic chronic kidney disease: Secondary | ICD-10-CM

## 2017-03-30 MED ORDER — GLUCOSE BLOOD VI STRP: ORAL_STRIP | 11 refills | Status: DC

## 2017-04-27 IMAGING — DX DG KNEE 1-2V*L*
2 series · 2 of 2 positions shown · non-contrast
Comparison: Left knee CT 04/05/2015.

CLINICAL DATA: Postop day 0 left total knee arthroplasty for
osteoarthritis.

EXAM:
LEFT KNEE - 1-2 VIEW

[knee ap]
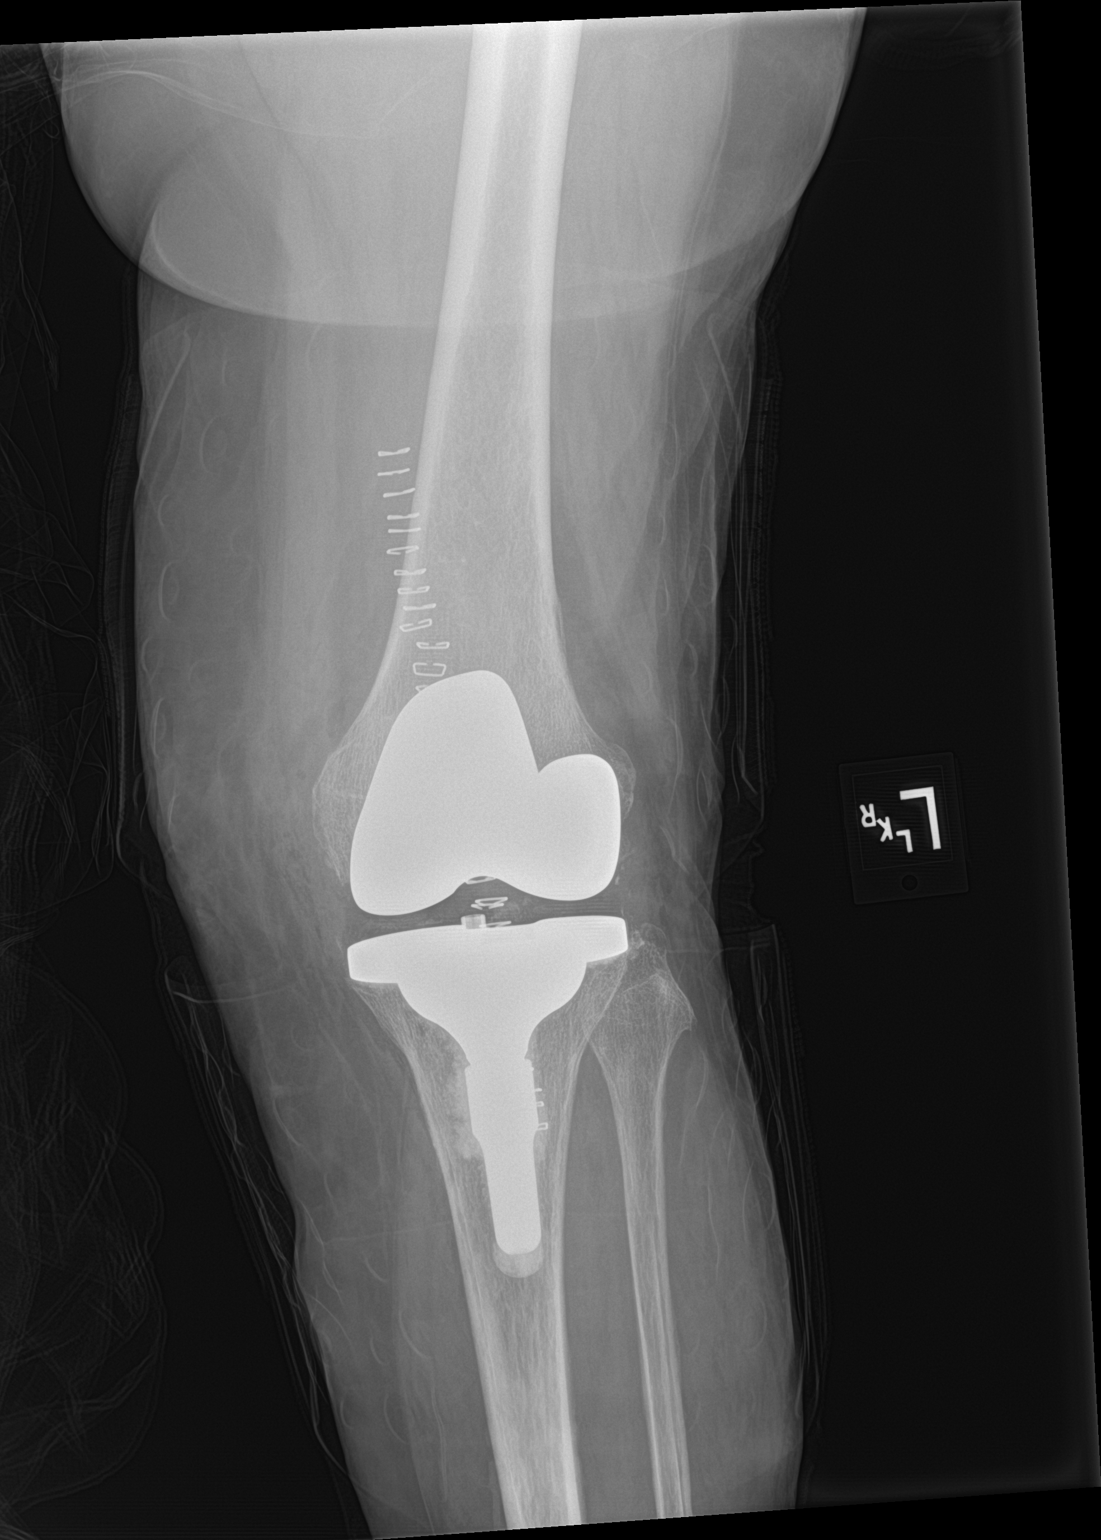

[knee lat]
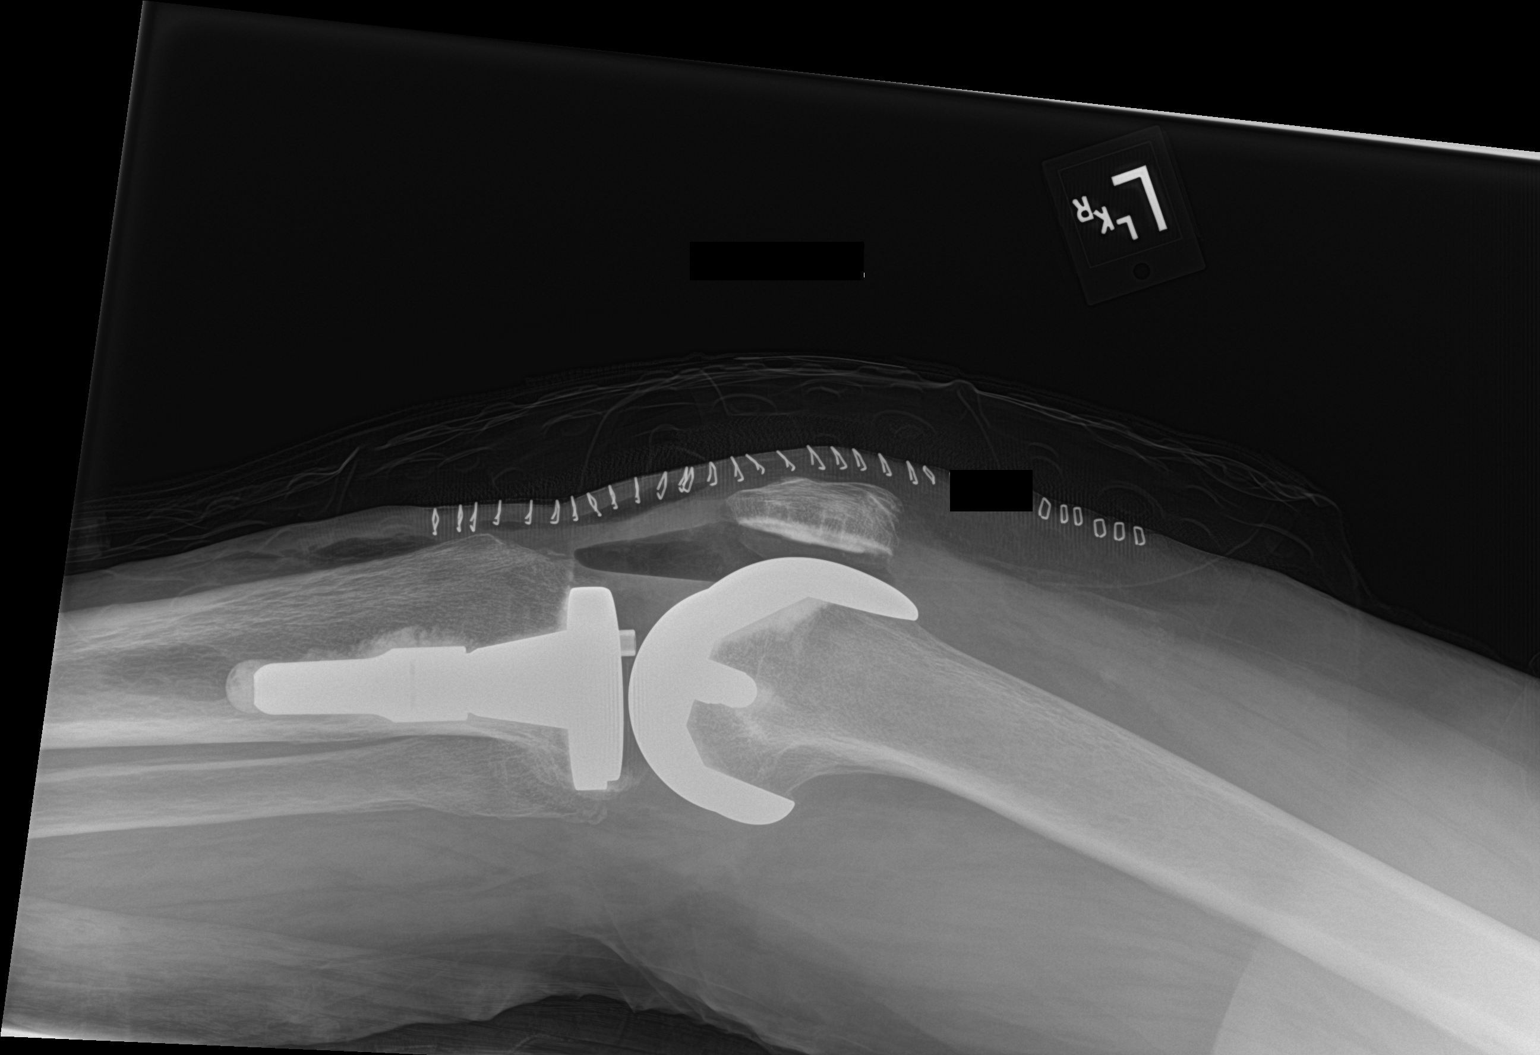

[2 of 2 positions shown; findings below may reference images not displayed]

FINDINGS: Left total knee arthroplasty with anatomic alignment. No acute
complicating features. Air-fluid level in the joint as expected.
Osseous demineralization.
IMPRESSION: Anatomic alignment post left total knee arthroplasty without acute
complicating features.

## 2017-05-25 ENCOUNTER — Ambulatory Visit: Payer: Medicare Other | Admitting: Family Medicine

## 2017-05-25 VITALS — BP 150/70 | HR 90 | Temp 98.1°F | Resp 16 | Wt 147.0 lb

## 2017-05-25 DIAGNOSIS — L84 Corns and callosities: Secondary | ICD-10-CM | POA: Diagnosis not present

## 2017-05-25 NOTE — Progress Notes (Signed)
Cynthia Castillo  MRN: [id number] DOB: 21-Aug-1940  Subjective:  HPI   The patient is a 77 year old female who presents for evaluation of what she calls a growth on her right little toe.  She said she noticed it about 2 weeks ago and it does not bother her except when she has to wear shoes.  She states that she is having to wear flip flops to prevent pain.     Patient Active Problem List   Diagnosis Date Noted  . Primary osteoarthritis of left knee 06/19/2015  . Encounter for screening colonoscopy 01/31/2015  . Arthritis 01/01/2015  . Allergic rhinitis 08/15/2014  . Arthritis of knee 08/15/2014  . Endometriosis 08/15/2014  . HLD (hyperlipidemia) 08/15/2014  . Adult hypothyroidism 08/15/2014  . Adiposity 08/15/2014  . Primary osteoarthritis of knee 07/25/2014  . Mastalgia 02/01/2014  . Diabetes mellitus, type 2 ([place]) 12/27/2007  . Avitaminosis D 12/27/2007  . Deficiency, disaccharidase intestinal 09/18/2005  . Allergic reaction 12/09/1994  . Bloodgood disease 10/19/1990  . Essential (primary) hypertension 10/19/1990    Past Medical History:  Diagnosis Date  . Arthritis    hands  . Complication of anesthesia   . Diabetes mellitus without complication ([place]) 3329   diet control  . Hypertension   . Murmur   . PONV (postoperative nausea and vomiting)     Social History   Socioeconomic History  . Marital status: Widowed    Spouse name: Not on file  . Number of children: Not on file  . Years of education: Not on file  . Highest education level: Not on file  Occupational History  . Not on file  Social Needs  . Financial resource strain: Not on file  . Food insecurity:    Worry: Not on file    Inability: Not on file  . Transportation needs:    Medical: Not on file    Non-medical: Not on file  Tobacco Use  . Smoking status: Never Smoker  . Smokeless tobacco: Never Used  Substance and Sexual Activity  . Alcohol use: No    Alcohol/week: 0.0 oz  . Drug use: No  .  Sexual activity: Never  Lifestyle  . Physical activity:    Days per week: Not on file    Minutes per session: Not on file  . Stress: Not on file  Relationships  . Social connections:    Talks on phone: Not on file    Gets together: Not on file    Attends religious service: Not on file    Active member of club or organization: Not on file    Attends meetings of clubs or organizations: Not on file    Relationship status: Not on file  . Intimate partner violence:    Fear of current or ex partner: Not on file    Emotionally abused: Not on file    Physically abused: Not on file    Forced sexual activity: Not on file  Other Topics Concern  . Not on file  Social History Narrative  . Not on file    Outpatient Encounter Medications as of 05/25/2017  Medication Sig Note  . amLODipine (NORVASC) 5 MG tablet Take 1 tablet (5 mg total) by mouth daily.   Marland Kitchen glucose blood (BAYER CONTOUR NEXT TEST) test strip Once daily,fasting E11.40   . Lancets MISC 1 each by Does not apply route daily. Dx E11.40   . losartan-hydrochlorothiazide (HYZAAR) 100-25 MG tablet TAKE ONE TABLET BY MOUTH  ONCE DAILY   . Multiple Vitamins-Minerals (CENTRUM SILVER 50+WOMEN PO) Take by mouth daily.    . Omega-3 Fatty Acids (FISH OIL) 1200 MG CAPS Reported on 06/19/2015 08/15/2014: DX: 272.0 Received from: [employer]   No facility-administered encounter medications on file as of 05/25/2017.     Allergies  Allergen Reactions  . Latex Anaphylaxis    Review of Systems  Constitutional: Negative for fever and malaise/fatigue.  Respiratory: Negative for cough, shortness of breath and wheezing.   Cardiovascular: Negative for chest pain, palpitations and leg swelling.  Skin:       Painful growth lateral to 5th toe nail. No other issues.  Neurological: Negative.   Psychiatric/Behavioral: Negative.     Objective:  BP (!) 150/70 (BP Location: Right Arm, Patient Position: Sitting, Cuff Size: Normal)   Pulse  90   Temp 98.1 F (36.7 C) (Oral)   Resp 16   Wt 147 lb (66.7 kg)   BMI 27.78 kg/m   Physical Exam  Constitutional: She is oriented to person, place, and time and well-developed, well-nourished, and in no distress.  HENT:  Head: Normocephalic and atraumatic.  Eyes: Conjunctivae are normal. No scleral icterus.  Cardiovascular: Normal rate and intact distal pulses.  Pulmonary/Chest: Effort normal.  Abdominal: Soft.  Neurological: She is alert and oriented to person, place, and time. Gait normal. GCS score is 15.  Skin: Skin is warm and dry.  Small,firm growth just lateral to ,possibly attached to 5th toe. No findings of any infection.  Psychiatric: Mood, memory, affect and judgment normal.    Assessment and Plan :  Corn of 5th toe Refer to podiatry.  I have done the exam and reviewed the chart and it is accurate to the best of my knowledge. Development worker, community has been used and  any errors in dictation or transcription are unintentional. Miguel Aschoff M.D. [facility]

## 2017-07-14 ENCOUNTER — Ambulatory Visit: Payer: Medicare Other | Admitting: Family Medicine

## 2017-07-14 ENCOUNTER — Encounter: Payer: Self-pay | Admitting: Family Medicine

## 2017-07-14 VITALS — BP 138/64 | HR 76 | Temp 98.6°F | Resp 16 | Ht 61.0 in | Wt 146.0 lb

## 2017-07-14 DIAGNOSIS — E1122 Type 2 diabetes mellitus with diabetic chronic kidney disease: Secondary | ICD-10-CM

## 2017-07-14 DIAGNOSIS — N181 Chronic kidney disease, stage 1: Secondary | ICD-10-CM

## 2017-07-14 DIAGNOSIS — E7849 Other hyperlipidemia: Secondary | ICD-10-CM

## 2017-07-14 LAB — POCT GLYCOSYLATED HEMOGLOBIN (HGB A1C): Hemoglobin A1C: 6 % — AB (ref 4.0–5.6)

## 2017-07-14 MED ORDER — ROSUVASTATIN CALCIUM 5 MG PO TABS: 5.0000 mg | ORAL_TABLET | Freq: Every day | ORAL | 5 refills | Status: DC

## 2017-07-14 NOTE — Progress Notes (Signed)
Patient: Cynthia Castillo Female    DOB: [birth year]   77 y.o.   MRN: [id number] Visit Date: 07/14/2017  Today's Provider: [name], MD   Chief Complaint  Patient presents with  . Hypertension  . Diabetes   Subjective:    HPI     Hypertension, follow-up:  BP Readings from Last 3 Encounters:  07/14/17 138/64  05/25/17 (!) 150/70  03/18/17 140/68    She was last seen for hypertension 4 months ago.  BP at that visit was 140/68. Management since that visit includes no changes. She reports good compliance with treatment. She is not having side effects.  She is not exercising. She is adherent to low salt diet.   Outside blood pressures are checked occasionally.  Weight trend: stable Wt Readings from Last 3 Encounters:  07/14/17 146 lb (66.2 kg)  05/25/17 147 lb (66.7 kg)  03/18/17 151 lb (68.5 kg)    Current diet: well balanced, vegetarian   Diabetes Mellitus Type II, Follow-up:   Lab Results  Component Value Date   HGBA1C 6.4 03/18/2017   HGBA1C 6.5 12/02/2016   HGBA1C 6.6 07/08/2016    Last seen for diabetes 4 months ago.  Management since then includes no changes. She reports good compliance with treatment. She is not having side effects. Pertinent Labs:    Component Value Date/Time   CHOL 197 12/12/2016 0948   CHOL 206 (H) 01/07/2016 0958   TRIG 98 12/12/2016 0948   HDL 64 12/12/2016 0948   HDL 64 01/07/2016 0958   LDLCALC 113 (H) 12/12/2016 0948   CREATININE 0.85 12/12/2016 0948    Wt Readings from Last 3 Encounters:  07/14/17 146 lb (66.2 kg)  05/25/17 147 lb (66.7 kg)  03/18/17 151 lb (68.5 kg)        Allergies  Allergen Reactions  . Latex Anaphylaxis     Current Outpatient Medications:  .  amLODipine (NORVASC) 5 MG tablet, Take 1 tablet (5 mg total) by mouth daily., Disp: 90 tablet, Rfl: 3 .  glucose blood (BAYER CONTOUR NEXT TEST) test strip, Once daily,fasting E11.40, Disp: 100 each, Rfl: 11 .  Lancets MISC, 1  each by Does not apply route daily. Dx E11.40, Disp: 100 each, Rfl: 11 .  losartan-hydrochlorothiazide (HYZAAR) 100-25 MG tablet, TAKE ONE TABLET BY MOUTH ONCE DAILY, Disp: 90 tablet, Rfl: 3 .  Multiple Vitamins-Minerals (CENTRUM SILVER 50+WOMEN PO), Take by mouth daily. , Disp: , Rfl:  .  Omega-3 Fatty Acids (FISH OIL) 1200 MG CAPS, Reported on 06/19/2015, Disp: , Rfl:   Review of Systems  Constitutional: Negative.   HENT: Negative.   Eyes: Negative.   Respiratory: Negative.   Cardiovascular: Negative.   Endocrine: Negative.   Musculoskeletal: Positive for arthralgias.  Allergic/Immunologic: Negative.   Hematological: Negative.   Psychiatric/Behavioral: Negative.     Social History   Tobacco Use  . Smoking status: Never Smoker  . Smokeless tobacco: Never Used  Substance Use Topics  . Alcohol use: No    Alcohol/week: 0.0 oz   Objective:   BP 138/64 (BP Location: Left Arm, Patient Position: Sitting, Cuff Size: Normal)   Pulse 76   Temp 98.6 F (37 C)   Resp 16   Ht 5\' 1"  (1.549 m)   Wt 146 lb (66.2 kg)   SpO2 100%   BMI 27.59 kg/m  Vitals:   07/14/17 1117  BP: 138/64  Pulse: 76  Resp: 16  Temp: 98.6 F (37  C)  SpO2: 100%  Weight: 146 lb (66.2 kg)  Height: 5\' 1"  (1.549 m)     Physical Exam  Constitutional: She is oriented to person, place, and time. She appears well-developed and well-nourished.  HENT:  Head: Normocephalic and atraumatic.  Eyes: Conjunctivae are normal. No scleral icterus.  Neck: No thyromegaly present.  Cardiovascular: Normal rate, regular rhythm and normal heart sounds.  Pulmonary/Chest: Effort normal and breath sounds normal.  Abdominal: Soft.  Musculoskeletal: She exhibits no edema.  Neurological: She is alert and oriented to person, place, and time.  Skin: Skin is warm and dry.  Psychiatric: She has a normal mood and affect. Her behavior is normal. Judgment and thought content normal.        Assessment & Plan:     1. Type 2  diabetes mellitus with stage 1 chronic kidney disease, without long-term current use of insulin (HCC)  - POCT glycosylated hemoglobin (Hb A1C)  2. Other hyperlipidemia  - rosuvastatin (CRESTOR) 5 MG tablet; Take 1 tablet (5 mg total) by mouth daily.  Dispense: 30 tablet; Refill: 5 3.HTN 4.OA     I have done the exam and reviewed the above chart and it is accurate to the best of my knowledge. Development worker, community has been used in this note in any air is in the dictation or transcription are unintentional.  [name], MD  [place]

## 2017-09-09 ENCOUNTER — Other Ambulatory Visit: Payer: Self-pay | Admitting: Family Medicine

## 2017-09-09 DIAGNOSIS — Z1231 Encounter for screening mammogram for malignant neoplasm of breast: Secondary | ICD-10-CM

## 2017-09-09 DIAGNOSIS — I10 Essential (primary) hypertension: Secondary | ICD-10-CM

## 2017-11-18 ENCOUNTER — Ambulatory Visit: Payer: Self-pay | Admitting: Family Medicine

## 2017-12-30 ENCOUNTER — Ambulatory Visit (INDEPENDENT_AMBULATORY_CARE_PROVIDER_SITE_OTHER): Payer: Medicare Other

## 2017-12-30 VITALS — BP 160/68 | HR 84 | Temp 98.4°F | Ht 61.0 in | Wt 158.0 lb

## 2017-12-30 DIAGNOSIS — N181 Chronic kidney disease, stage 1: Secondary | ICD-10-CM | POA: Diagnosis not present

## 2017-12-30 DIAGNOSIS — Z0000 Encounter for general adult medical examination without abnormal findings: Secondary | ICD-10-CM

## 2017-12-30 DIAGNOSIS — Z23 Encounter for immunization: Secondary | ICD-10-CM

## 2017-12-30 DIAGNOSIS — E1122 Type 2 diabetes mellitus with diabetic chronic kidney disease: Secondary | ICD-10-CM

## 2017-12-30 NOTE — Progress Notes (Signed)
Subjective:   Cynthia Castillo is a 77 y.o. female who presents for Medicare Annual (Subsequent) preventive examination.  Review of Systems:  N/A  Cardiac Risk Factors include: advanced age (>64men, >30 women);diabetes mellitus;dyslipidemia;hypertension     Objective:     Vitals: BP (!) 160/68 (BP Location: Right Arm)   Pulse 84   Temp 98.4 F (36.9 C) (Oral)   Ht 5\' 1"  (1.549 m)   Wt 158 lb (71.7 kg)   BMI 29.85 kg/m   Body mass index is 29.85 kg/m.  Advanced Directives 12/30/2017 12/23/2016 12/05/2015 06/19/2015 06/06/2015 08/29/2014 07/25/2014  Does Patient Have a Medical Advance Directive? No No Yes Yes Yes No No  Type of Advance Directive - - Living will Living will Living will - -  Copy of [place] in Chart? - - No - copy requested - No - copy requested - -  Would patient like information on creating a medical advance directive? No - Patient declined Yes (MAU/Ambulatory/Procedural Areas - Information given) - - - - Yes - Spiritual care consult ordered    Tobacco Social History   Tobacco Use  Smoking Status Never Smoker  Smokeless Tobacco Never Used     Counseling given: Not Answered   Clinical Intake:  Pre-visit preparation completed: Yes  Pain : No/denies pain Pain Score: 0-No pain    Diabetes:  Is the patient diabetic?  Yes  If diabetic, was a CBG obtained today?  No  Did the patient bring in their glucometer from home?  No  How often do you monitor your CBG's? Daily.   Financial Strains and Diabetes Management:  Are you having any financial strains with the device, your supplies or your medication? No .  Does the patient want to be seen by Chronic Care Management for management of their diabetes?  No  Would the patient like to be referred to a Nutritionist or for Diabetic Management?  Yes , referral sent today.   Diabetic Exams:  Diabetic Eye Exam: Completed 01/14/17. Next apt scheduled for 02/23/18.  Diabetic Foot Exam: Completed  12/02/16. Pt has been advised about the importance in completing this exam. Note made to have this updated at next OV.     Nutritional Status: BMI 25 -29 Overweight Nutritional Risks: None   How often do you need to have someone help you when you read instructions, pamphlets, or other written materials from your doctor or pharmacy?: 1 - Never  Interpreter Needed?: No  Information entered by :: [facility], LPN  Past Medical History:  Diagnosis Date  . Arthritis    hands  . Complication of anesthesia   . Diabetes mellitus without complication ([place]) 1751   diet control  . Hypertension   . Murmur   . PONV (postoperative nausea and vomiting)    Past Surgical History:  Procedure Laterality Date  . ABDOMINAL HYSTERECTOMY  1972  . APPENDECTOMY    . BREAST CYST EXCISION Left 1972  . COLONOSCOPY  2007   Dr Bary Castilla  . COLONOSCOPY WITH PROPOFOL N/A 03/21/2015   Procedure: COLONOSCOPY WITH PROPOFOL;  Surgeon: Robert Bellow, MD;  Location: [facility] ENDOSCOPY;  Service: Endoscopy;  Laterality: N/A;  . REPLACEMENT TOTAL KNEE BILATERAL    . TOTAL KNEE ARTHROPLASTY Right 07/25/2014   Procedure: TOTAL KNEE ARTHROPLASTY;  Surgeon: Hessie Knows, MD;  Location: [facility] ORS;  Service: Orthopedics;  Laterality: Right;  . TOTAL KNEE ARTHROPLASTY Left 06/19/2015   Procedure: TOTAL KNEE ARTHROPLASTY;  Surgeon: Hessie Knows, MD;  Location: [facility] ORS;  Service: Orthopedics;  Laterality: Left;   Family History  Problem Relation Age of Onset  . Stroke Mother   . Heart attack Father   . Cancer Sister 71       stomach/[place]  . Cancer Sister 90       breast/Rosa Graves  . Breast cancer Sister   . Cancer Maternal Grandmother        unknown   Social History   Socioeconomic History  . Marital status: Widowed    Spouse name: Not on file  . Number of children: 1  . Years of education: Not on file  . Highest education level: Some college, no degree  Occupational History  . Occupation: retired  Photographer  . Financial resource strain: Not hard at all  . Food insecurity:    Worry: Never true    Inability: Never true  . Transportation needs:    Medical: No    Non-medical: No  Tobacco Use  . Smoking status: Never Smoker  . Smokeless tobacco: Never Used  Substance and Sexual Activity  . Alcohol use: No    Alcohol/week: 0.0 standard drinks  . Drug use: No  . Sexual activity: Never  Lifestyle  . Physical activity:    Days per week: 0 days    Minutes per session: 0 min  . Stress: Not at all  Relationships  . Social connections:    Talks on phone: Patient refused    Gets together: Patient refused    Attends religious service: Patient refused    Active member of club or organization: Patient refused    Attends meetings of clubs or organizations: Patient refused    Relationship status: Patient refused  Other Topics Concern  . Not on file  Social History Narrative  . Not on file    Outpatient Encounter Medications as of 12/30/2017  Medication Sig  . amLODipine (NORVASC) 5 MG tablet Take 1 tablet (5 mg total) by mouth daily.  Marland Kitchen glucose blood (BAYER CONTOUR NEXT TEST) test strip Once daily,fasting E11.40  . Lancets MISC 1 each by Does not apply route daily. Dx E11.40  . losartan-hydrochlorothiazide (HYZAAR) 100-25 MG tablet TAKE 1 TABLET BY MOUTH ONCE DAILY  . Multiple Vitamins-Minerals (CENTRUM SILVER 50+WOMEN PO) Take by mouth daily.   . Omega-3 Fatty Acids (FISH OIL) 1200 MG CAPS Reported on 06/19/2015  . rosuvastatin (CRESTOR) 5 MG tablet Take 1 tablet (5 mg total) by mouth daily.   No facility-administered encounter medications on file as of 12/30/2017.     Activities of Daily Living In your present state of health, do you have any difficulty performing the following activities: 12/30/2017  Hearing? N  Vision? N  Difficulty concentrating or making decisions? N  Walking or climbing stairs? Y  Comment Due to occasional knee pains.   Dressing or bathing? N  Doing  errands, shopping? N  Preparing Food and eating ? N  Using the Toilet? N  In the past six months, have you accidently leaked urine? N  Do you have problems with loss of bowel control? N  Managing your Medications? N  Managing your Finances? N  Housekeeping or managing your Housekeeping? N  Some recent data might be hidden    Patient Care Team: Jerrol Banana., MD as PCP - General (Family Medicine) Bary Castilla, Forest Gleason, MD (General Surgery) Leandrew Koyanagi, MD as Referring Physician (Ophthalmology)    Assessment:   This is a routine wellness examination for  Cynthia Castillo.  Exercise Activities and Dietary recommendations Current Exercise Habits: The patient does not participate in regular exercise at present, Exercise limited by: None identified  Goals      Patient Stated   . DIET - INCREASE WATER INTAKE (pt-stated)     Recommend increasing water intake to 4-6 glasses a day.       Other   . Increase water intake     Starting 12/05/15, I will increase my water intake to 4 glasses a day.       Fall Risk Fall Risk  12/30/2017 12/23/2016 12/05/2015 10/23/2014  Falls in the past year? 0 Yes No No  Number falls in past yr: - 1 - -  Injury with Fall? - Yes - -  Comment - had to have both knees replaced following accident (fell out of bed) - -  Follow up - Falls prevention discussed - -   FALL RISK PREVENTION PERTAINING TO THE HOME:  Any stairs in or around the home WITH handrails? No  Home free of loose throw rugs in walkways, pet beds, electrical cords, etc? Yes  Adequate lighting in your home to reduce risk of falls? Yes   ASSISTIVE DEVICES UTILIZED TO PREVENT FALLS:  Life alert? Yes  Use of a cane, walker or w/c? Yes  Grab bars in the bathroom? Yes  Shower chair or bench in shower? Yes  Elevated toilet seat or a handicapped toilet? No    TIMED UP AND GO:  Was the test performed? Yes .     Depression Screen PHQ 2/9 Scores 12/30/2017 01/19/2017 12/23/2016  12/23/2016  PHQ - 2 Score 0 0 1 1  PHQ- 9 Score - 4 5 -     Cognitive Function     6CIT Screen 12/30/2017 12/05/2015  What Year? 0 points 0 points  What month? 0 points 0 points  What time? 0 points 0 points  Count back from 20 0 points 0 points  Months in reverse 2 points 2 points  Repeat phrase 0 points 4 points  Total Score 2 6    Immunization History  Administered Date(s) Administered  . Influenza, High Dose Seasonal PF 10/23/2014, 09/17/2015, 12/23/2016, 12/30/2017  . Pneumococcal Conjugate-13 08/16/2013  . Pneumococcal Polysaccharide-23 11/20/2010  . Td 08/25/2003  . Tdap 10/20/2012  . Zoster 10/20/2012    Qualifies for Shingles Vaccine? Yes  Zostavax completed 10/20/12. Due for Shingrix. Education has been provided regarding the importance of this vaccine. Pt has been advised to call insurance company to determine out of pocket expense. Advised may also receive vaccine at local pharmacy or Health Dept. Verbalized acceptance and understanding.  Tdap: Up to date  Flu Vaccine: Due for Flu vaccine. Does the patient want to receive this vaccine today?  Yes .   Pneumococcal Vaccine: Up to date   Screening Tests Health Maintenance  Topic Date Due  . FOOT EXAM  12/02/2017  . HEMOGLOBIN A1C  01/13/2018  . OPHTHALMOLOGY EXAM  01/14/2018  . TETANUS/TDAP  10/21/2022  . INFLUENZA VACCINE  Completed  . DEXA SCAN  Completed  . PNA vac Low Risk Adult  Completed    Cancer Screenings:  Colorectal Screening: No longer required.   Mammogram: No longer required.   Bone Density: Completed 05/22/10. Results reflect NORMAL. Repeat every 10 years.   Lung Cancer Screening: (Low Dose CT Chest recommended if Age 34-80 years, 30 pack-year currently smoking OR have quit w/in 15years.) does not qualify.    Additional Screening:  Vision Screening: Recommended annual ophthalmology exams for early detection of glaucoma and other disorders of the eye.  Dental Screening:  Recommended annual dental exams for proper oral hygiene  Community Resource Referral:  CRR required this visit?  No       Plan:  I have personally reviewed and addressed the Medicare Annual Wellness questionnaire and have noted the following in the patient's chart:  A. Medical and social history B. Use of alcohol, tobacco or illicit drugs  C. Current medications and supplements D. Functional ability and status E.  Nutritional status F.  Physical activity G. Advance directives H. List of other physicians I.  Hospitalizations, surgeries, and ER visits in previous 12 months J.  [place] such as hearing and vision if needed, cognitive and depression L. Referrals and appointments - none  In addition, I have reviewed and discussed with patient certain preventive protocols, quality metrics, and best practice recommendations. A written personalized care plan for preventive services as well as general preventive health recommendations were provided to patient.  See attached scanned questionnaire for additional information.   Signed,  Fabio Neighbors, LPN Nurse Health Advisor   Nurse Recommendations: Pt needs a diabetic foot exam at next OV.

## 2017-12-30 NOTE — Patient Instructions (Signed)
Cynthia Castillo , Thank you for taking time to come for your Medicare Wellness Visit. I appreciate your ongoing commitment to your health goals. Please review the following plan we discussed and let me know if I can assist you in the future.   Screening recommendations/referrals: Colonoscopy: No longer required.  Mammogram: No longer required.  Bone Density: Up to date, due 05/2020 Recommended yearly ophthalmology/optometry visit for glaucoma screening and checkup Recommended yearly dental visit for hygiene and checkup  Vaccinations: Influenza vaccine: Administered today. Pneumococcal vaccine: Completed series Tdap vaccine: Up to date, due 10/2022 Shingles vaccine: Pt declines today.     Advanced directives: Advance directive discussed with you today. Even though you declined this today please call our office should you change your mind and we can give you the proper paperwork for you to fill out.  Conditions/risks identified: Continue trying to increase water intake to 6-8 glasses a day.   Next appointment: 02/24/18 @ 10:20 AM with Dr Rosanna Randy   Preventive Care 18 Years and Older, Female Preventive care refers to lifestyle choices and visits with your health care provider that can promote health and wellness. What does preventive care include?  A yearly physical exam. This is also called an annual well check.  Dental exams once or twice a year.  Routine eye exams. Ask your health care provider how often you should have your eyes checked.  Personal lifestyle choices, including:  Daily care of your teeth and gums.  Regular physical activity.  Eating a healthy diet.  Avoiding tobacco and drug use.  Limiting alcohol use.  Practicing safe sex.  Taking low-dose aspirin every day.  Taking vitamin and mineral supplements as recommended by your health care provider. What happens during an annual well check? The services and screenings done by your health care provider during  your annual well check will depend on your age, overall health, lifestyle risk factors, and family history of disease. Counseling  Your health care provider may ask you questions about your:  Alcohol use.  Tobacco use.  Drug use.  Emotional well-being.  Home and relationship well-being.  Sexual activity.  Eating habits.  History of falls.  Memory and ability to understand (cognition).  Work and work Statistician.  Reproductive health. Screening  You may have the following tests or measurements:  Height, weight, and BMI.  Blood pressure.  Lipid and cholesterol levels. These may be checked every 5 years, or more frequently if you are over 40 years old.  Skin check.  Lung cancer screening. You may have this screening every year starting at age 42 if you have a 30-pack-year history of smoking and currently smoke or have quit within the past 15 years.  Fecal occult blood test (FOBT) of the stool. You may have this test every year starting at age 68.  Flexible sigmoidoscopy or colonoscopy. You may have a sigmoidoscopy every 5 years or a colonoscopy every 10 years starting at age 43.  Hepatitis C blood test.  Hepatitis B blood test.  Sexually transmitted disease (STD) testing.  Diabetes screening. This is done by checking your blood sugar (glucose) after you have not eaten for a while (fasting). You may have this done every 1-3 years.  Bone density scan. This is done to screen for osteoporosis. You may have this done starting at age 39.  Mammogram. This may be done every 1-2 years. Talk to your health care provider about how often you should have regular mammograms. Talk with your health care provider  about your test results, treatment options, and if necessary, the need for more tests. Vaccines  Your health care provider may recommend certain vaccines, such as:  Influenza vaccine. This is recommended every year.  Tetanus, diphtheria, and acellular pertussis (Tdap,  Td) vaccine. You may need a Td booster every 10 years.  Zoster vaccine. You may need this after age 7.  Pneumococcal 13-valent conjugate (PCV13) vaccine. One dose is recommended after age 65.  Pneumococcal polysaccharide (PPSV23) vaccine. One dose is recommended after age 69. Talk to your health care provider about which screenings and vaccines you need and how often you need them. This information is not intended to replace advice given to you by your health care provider. Make sure you discuss any questions you have with your health care provider. Document Released: 02/02/2015 Document Revised: 09/26/2015 Document Reviewed: 11/07/2014 Elsevier Interactive Patient Education  2017 [place] Prevention in the Home Falls can cause injuries. They can happen to people of all ages. There are many things you can do to make your home safe and to help prevent falls. What can I do on the outside of my home?  Regularly fix the edges of walkways and driveways and fix any cracks.  Remove anything that might make you trip as you walk through a door, such as a raised step or threshold.  Trim any bushes or trees on the path to your home.  Use bright outdoor lighting.  Clear any walking paths of anything that might make someone trip, such as rocks or tools.  Regularly check to see if handrails are loose or broken. Make sure that both sides of any steps have handrails.  Any raised decks and porches should have guardrails on the edges.  Have any leaves, snow, or ice cleared regularly.  Use sand or salt on walking paths during winter.  Clean up any spills in your garage right away. This includes oil or grease spills. What can I do in the bathroom?  Use night lights.  Install grab bars by the toilet and in the tub and shower. Do not use towel bars as grab bars.  Use non-skid mats or decals in the tub or shower.  If you need to sit down in the shower, use a plastic, non-slip  stool.  Keep the floor dry. Clean up any water that spills on the floor as soon as it happens.  Remove soap buildup in the tub or shower regularly.  Attach bath mats securely with double-sided non-slip rug tape.  Do not have throw rugs and other things on the floor that can make you trip. What can I do in the bedroom?  Use night lights.  Make sure that you have a light by your bed that is easy to reach.  Do not use any sheets or blankets that are too big for your bed. They should not hang down onto the floor.  Have a firm chair that has side arms. You can use this for support while you get dressed.  Do not have throw rugs and other things on the floor that can make you trip. What can I do in the kitchen?  Clean up any spills right away.  Avoid walking on wet floors.  Keep items that you use a lot in easy-to-reach places.  If you need to reach something above you, use a strong step stool that has a grab bar.  Keep electrical cords out of the way.  Do not use floor polish or  wax that makes floors slippery. If you must use wax, use non-skid floor wax.  Do not have throw rugs and other things on the floor that can make you trip. What can I do with my stairs?  Do not leave any items on the stairs.  Make sure that there are handrails on both sides of the stairs and use them. Fix handrails that are broken or loose. Make sure that handrails are as long as the stairways.  Check any carpeting to make sure that it is firmly attached to the stairs. Fix any carpet that is loose or worn.  Avoid having throw rugs at the top or bottom of the stairs. If you do have throw rugs, attach them to the floor with carpet tape.  Make sure that you have a light switch at the top of the stairs and the bottom of the stairs. If you do not have them, ask someone to add them for you. What else can I do to help prevent falls?  Wear shoes that:  Do not have high heels.  Have rubber bottoms.  Are  comfortable and fit you well.  Are closed at the toe. Do not wear sandals.  If you use a stepladder:  Make sure that it is fully opened. Do not climb a closed stepladder.  Make sure that both sides of the stepladder are locked into place.  Ask someone to hold it for you, if possible.  Clearly mark and make sure that you can see:  Any grab bars or handrails.  First and last steps.  Where the edge of each step is.  Use tools that help you move around (mobility aids) if they are needed. These include:  Canes.  Walkers.  Scooters.  Crutches.  Turn on the lights when you go into a dark area. Replace any light bulbs as soon as they burn out.  Set up your furniture so you have a clear path. Avoid moving your furniture around.  If any of your floors are uneven, fix them.  If there are any pets around you, be aware of where they are.  Review your medicines with your doctor. Some medicines can make you feel dizzy. This can increase your chance of falling. Ask your doctor what other things that you can do to help prevent falls. This information is not intended to replace advice given to you by your health care provider. Make sure you discuss any questions you have with your health care provider. Document Released: 11/02/2008 Document Revised: 06/14/2015 Document Reviewed: 02/10/2014 Elsevier Interactive Patient Education  2017 [employer].

## 2018-02-24 ENCOUNTER — Ambulatory Visit (INDEPENDENT_AMBULATORY_CARE_PROVIDER_SITE_OTHER): Payer: Medicare HMO | Admitting: Family Medicine

## 2018-02-24 ENCOUNTER — Encounter: Payer: Self-pay | Admitting: Family Medicine

## 2018-02-24 VITALS — BP 118/68 | HR 76 | Temp 98.1°F | Wt 163.2 lb

## 2018-02-24 DIAGNOSIS — I10 Essential (primary) hypertension: Secondary | ICD-10-CM | POA: Diagnosis not present

## 2018-02-24 DIAGNOSIS — E1122 Type 2 diabetes mellitus with diabetic chronic kidney disease: Secondary | ICD-10-CM | POA: Diagnosis not present

## 2018-02-24 DIAGNOSIS — E785 Hyperlipidemia, unspecified: Secondary | ICD-10-CM

## 2018-02-24 DIAGNOSIS — E039 Hypothyroidism, unspecified: Secondary | ICD-10-CM | POA: Diagnosis not present

## 2018-02-24 DIAGNOSIS — N181 Chronic kidney disease, stage 1: Secondary | ICD-10-CM

## 2018-02-24 DIAGNOSIS — Z0000 Encounter for general adult medical examination without abnormal findings: Secondary | ICD-10-CM | POA: Diagnosis not present

## 2018-02-24 LAB — POCT GLYCOSYLATED HEMOGLOBIN (HGB A1C)
Est. average glucose Bld gHb Est-mCnc: 137
Hemoglobin A1C: 6.4 % — AB (ref 4.0–5.6)

## 2018-02-24 NOTE — Progress Notes (Addendum)
Patient: Cynthia Castillo, Female    DOB: [birth year], 78 y.o.   MRN: [id number] Visit Date: 02/24/2018  Today's Provider: [name], MD   Chief Complaint  Patient presents with  . Annual Exam   Subjective:   Patient had a AWE with [facility] on 12/30/2017   Annual physical exam Cynthia Castillo is a 78 y.o. female who presents today for health maintenance and complete physical. She feels well. She reports exercising walking. She reports she is sleeping well. Overall patient is feeling fairly well.  Walks with a cane now due to orthopedic issues.  -----------------------------------------------------------------   Review of Systems  Constitutional: Negative.   HENT: Negative.   Eyes: Positive for itching.  Respiratory: Negative.   Cardiovascular: Negative.   Gastrointestinal: Negative.   Endocrine: Negative.   Genitourinary: Negative.   Musculoskeletal: Negative.   Skin: Negative.   Allergic/Immunologic: Negative.   Neurological: Negative.   Hematological: Negative.   Psychiatric/Behavioral: Negative.     Social History      She  reports that she has never smoked. She has never used smokeless tobacco. She reports that she does not drink alcohol or use drugs.       Social History   Socioeconomic History  . Marital status: Widowed    Spouse name: Not on file  . Number of children: 1  . Years of education: Not on file  . Highest education level: Some college, no degree  Occupational History  . Occupation: retired  Scientific laboratory technician  . Financial resource strain: Not hard at all  . Food insecurity:    Worry: Never true    Inability: Never true  . Transportation needs:    Medical: No    Non-medical: No  Tobacco Use  . Smoking status: Never Smoker  . Smokeless tobacco: Never Used  Substance and Sexual Activity  . Alcohol use: No    Alcohol/week: 0.0 standard drinks  . Drug use: No  . Sexual activity: Never  Lifestyle  . Physical activity:    Days per  week: 0 days    Minutes per session: 0 min  . Stress: Not at all  Relationships  . Social connections:    Talks on phone: Patient refused    Gets together: Patient refused    Attends religious service: Patient refused    Active member of club or organization: Patient refused    Attends meetings of clubs or organizations: Patient refused    Relationship status: Patient refused  Other Topics Concern  . Not on file  Social History Narrative  . Not on file    Past Medical History:  Diagnosis Date  . Arthritis    hands  . Complication of anesthesia   . Diabetes mellitus without complication ([place]) 4650   diet control  . Hypertension   . Murmur   . PONV (postoperative nausea and vomiting)      Patient Active Problem List   Diagnosis Date Noted  . Primary osteoarthritis of left knee 06/19/2015  . Encounter for screening colonoscopy 01/31/2015  . Arthritis 01/01/2015  . Allergic rhinitis 08/15/2014  . Arthritis of knee 08/15/2014  . Endometriosis 08/15/2014  . HLD (hyperlipidemia) 08/15/2014  . Adult hypothyroidism 08/15/2014  . Adiposity 08/15/2014  . Primary osteoarthritis of knee 07/25/2014  . Mastalgia 02/01/2014  . Diabetes mellitus, type 2 ([place]) 12/27/2007  . Avitaminosis D 12/27/2007  . Deficiency, disaccharidase intestinal 09/18/2005  . Allergic reaction 12/09/1994  . Bloodgood disease  10/19/1990  . Essential (primary) hypertension 10/19/1990    Past Surgical History:  Procedure Laterality Date  . ABDOMINAL HYSTERECTOMY  1972  . APPENDECTOMY    . BREAST CYST EXCISION Left 1972  . COLONOSCOPY  2007   Dr Bary Castilla  . COLONOSCOPY WITH PROPOFOL N/A 03/21/2015   Procedure: COLONOSCOPY WITH PROPOFOL;  Surgeon: Robert Bellow, MD;  Location: [facility] ENDOSCOPY;  Service: Endoscopy;  Laterality: N/A;  . REPLACEMENT TOTAL KNEE BILATERAL    . TOTAL KNEE ARTHROPLASTY Right 07/25/2014   Procedure: TOTAL KNEE ARTHROPLASTY;  Surgeon: Hessie Knows, MD;  Location: [facility] ORS;   Service: Orthopedics;  Laterality: Right;  . TOTAL KNEE ARTHROPLASTY Left 06/19/2015   Procedure: TOTAL KNEE ARTHROPLASTY;  Surgeon: Hessie Knows, MD;  Location: [facility] ORS;  Service: Orthopedics;  Laterality: Left;    Family History        Family Status  Relation Name Status  . Mother  Deceased  . Father  Deceased  . Sister  Alive  . Sister  Alive  . MGM  (Not Specified)        Her family history includes Breast cancer in her sister; Cancer in her maternal grandmother; Cancer (age of onset: 63) in her sister; Cancer (age of onset: 34) in her sister; Heart attack in her father; Stroke in her mother.      Allergies  Allergen Reactions  . Latex Anaphylaxis     Current Outpatient Medications:  .  amLODipine (NORVASC) 5 MG tablet, Take 1 tablet (5 mg total) by mouth daily., Disp: 90 tablet, Rfl: 3 .  glucose blood (BAYER CONTOUR NEXT TEST) test strip, Once daily,fasting E11.40, Disp: 100 each, Rfl: 11 .  Lancets MISC, 1 each by Does not apply route daily. Dx E11.40, Disp: 100 each, Rfl: 11 .  losartan-hydrochlorothiazide (HYZAAR) 100-25 MG tablet, TAKE 1 TABLET BY MOUTH ONCE DAILY, Disp: 90 tablet, Rfl: 3 .  Multiple Vitamins-Minerals (CENTRUM SILVER 50+WOMEN PO), Take by mouth daily. , Disp: , Rfl:  .  Omega-3 Fatty Acids (FISH OIL) 1200 MG CAPS, Reported on 06/19/2015, Disp: , Rfl:  .  rosuvastatin (CRESTOR) 5 MG tablet, Take 1 tablet (5 mg total) by mouth daily., Disp: 30 tablet, Rfl: 5   Patient Care Team: Jerrol Banana., MD as PCP - General (Family Medicine) Bary Castilla, Forest Gleason, MD (General Surgery) Leandrew Koyanagi, MD as Referring Physician (Ophthalmology)    Objective:    Vitals: BP 118/68 (BP Location: Left Arm, Patient Position: Sitting, Cuff Size: Normal)   Pulse 76   Temp 98.1 F (36.7 C) (Oral)   Wt 163 lb 3.2 oz (74 kg)   SpO2 100%   BMI 30.84 kg/m    Vitals:   02/24/18 1041  BP: 118/68  Pulse: 76  Temp: 98.1 F (36.7 C)  TempSrc: Oral  SpO2:  100%  Weight: 163 lb 3.2 oz (74 kg)     Physical Exam Constitutional:      Appearance: Normal appearance. She is well-developed.  HENT:     Head: Normocephalic and atraumatic.     Right Ear: External ear normal.     Left Ear: External ear normal.     Nose: Nose normal.     Mouth/Throat:     Pharynx: Oropharynx is clear.  Eyes:     Conjunctiva/sclera: Conjunctivae normal.     Pupils: Pupils are equal, round, and reactive to light.  Neck:     Musculoskeletal: Normal range of motion and neck supple.  Thyroid: No thyromegaly.  Cardiovascular:     Rate and Rhythm: Normal rate and regular rhythm.     Heart sounds: Normal heart sounds.  Pulmonary:     Effort: Pulmonary effort is normal.     Breath sounds: Normal breath sounds.  Chest:     Breasts:        Right: Normal.        Left: Normal.  Abdominal:     Palpations: Abdomen is soft.  Musculoskeletal: Normal range of motion.  Skin:    General: Skin is warm and dry.  Neurological:     General: No focal deficit present.     Mental Status: She is alert and oriented to person, place, and time. Mental status is at baseline.     Deep Tendon Reflexes: Reflexes are normal and symmetric.  Psychiatric:        Mood and Affect: Mood normal.        Behavior: Behavior normal.        Thought Content: Thought content normal.        Judgment: Judgment normal.      Depression Screen PHQ 2/9 Scores 02/24/2018 12/30/2017 01/19/2017 12/23/2016  PHQ - 2 Score 0 0 0 1  PHQ- 9 Score 2 - 4 5       Assessment & Plan:     Routine Health Maintenance and Physical Exam  Exercise Activities and Dietary recommendations Goals    . DIET - INCREASE WATER INTAKE (pt-stated)     Recommend increasing water intake to 4-6 glasses a day.     . Increase water intake     Starting 12/05/15, I will increase my water intake to 4 glasses a day.       Immunization History  Administered Date(s) Administered  . Influenza, High Dose Seasonal PF  10/23/2014, 09/17/2015, 12/23/2016, 12/30/2017  . Pneumococcal Conjugate-13 08/16/2013  . Pneumococcal Polysaccharide-23 11/20/2010  . Td 08/25/2003  . Tdap 10/20/2012  . Zoster 10/20/2012    Health Maintenance  Topic Date Due  . FOOT EXAM  12/02/2017  . HEMOGLOBIN A1C  01/13/2018  . OPHTHALMOLOGY EXAM  01/14/2018  . TETANUS/TDAP  10/21/2022  . INFLUENZA VACCINE  Completed  . DEXA SCAN  Completed  . PNA vac Low Risk Adult  Completed     Discussed health benefits of physical activity, and encouraged her to engage in regular exercise appropriate for her age and condition.  1. Encounter for annual physical exam   2. Type 2 diabetes mellitus with stage 1 chronic kidney disease, without long-term current use of insulin (HCC)  - HgB A1c--6.4 today - POCT glycosylated hemoglobin (Hb A1C)  3. Essential (primary) hypertension Controlled. - CBC with Differential - Comprehensive Metabolic Panel (CMET)  4. Adult hypothyroidism  - TSH  5. Hyperlipidemia, unspecified hyperlipidemia type  - Lipid Profile    -------------------------------------------------------------------- I, Porsha McClurkin, CMA, am acting as a scribe for [name]., MD.   I have done the exam and reviewed the above chart and it is accurate to the best of my knowledge. Development worker, community has been used in this note in any air is in the dictation or transcription are unintentional.  [name], MD  [place]

## 2018-03-08 ENCOUNTER — Telehealth: Payer: Self-pay

## 2018-03-08 NOTE — Telephone Encounter (Signed)
Tried calling patient to see if she requested glucose meter, lancets, and test strips from [place] mail order pharmacy. This was previously sent into [facility]. Wanted to make sure that she made this request before faxing over the form.

## 2018-03-11 NOTE — Telephone Encounter (Signed)
Attempted to contact patient voicemailbox is full at this time, will try contacting patient again at a later time. KW

## 2018-03-15 NOTE — Telephone Encounter (Signed)
Unable to contact the patient. Will d/c order for supplies as we do not know if this was per patient's request.

## 2018-03-30 DIAGNOSIS — I10 Essential (primary) hypertension: Secondary | ICD-10-CM | POA: Diagnosis not present

## 2018-03-30 DIAGNOSIS — E1122 Type 2 diabetes mellitus with diabetic chronic kidney disease: Secondary | ICD-10-CM | POA: Diagnosis not present

## 2018-03-30 DIAGNOSIS — N181 Chronic kidney disease, stage 1: Secondary | ICD-10-CM | POA: Diagnosis not present

## 2018-03-30 DIAGNOSIS — E785 Hyperlipidemia, unspecified: Secondary | ICD-10-CM | POA: Diagnosis not present

## 2018-03-30 DIAGNOSIS — E039 Hypothyroidism, unspecified: Secondary | ICD-10-CM | POA: Diagnosis not present

## 2018-03-31 ENCOUNTER — Telehealth: Payer: Self-pay

## 2018-03-31 LAB — COMPREHENSIVE METABOLIC PANEL
ALT: 7 IU/L (ref 0–32)
AST: 12 IU/L (ref 0–40)
Albumin/Globulin Ratio: 1.3 (ref 1.2–2.2)
Albumin: 4.3 g/dL (ref 3.7–4.7)
Alkaline Phosphatase: 80 IU/L (ref 39–117)
BUN/Creatinine Ratio: 15 (ref 12–28)
BUN: 16 mg/dL (ref 8–27)
Bilirubin Total: 0.4 mg/dL (ref 0.0–1.2)
CO2: 21 mmol/L (ref 20–29)
Calcium: 10.1 mg/dL (ref 8.7–10.3)
Chloride: 105 mmol/L (ref 96–106)
Creatinine, Ser: 1.09 mg/dL — ABNORMAL HIGH (ref 0.57–1.00)
GFR calc Af Amer: 57 mL/min/{1.73_m2} — ABNORMAL LOW (ref >59)
GFR calc non Af Amer: 49 mL/min/{1.73_m2} — ABNORMAL LOW (ref >59)
Globulin, Total: 3.2 g/dL (ref 1.5–4.5)
Glucose: 109 mg/dL — ABNORMAL HIGH (ref 65–99)
Potassium: 4.4 mmol/L (ref 3.5–5.2)
Sodium: 141 mmol/L (ref 134–144)
Total Protein: 7.5 g/dL (ref 6.0–8.5)

## 2018-03-31 LAB — CBC WITH DIFFERENTIAL/PLATELET
Basophils Absolute: 0.1 10*3/uL (ref 0.0–0.2)
Basos: 1 %
EOS (ABSOLUTE): 0.1 10*3/uL (ref 0.0–0.4)
Eos: 3 %
Hematocrit: 37.9 % (ref 34.0–46.6)
Hemoglobin: 12.6 g/dL (ref 11.1–15.9)
Immature Grans (Abs): 0 10*3/uL (ref 0.0–0.1)
Immature Granulocytes: 0 %
LYMPHS ABS: 1.4 10*3/uL (ref 0.7–3.1)
Lymphs: 26 %
MCH: 26.9 pg (ref 26.6–33.0)
MCHC: 33.2 g/dL (ref 31.5–35.7)
MCV: 81 fL (ref 79–97)
Monocytes Absolute: 0.6 10*3/uL (ref 0.1–0.9)
Monocytes: 12 %
Neutrophils Absolute: 3.2 10*3/uL (ref 1.4–7.0)
Neutrophils: 58 %
Platelets: 401 10*3/uL (ref 150–450)
RBC: 4.68 x10E6/uL (ref 3.77–5.28)
RDW: 12.8 % (ref 11.7–15.4)
WBC: 5.5 10*3/uL (ref 3.4–10.8)

## 2018-03-31 LAB — HEMOGLOBIN A1C
Est. average glucose Bld gHb Est-mCnc: 140 mg/dL
HEMOGLOBIN A1C: 6.5 % — AB (ref 4.8–5.6)

## 2018-03-31 LAB — LIPID PANEL
CHOL/HDL RATIO: 2.6 ratio (ref 0.0–4.4)
CHOLESTEROL TOTAL: 170 mg/dL (ref 100–199)
HDL: 65 mg/dL (ref >39)
LDL Calculated: 88 mg/dL (ref 0–99)
TRIGLYCERIDES: 85 mg/dL (ref 0–149)
VLDL Cholesterol Cal: 17 mg/dL (ref 5–40)

## 2018-03-31 LAB — TSH: TSH: 2.13 u[IU]/mL (ref 0.450–4.500)

## 2018-03-31 NOTE — Telephone Encounter (Signed)
-----   Message from Jerrol Banana., MD sent at 03/31/2018  8:31 AM EDT ----- Labs okay.  Push fluids a little bit.

## 2018-03-31 NOTE — Telephone Encounter (Signed)
Tried calling patient, no answer. Unable to leave VM due to mailbox being full. Will try again later.

## 2018-04-01 NOTE — Telephone Encounter (Signed)
Tried calling; pt's voicemail box is full.   Thanks,   -Mickel Baas

## 2018-04-02 ENCOUNTER — Ambulatory Visit
Admission: RE | Admit: 2018-04-02 | Discharge: 2018-04-02 | Disposition: A | Discharge status: Home / Self Care | Payer: Medicare HMO | Source: Ambulatory Visit | Attending: Family Medicine | Admitting: Family Medicine

## 2018-04-02 ENCOUNTER — Other Ambulatory Visit: Payer: Self-pay

## 2018-04-02 DIAGNOSIS — Z1231 Encounter for screening mammogram for malignant neoplasm of breast: Secondary | ICD-10-CM | POA: Insufficient documentation

## 2018-04-05 NOTE — Telephone Encounter (Signed)
Unable to contact the patient. Letter mailed.  

## 2018-04-09 DIAGNOSIS — E119 Type 2 diabetes mellitus without complications: Secondary | ICD-10-CM | POA: Diagnosis not present

## 2018-04-09 LAB — HM DIABETES EYE EXAM

## 2018-08-25 ENCOUNTER — Ambulatory Visit: Payer: Self-pay | Admitting: Family Medicine

## 2018-08-25 NOTE — Progress Notes (Deleted)
Patient: Cynthia Castillo Female    DOB: January 07, 1941   78 y.o.   MRN: [id number] Visit Date: 08/25/2018  Today's Provider: [name], MD   No chief complaint on file.  Subjective:     HPI  Diabetes Mellitus Type II, Follow-up:   Lab Results  Component Value Date   HGBA1C 6.5 (H) 03/30/2018   HGBA1C 6.4 (A) 02/24/2018   HGBA1C 6.0 (A) 07/14/2017   Last seen for diabetes 6 months ago.  Management since then includes none. She reports {excellent/good/fair/poor:19665} compliance with treatment. She {ACTION; IS/IS BDZ:[id number]} having side effects. *** Current symptoms include {Symptoms; diabetes:14075} and have been {Desc; course:15616}. Home blood sugar records: {diabetes glucometry results:16657}  Episodes of hypoglycemia? {yes***/no:17258}   Current Insulin Regimen: N/A Most Recent Eye Exam: 04/09/18 Weight trend: {trend:16658} Prior visit with dietician: no Current diet: {diet habits:16563} Current exercise: {exercise types:16438}  ------------------------------------------------------------------------   Hypertension, follow-up:  BP Readings from Last 3 Encounters:  02/24/18 118/68  12/30/17 (!) 160/68  07/14/17 138/64    She was last seen for hypertension 6 months ago.  BP at that visit was 118/68. Management since that visit includes ***.She reports {excellent/good/fair/poor:19665} compliance with treatment. She {ACTION; IS/IS STM:[id number]} having side effects. *** She {is/is not:9024} exercising. She is not adherent to low salt diet.   Outside blood pressures are ***. She is experiencing {Symptoms; cardiac:12860}.  Patient denies {Symptoms; cardiac:12860}.   Cardiovascular risk factors include {cv risk factors:510}.  Use of agents associated with hypertension: {bp agents assoc with hypertension:511::"none"}.   ------------------------------------------------------------------------    Lipid/Cholesterol, Follow-up:   Last seen for this  6 months ago.  Management since that visit includes ***.  Last Lipid Panel:    Component Value Date/Time   CHOL 170 03/30/2018 1509   TRIG 85 03/30/2018 1509   HDL 65 03/30/2018 1509   CHOLHDL 2.6 03/30/2018 1509   CHOLHDL 3.1 12/12/2016 0948   LDLCALC 88 03/30/2018 1509   LDLCALC 113 (H) 12/12/2016 0948    She reports {excellent/good/fair/poor:19665} compliance with treatment. She {ACTION; IS/IS LGX:[id number]} having side effects. ***  Wt Readings from Last 3 Encounters:  02/24/18 163 lb 3.2 oz (74 kg)  12/30/17 158 lb (71.7 kg)  07/14/17 146 lb (66.2 kg)    ------------------------------------------------------------------------  Allergies  Allergen Reactions  . Latex Anaphylaxis     Current Outpatient Medications:  .  amLODipine (NORVASC) 5 MG tablet, Take 1 tablet (5 mg total) by mouth daily., Disp: 90 tablet, Rfl: 3 .  glucose blood (BAYER CONTOUR NEXT TEST) test strip, Once daily,fasting E11.40, Disp: 100 each, Rfl: 11 .  Lancets MISC, 1 each by Does not apply route daily. Dx E11.40, Disp: 100 each, Rfl: 11 .  losartan-hydrochlorothiazide (HYZAAR) 100-25 MG tablet, TAKE 1 TABLET BY MOUTH ONCE DAILY, Disp: 90 tablet, Rfl: 3 .  Multiple Vitamins-Minerals (CENTRUM SILVER 50+WOMEN PO), Take by mouth daily. , Disp: , Rfl:  .  Omega-3 Fatty Acids (FISH OIL) 1200 MG CAPS, Reported on 06/19/2015, Disp: , Rfl:  .  rosuvastatin (CRESTOR) 5 MG tablet, Take 1 tablet (5 mg total) by mouth daily., Disp: 30 tablet, Rfl: 5  Review of Systems  Social History   Tobacco Use  . Smoking status: Never Smoker  . Smokeless tobacco: Never Used  Substance Use Topics  . Alcohol use: No    Alcohol/week: 0.0 standard drinks      Objective:   There were no vitals taken for this visit. There were  no vitals filed for this visit.   Physical Exam   No results found for any visits on 08/25/18.     Assessment & Plan        [name], MD  [facility] Fritzi Mandes Orin as a scribe for [name], MD.,have documented all relevant documentation on the behalf of [name], MD,as directed by  [name], MD while in the presence of [name], MD.

## 2018-08-26 ENCOUNTER — Encounter: Payer: Self-pay | Admitting: General Surgery

## 2019-01-03 ENCOUNTER — Ambulatory Visit (INDEPENDENT_AMBULATORY_CARE_PROVIDER_SITE_OTHER): Payer: Medicare HMO

## 2019-01-03 ENCOUNTER — Other Ambulatory Visit: Payer: Self-pay

## 2019-01-03 VITALS — BP 138/72 | HR 83 | Temp 98.5°F | Ht 61.0 in | Wt 165.8 lb

## 2019-01-03 DIAGNOSIS — Z23 Encounter for immunization: Secondary | ICD-10-CM

## 2019-01-03 DIAGNOSIS — Z0000 Encounter for general adult medical examination without abnormal findings: Secondary | ICD-10-CM | POA: Diagnosis not present

## 2019-01-03 NOTE — Patient Instructions (Signed)
Ms. Cynthia Castillo , Thank you for taking time to come for your Medicare Wellness Visit. I appreciate your ongoing commitment to your health goals. Please review the following plan we discussed and let me know if I can assist you in the future.   Screening recommendations/referrals: Colonoscopy: No longer required.  Mammogram: No longer required.  Bone Density: Up to date, previous DEXA was normal. No repeat needed unless advised by a physician. Recommended yearly ophthalmology/optometry visit for glaucoma screening and checkup Recommended yearly dental visit for hygiene and checkup  Vaccinations: Influenza vaccine: Administered today.  Pneumococcal vaccine: Completed series Tdap vaccine: Up to date, due 10/2022 Shingles vaccine: Pt declines today.     Advanced directives: Please bring a copy of your POA (Power of Attorney) and/or Living Will to your next appointment.   Conditions/risks identified: Recommend increasing water intake to 6-8 8 oz glasses a day.   Next appointment: 03/23/19 @ 10:20 AM with Dr Rosanna Randy   Preventive Care 77 Years and Older, Female Preventive care refers to lifestyle choices and visits with your health care provider that can promote health and wellness. What does preventive care include?  A yearly physical exam. This is also called an annual well check.  Dental exams once or twice a year.  Routine eye exams. Ask your health care provider how often you should have your eyes checked.  Personal lifestyle choices, including:  Daily care of your teeth and gums.  Regular physical activity.  Eating a healthy diet.  Avoiding tobacco and drug use.  Limiting alcohol use.  Practicing safe sex.  Taking low-dose aspirin every day.  Taking vitamin and mineral supplements as recommended by your health care provider. What happens during an annual well check? The services and screenings done by your health care provider during your annual well check will depend on  your age, overall health, lifestyle risk factors, and family history of disease. Counseling  Your health care provider may ask you questions about your:  Alcohol use.  Tobacco use.  Drug use.  Emotional well-being.  Home and relationship well-being.  Sexual activity.  Eating habits.  History of falls.  Memory and ability to understand (cognition).  Work and work Statistician.  Reproductive health. Screening  You may have the following tests or measurements:  Height, weight, and BMI.  Blood pressure.  Lipid and cholesterol levels. These may be checked every 5 years, or more frequently if you are over 62 years old.  Skin check.  Lung cancer screening. You may have this screening every year starting at age 18 if you have a 30-pack-year history of smoking and currently smoke or have quit within the past 15 years.  Fecal occult blood test (FOBT) of the stool. You may have this test every year starting at age 49.  Flexible sigmoidoscopy or colonoscopy. You may have a sigmoidoscopy every 5 years or a colonoscopy every 10 years starting at age 33.  Hepatitis C blood test.  Hepatitis B blood test.  Sexually transmitted disease (STD) testing.  Diabetes screening. This is done by checking your blood sugar (glucose) after you have not eaten for a while (fasting). You may have this done every 1-3 years.  Bone density scan. This is done to screen for osteoporosis. You may have this done starting at age 6.  Mammogram. This may be done every 1-2 years. Talk to your health care provider about how often you should have regular mammograms. Talk with your health care provider about your test results, treatment  options, and if necessary, the need for more tests. Vaccines  Your health care provider may recommend certain vaccines, such as:  Influenza vaccine. This is recommended every year.  Tetanus, diphtheria, and acellular pertussis (Tdap, Td) vaccine. You may need a Td booster  every 10 years.  Zoster vaccine. You may need this after age 90.  Pneumococcal 13-valent conjugate (PCV13) vaccine. One dose is recommended after age 12.  Pneumococcal polysaccharide (PPSV23) vaccine. One dose is recommended after age 50. Talk to your health care provider about which screenings and vaccines you need and how often you need them. This information is not intended to replace advice given to you by your health care provider. Make sure you discuss any questions you have with your health care provider. Document Released: 02/02/2015 Document Revised: 09/26/2015 Document Reviewed: 11/07/2014 Elsevier Interactive Patient Education  2017 [place] Prevention in the Home Falls can cause injuries. They can happen to people of all ages. There are many things you can do to make your home safe and to help prevent falls. What can I do on the outside of my home?  Regularly fix the edges of walkways and driveways and fix any cracks.  Remove anything that might make you trip as you walk through a door, such as a raised step or threshold.  Trim any bushes or trees on the path to your home.  Use bright outdoor lighting.  Clear any walking paths of anything that might make someone trip, such as rocks or tools.  Regularly check to see if handrails are loose or broken. Make sure that both sides of any steps have handrails.  Any raised decks and porches should have guardrails on the edges.  Have any leaves, snow, or ice cleared regularly.  Use sand or salt on walking paths during winter.  Clean up any spills in your garage right away. This includes oil or grease spills. What can I do in the bathroom?  Use night lights.  Install grab bars by the toilet and in the tub and shower. Do not use towel bars as grab bars.  Use non-skid mats or decals in the tub or shower.  If you need to sit down in the shower, use a plastic, non-slip stool.  Keep the floor dry. Clean up any  water that spills on the floor as soon as it happens.  Remove soap buildup in the tub or shower regularly.  Attach bath mats securely with double-sided non-slip rug tape.  Do not have throw rugs and other things on the floor that can make you trip. What can I do in the bedroom?  Use night lights.  Make sure that you have a light by your bed that is easy to reach.  Do not use any sheets or blankets that are too big for your bed. They should not hang down onto the floor.  Have a firm chair that has side arms. You can use this for support while you get dressed.  Do not have throw rugs and other things on the floor that can make you trip. What can I do in the kitchen?  Clean up any spills right away.  Avoid walking on wet floors.  Keep items that you use a lot in easy-to-reach places.  If you need to reach something above you, use a strong step stool that has a grab bar.  Keep electrical cords out of the way.  Do not use floor polish or wax that makes floors slippery.  If you must use wax, use non-skid floor wax.  Do not have throw rugs and other things on the floor that can make you trip. What can I do with my stairs?  Do not leave any items on the stairs.  Make sure that there are handrails on both sides of the stairs and use them. Fix handrails that are broken or loose. Make sure that handrails are as long as the stairways.  Check any carpeting to make sure that it is firmly attached to the stairs. Fix any carpet that is loose or worn.  Avoid having throw rugs at the top or bottom of the stairs. If you do have throw rugs, attach them to the floor with carpet tape.  Make sure that you have a light switch at the top of the stairs and the bottom of the stairs. If you do not have them, ask someone to add them for you. What else can I do to help prevent falls?  Wear shoes that:  Do not have high heels.  Have rubber bottoms.  Are comfortable and fit you well.  Are closed  at the toe. Do not wear sandals.  If you use a stepladder:  Make sure that it is fully opened. Do not climb a closed stepladder.  Make sure that both sides of the stepladder are locked into place.  Ask someone to hold it for you, if possible.  Clearly mark and make sure that you can see:  Any grab bars or handrails.  First and last steps.  Where the edge of each step is.  Use tools that help you move around (mobility aids) if they are needed. These include:  Canes.  Walkers.  Scooters.  Crutches.  Turn on the lights when you go into a dark area. Replace any light bulbs as soon as they burn out.  Set up your furniture so you have a clear path. Avoid moving your furniture around.  If any of your floors are uneven, fix them.  If there are any pets around you, be aware of where they are.  Review your medicines with your doctor. Some medicines can make you feel dizzy. This can increase your chance of falling. Ask your doctor what other things that you can do to help prevent falls. This information is not intended to replace advice given to you by your health care provider. Make sure you discuss any questions you have with your health care provider. Document Released: 11/02/2008 Document Revised: 06/14/2015 Document Reviewed: 02/10/2014 Elsevier Interactive Patient Education  2017 [employer].

## 2019-01-03 NOTE — Progress Notes (Signed)
Subjective:   Cynthia Castillo is a 78 y.o. female who presents for Medicare Annual (Subsequent) preventive examination.  Review of Systems:  N/A  Cardiac Risk Factors include: advanced age (>23men, >45 women);dyslipidemia;hypertension     Objective:     Vitals: BP 138/72 (BP Location: Right Arm)   Pulse 83   Temp 98.5 F (36.9 C) (Oral)   Ht 5\' 1"  (1.549 m)   Wt 165 lb 12.8 oz (75.2 kg)   BMI 31.33 kg/m   Body mass index is 31.33 kg/m.  Advanced Directives 01/03/2019 12/30/2017 12/23/2016 12/05/2015 06/19/2015 06/06/2015 08/29/2014  Does Patient Have a Medical Advance Directive? Yes No No Yes Yes Yes No  Type of Advance Directive Living will - - Living will Living will Living will -  Copy of [place] in Chart? - - - No - copy requested - No - copy requested -  Would patient like information on creating a medical advance directive? - No - Patient declined Yes (MAU/Ambulatory/Procedural Areas - Information given) - - - -    Tobacco Social History   Tobacco Use  Smoking Status Never Smoker  Smokeless Tobacco Never Used     Counseling given: Not Answered   Clinical Intake:  Pre-visit preparation completed: Yes  Pain : No/denies pain Pain Score: 0-No pain     Nutritional Status: BMI > 30  Obese Nutritional Risks: None Diabetes: Yes  How often do you need to have someone help you when you read instructions, pamphlets, or other written materials from your doctor or pharmacy?: 1 - Never   Diabetes:  Is the patient diabetic?  Yes type 2  If diabetic, was a CBG obtained today?  No  Did the patient bring in their glucometer from home?  No  How often do you monitor your CBG's? Not at all lately.   Financial Strains and Diabetes Management:  Are you having any financial strains with the device, your supplies or your medication? No .  Does the patient want to be seen by Chronic Care Management for management of their diabetes?  No  Would the  patient like to be referred to a Nutritionist or for Diabetic Management?  No   Diabetic Exams:  Diabetic Eye Exam: Completed 04/09/18. Repeat yearly.   Diabetic Foot Exam: Completed 12/02/16. Pt has been advised about the importance in completing this exam. Note made to follow up on this at next in office apt.     Interpreter Needed?: No  Information entered by :: [facility], LPN  Past Medical History:  Diagnosis Date  . Arthritis    hands  . Complication of anesthesia   . Diabetes mellitus without complication ([place]) 123456   diet control  . Hypertension   . Murmur   . PONV (postoperative nausea and vomiting)    Past Surgical History:  Procedure Laterality Date  . ABDOMINAL HYSTERECTOMY  1972  . APPENDECTOMY    . BREAST CYST EXCISION Left 1972  . COLONOSCOPY  2007   Dr Bary Castilla  . COLONOSCOPY WITH PROPOFOL N/A 03/21/2015   Procedure: COLONOSCOPY WITH PROPOFOL;  Surgeon: Robert Bellow, MD;  [facility]: [facility] ENDOSCOPY;  Service: Endoscopy;  Laterality: N/A;  . REPLACEMENT TOTAL KNEE BILATERAL    . TOTAL KNEE ARTHROPLASTY Right 07/25/2014   Procedure: TOTAL KNEE ARTHROPLASTY;  Surgeon: Hessie Knows, MD;  Location: [facility] ORS;  Service: Orthopedics;  Laterality: Right;  . TOTAL KNEE ARTHROPLASTY Left 06/19/2015   Procedure: TOTAL KNEE ARTHROPLASTY;  Surgeon: Legrand Como  Rudene Christians, MD;  Location: [facility] ORS;  Service: Orthopedics;  Laterality: Left;   Family History  Problem Relation Age of Onset  . Stroke Mother   . Heart attack Father   . Cancer Sister 31       stomach/Shirley  . Cancer Sister 61       breast/Rosa Graves  . Breast cancer Sister   . Cancer Maternal Grandmother        unknown   Social History   Socioeconomic History  . Marital status: Widowed    Spouse name: Not on file  . Number of children: 1  . Years of education: Not on file  . Highest education level: Some college, no degree  Occupational History  . Occupation: retired  Tobacco Use  . Smoking status: Never  Smoker  . Smokeless tobacco: Never Used  Substance and Sexual Activity  . Alcohol use: No    Alcohol/week: 0.0 standard drinks  . Drug use: No  . Sexual activity: Never  Other Topics Concern  . Not on file  Social History Narrative  . Not on file   Social Determinants of Health   Financial Resource Strain:   . Difficulty of Paying Living Expenses: Not on file  Food Insecurity:   . Worried About Charity fundraiser in the Last Year: Not on file  . Ran Out of Food in the Last Year: Not on file  Transportation Needs:   . Lack of Transportation (Medical): Not on file  . Lack of Transportation (Non-Medical): Not on file  Physical Activity:   . Days of Exercise per Week: Not on file  . Minutes of Exercise per Session: Not on file  Stress:   . Feeling of Stress : Not on file  Social Connections:   . Frequency of Communication with Friends and Family: Not on file  . Frequency of Social Gatherings with Friends and Family: Not on file  . Attends Religious Services: Not on file  . Active Member of Clubs or Organizations: Not on file  . Attends Archivist Meetings: Not on file  . Marital Status: Not on file    Outpatient Encounter Medications as of 01/03/2019  Medication Sig  . amLODipine (NORVASC) 5 MG tablet Take 1 tablet (5 mg total) by mouth daily.  Marland Kitchen glucose blood (BAYER CONTOUR NEXT TEST) test strip Once daily,fasting E11.40  . Lancets MISC 1 each by Does not apply route daily. Dx E11.40  . Multiple Vitamins-Minerals (CENTRUM SILVER 50+WOMEN PO) Take by mouth daily.   . Omega-3 Fatty Acids (FISH OIL) 1200 MG CAPS Reported on 06/19/2015  . rosuvastatin (CRESTOR) 5 MG tablet Take 1 tablet (5 mg total) by mouth daily.  Marland Kitchen losartan-hydrochlorothiazide (HYZAAR) 100-25 MG tablet TAKE 1 TABLET BY MOUTH ONCE DAILY (Patient not taking: Reported on 01/03/2019)   No facility-administered encounter medications on file as of 01/03/2019.    Activities of Daily Living In your  present state of health, do you have any difficulty performing the following activities: 01/03/2019 02/24/2018  Hearing? N N  Vision? N N  Difficulty concentrating or making decisions? N N  Walking or climbing stairs? N N  Dressing or bathing? N N  Doing errands, shopping? N N  Preparing Food and eating ? N -  Using the Toilet? N -  In the past six months, have you accidently leaked urine? N -  Do you have problems with loss of bowel control? N -  Managing your Medications? N -  Managing your Finances? N -  Housekeeping or managing your Housekeeping? N -  Some recent data might be hidden    Patient Care Team: Jerrol Banana., MD as PCP - General (Family Medicine) Bary Castilla, Forest Gleason, MD (General Surgery) Leandrew Koyanagi, MD as Referring Physician (Ophthalmology)    Assessment:   This is a routine wellness examination for Leveta.  Exercise Activities and Dietary recommendations Current Exercise Habits: The patient does not participate in regular exercise at present, Exercise limited by: orthopedic condition(s)  Goals      Patient Stated   . DIET - INCREASE WATER INTAKE (pt-stated)     Recommend increasing water intake to 4-6 glasses a day.        Fall Risk: Fall Risk  01/03/2019 02/24/2018 12/30/2017 12/23/2016 12/05/2015  Falls in the past year? 0 0 0 Yes No  Number falls in past yr: 0 0 - 1 -  Injury with Fall? 0 0 - Yes -  Comment - - - had to have both knees replaced following accident (fell out of bed) -  Follow up - - - Falls prevention discussed -    FALL RISK PREVENTION PERTAINING TO THE HOME:  Any stairs in or around the home? Yes  If so, are there any without handrails? No   Home free of loose throw rugs in walkways, pet beds, electrical cords, etc? Yes  Adequate lighting in your home to reduce risk of falls? Yes   ASSISTIVE DEVICES UTILIZED TO PREVENT FALLS:  Life alert? No  Use of a cane, walker or w/c? Yes  Grab bars in the bathroom? Yes    Shower chair or bench in shower? Yes  Elevated toilet seat or a handicapped toilet? No   TIMED UP AND GO:  Was the test performed? No .    Depression Screen PHQ 2/9 Scores 01/03/2019 02/24/2018 12/30/2017 01/19/2017  PHQ - 2 Score 0 0 0 0  PHQ- 9 Score - 2 - 4     Cognitive Function: Declined today.      6CIT Screen 12/30/2017 12/05/2015  What Year? 0 points 0 points  What month? 0 points 0 points  What time? 0 points 0 points  Count back from 20 0 points 0 points  Months in reverse 2 points 2 points  Repeat phrase 0 points 4 points  Total Score 2 6    Immunization History  Administered Date(s) Administered  . Fluad Quad(high Dose 65+) 01/03/2019  . Influenza, High Dose Seasonal PF 10/23/2014, 09/17/2015, 12/23/2016, 12/30/2017  . Pneumococcal Conjugate-13 08/16/2013  . Pneumococcal Polysaccharide-23 11/20/2010  . Td 08/25/2003  . Tdap 10/20/2012  . Zoster 10/20/2012    Qualifies for Shingles Vaccine? Yes  Zostavax completed 10/20/12. Due for Shingrix. Education has been provided regarding the importance of this vaccine. Pt has been advised to call insurance company to determine out of pocket expense. Advised may also receive vaccine at local pharmacy or Health Dept. Verbalized acceptance and understanding.  Tdap: Up to date  Flu Vaccine: Due for Flu vaccine. Does the patient want to receive this vaccine today?  Yes .   Pneumococcal Vaccine: Completed series  Screening Tests Health Maintenance  Topic Date Due  . FOOT EXAM  12/02/2017  . HEMOGLOBIN A1C  09/30/2018  . OPHTHALMOLOGY EXAM  04/09/2019  . TETANUS/TDAP  10/21/2022  . INFLUENZA VACCINE  Completed  . DEXA SCAN  Completed  . PNA vac Low Risk Adult  Completed    Cancer Screenings:  Colorectal  Screening: No longer required.   Mammogram: No longer required.   Bone Density: Completed 05/22/10. Results reflect NORMAL. No repeat needed unless advised by a physician.   Lung Cancer Screening: (Low Dose  CT Chest recommended if Age 65-80 years, 30 pack-year currently smoking OR have quit w/in 15years.) does not qualify.   Additional Screening:  Dental Screening: Recommended annual dental exams for proper oral hygiene   Community Resource Referral:  CRR required this visit?  No       Plan:  I have personally reviewed and addressed the Medicare Annual Wellness questionnaire and have noted the following in the patient's chart:  A. Medical and social history B. Use of alcohol, tobacco or illicit drugs  C. Current medications and supplements D. Functional ability and status E.  Nutritional status F.  Physical activity G. Advance directives H. List of other physicians I.  Hospitalizations, surgeries, and ER visits in previous 12 months J.  [place] such as hearing and vision if needed, cognitive and depression L. Referrals and appointments   In addition, I have reviewed and discussed with patient certain preventive protocols, quality metrics, and best practice recommendations. A written personalized care plan for preventive services as well as general preventive health recommendations were provided to patient.   Glendora Score, [place]  [id number] Nurse Health Advisor   Nurse Notes: Pt needs a diabetic foot exam and Hgb A1c check at next in office apt.

## 2019-03-14 ENCOUNTER — Other Ambulatory Visit: Payer: Self-pay | Admitting: Family Medicine

## 2019-03-14 DIAGNOSIS — I10 Essential (primary) hypertension: Secondary | ICD-10-CM

## 2019-03-22 NOTE — Progress Notes (Deleted)
Patient: Cynthia Castillo, Female    DOB: January 22, 1940, 79 y.o.   MRN: [id number] Visit Date: 03/22/2019  Today's Provider: [name], MD   No chief complaint on file.  Subjective:     Patient had AWV with [facility] 01/03/2019.   Complete Physical Cynthia Castillo is a 79 y.o. female. She feels {DESC; WELL/FAIRLY WELL/POORLY:18703}. She reports exercising ***. She reports she is sleeping {DESC; WELL/FAIRLY WELL/POORLY:18703}.  -----------------------------------------------------------  Colonoscopy: 03/21/2015 Mammogram: 04/02/2018  Review of Systems  Social History   Socioeconomic History  . Marital status: Widowed    Spouse name: Not on file  . Number of children: 1  . Years of education: Not on file  . Highest education level: Some college, no degree  Occupational History  . Occupation: retired  Tobacco Use  . Smoking status: Never Smoker  . Smokeless tobacco: Never Used  Substance and Sexual Activity  . Alcohol use: No    Alcohol/week: 0.0 standard drinks  . Drug use: No  . Sexual activity: Never  Other Topics Concern  . Not on file  Social History Narrative  . Not on file   Social Determinants of Health   Financial Resource Strain:   . Difficulty of Paying Living Expenses: Not on file  Food Insecurity:   . Worried About Charity fundraiser in the Last Year: Not on file  . Ran Out of Food in the Last Year: Not on file  Transportation Needs:   . Lack of Transportation (Medical): Not on file  . Lack of Transportation (Non-Medical): Not on file  Physical Activity:   . Days of Exercise per Week: Not on file  . Minutes of Exercise per Session: Not on file  Stress:   . Feeling of Stress : Not on file  Social Connections:   . Frequency of Communication with Friends and Family: Not on file  . Frequency of Social Gatherings with Friends and Family: Not on file  . Attends Religious Services: Not on file  . Active Member of Clubs or Organizations: Not on  file  . Attends Archivist Meetings: Not on file  . Marital Status: Not on file  Intimate Partner Violence:   . Fear of Current or Ex-Partner: Not on file  . Emotionally Abused: Not on file  . Physically Abused: Not on file  . Sexually Abused: Not on file    Past Medical History:  Diagnosis Date  . Arthritis    hands  . Complication of anesthesia   . Diabetes mellitus without complication ([place]) 123456   diet control  . Hypertension   . Murmur   . PONV (postoperative nausea and vomiting)      Patient Active Problem List   Diagnosis Date Noted  . Primary osteoarthritis of left knee 06/19/2015  . Encounter for screening colonoscopy 01/31/2015  . Arthritis 01/01/2015  . Allergic rhinitis 08/15/2014  . Arthritis of knee 08/15/2014  . Endometriosis 08/15/2014  . HLD (hyperlipidemia) 08/15/2014  . Adult hypothyroidism 08/15/2014  . Adiposity 08/15/2014  . Primary osteoarthritis of knee 07/25/2014  . Mastalgia 02/01/2014  . Diabetes mellitus, type 2 ([place]) 12/27/2007  . Avitaminosis D 12/27/2007  . Deficiency, disaccharidase intestinal 09/18/2005  . Allergic reaction 12/09/1994  . Bloodgood disease 10/19/1990  . Essential (primary) hypertension 10/19/1990    Past Surgical History:  Procedure Laterality Date  . ABDOMINAL HYSTERECTOMY  1972  . APPENDECTOMY    . BREAST CYST EXCISION Left 1972  .  COLONOSCOPY  2007   Dr Bary Castilla  . COLONOSCOPY WITH PROPOFOL N/A 03/21/2015   Procedure: COLONOSCOPY WITH PROPOFOL;  Surgeon: Robert Bellow, MD;  Location[facility] ENDOSCOPY;  Service: Endoscopy;  Laterality: N/A;  . REPLACEMENT TOTAL KNEE BILATERAL    . TOTAL KNEE ARTHROPLASTY Right 07/25/2014   Procedure: TOTAL KNEE ARTHROPLASTY;  Surgeon: Hessie Knows, MD;  Location: [facility] ORS;  Service: Orthopedics;  Laterality: Right;  . TOTAL KNEE ARTHROPLASTY Left 06/19/2015   Procedure: TOTAL KNEE ARTHROPLASTY;  Surgeon: Hessie Knows, MD;  Location: [facility] ORS;  Service: Orthopedics;   Laterality: Left;    Her family history includes Breast cancer in her sister; Cancer in her maternal grandmother; Cancer (age of onset: 2) in her sister; Cancer (age of onset: 57) in her sister; Heart attack in her father; Stroke in her mother.   Current Outpatient Medications:  .  amLODipine (NORVASC) 5 MG tablet, Take 1 tablet (5 mg total) by mouth daily., Disp: 90 tablet, Rfl: 3 .  glucose blood (BAYER CONTOUR NEXT TEST) test strip, Once daily,fasting E11.40, Disp: 100 each, Rfl: 11 .  Lancets MISC, 1 each by Does not apply route daily. Dx E11.40, Disp: 100 each, Rfl: 11 .  losartan-hydrochlorothiazide (HYZAAR) 100-25 MG tablet, Take 1 tablet by mouth once daily, Disp: 90 tablet, Rfl: 0 .  Multiple Vitamins-Minerals (CENTRUM SILVER 50+WOMEN PO), Take by mouth daily. , Disp: , Rfl:  .  Omega-3 Fatty Acids (FISH OIL) 1200 MG CAPS, Reported on 06/19/2015, Disp: , Rfl:  .  rosuvastatin (CRESTOR) 5 MG tablet, Take 1 tablet (5 mg total) by mouth daily., Disp: 30 tablet, Rfl: 5  Patient Care Team: Jerrol Banana., MD as PCP - General (Family Medicine) Bary Castilla, Forest Gleason, MD (General Surgery) Leandrew Koyanagi, MD as Referring Physician (Ophthalmology)     Objective:    Vitals: There were no vitals taken for this visit.  Physical Exam  Activities of Daily Living In your present state of health, do you have any difficulty performing the following activities: 01/03/2019  Hearing? N  Vision? N  Difficulty concentrating or making decisions? N  Walking or climbing stairs? N  Dressing or bathing? N  Doing errands, shopping? N  Preparing Food and eating ? N  Using the Toilet? N  In the past six months, have you accidently leaked urine? N  Do you have problems with loss of bowel control? N  Managing your Medications? N  Managing your Finances? N  Housekeeping or managing your Housekeeping? N  Some recent data might be hidden    Fall Risk Assessment Fall Risk  01/03/2019  02/24/2018 12/30/2017 12/23/2016 12/05/2015  Falls in the past year? 0 0 0 Yes No  Number falls in past yr: 0 0 - 1 -  Injury with Fall? 0 0 - Yes -  Comment - - - had to have both knees replaced following accident (fell out of bed) -  Follow up - - - Falls prevention discussed -     Depression Screen PHQ 2/9 Scores 01/03/2019 02/24/2018 12/30/2017 01/19/2017  PHQ - 2 Score 0 0 0 0  PHQ- 9 Score - 2 - 4    6CIT Screen 12/30/2017  What Year? 0 points  What month? 0 points  What time? 0 points  Count back from 20 0 points  Months in reverse 2 points  Repeat phrase 0 points  Total Score 2       Assessment & Plan:    Annual Physical Reviewed  patient's Family Medical History Reviewed and updated list of patient's medical providers Assessment of cognitive impairment was done Assessed patient's functional ability Established a written schedule for health screening [place] Completed and Reviewed  Exercise Activities and Dietary recommendations Goals    . DIET - INCREASE WATER INTAKE (pt-stated)     Recommend increasing water intake to 4-6 glasses a day.        Immunization History  Administered Date(s) Administered  . Fluad Quad(high Dose 65+) 01/03/2019  . Influenza, High Dose Seasonal PF 10/23/2014, 09/17/2015, 12/23/2016, 12/30/2017  . Pneumococcal Conjugate-13 08/16/2013  . Pneumococcal Polysaccharide-23 11/20/2010  . Td 08/25/2003  . Tdap 10/20/2012  . Zoster 10/20/2012    Health Maintenance  Topic Date Due  . FOOT EXAM  12/02/2017  . HEMOGLOBIN A1C  09/30/2018  . OPHTHALMOLOGY EXAM  04/09/2019  . TETANUS/TDAP  10/21/2022  . INFLUENZA VACCINE  Completed  . DEXA SCAN  Completed  . PNA vac Low Risk Adult  Completed     Discussed health benefits of physical activity, and encouraged her to engage in regular exercise appropriate for her age and condition.      ------------------------------------------------------------------------------------------------------------    [name], MD  [place]

## 2019-03-23 ENCOUNTER — Encounter: Payer: Medicare HMO | Admitting: Family Medicine

## 2019-05-16 ENCOUNTER — Telehealth: Payer: Self-pay | Admitting: *Deleted

## 2019-05-16 ENCOUNTER — Other Ambulatory Visit: Payer: Self-pay | Admitting: Family Medicine

## 2019-05-16 DIAGNOSIS — E7849 Other hyperlipidemia: Secondary | ICD-10-CM

## 2019-05-16 NOTE — Telephone Encounter (Signed)
Attempted to reach patient to schedule visit. No answer and unable to leave VM.  Requested medication (s) are due for refill today   YES  Requested medication (s) are on the active medication list YES  Future visit scheduled  Not with the physician.  Future appointment for Medicare Wellness in December 2021.  LOV with provider was 02/24/18. Last 2 appointments were No show. This medication last ordered 07/14/17.   Last labs 03/30/18.  Note to clinic: Routing to office for consideration.    Requested Prescriptions  Pending Prescriptions Disp Refills   rosuvastatin (CRESTOR) 5 MG tablet [Pharmacy Med Name: Rosuvastatin Calcium 5 MG Oral Tablet] 90 tablet 0    Sig: Take 1 tablet by mouth once daily      Cardiovascular:  Antilipid - Statins Failed - 05/16/2019  1:16 PM      Failed - Total Cholesterol in normal range and within 360 days    Cholesterol, Total  Date Value Ref Range Status  03/30/2018 170 100 - 199 mg/dL Final          Failed - LDL in normal range and within 360 days    LDL Cholesterol (Calc)  Date Value Ref Range Status  12/12/2016 113 (H) mg/dL (calc) Final    Comment:    Reference range: <100 . Desirable range <100 mg/dL for primary prevention;   <70 mg/dL for patients with CHD or diabetic patients  with > or = 2 CHD risk factors. Marland Kitchen LDL-C is now calculated using the Martin-Hopkins  calculation, which is a validated novel method providing  better accuracy than the Friedewald equation in the  estimation of LDL-C.  Cresenciano Genre et al. Annamaria Helling. MU:[id number]): 2061-2068  (http://education.[employer].com/faq/FAQ164)    LDL Calculated  Date Value Ref Range Status  03/30/2018 88 0 - 99 mg/dL Final          Failed - HDL in normal range and within 360 days    HDL  Date Value Ref Range Status  03/30/2018 65 >39 mg/dL Final          Failed - Triglycerides in normal range and within 360 days    Triglycerides  Date Value Ref Range Status  03/30/2018 85 0 -  149 mg/dL Final          Passed - Patient is not pregnant      Passed - Valid encounter within last 12 months    Recent Outpatient Visits           1 year ago Encounter for annual physical exam   [facility] Jerrol Banana., MD   1 year ago Type 2 diabetes mellitus with stage 1 chronic kidney disease, without long-term current use of insulin [facility])   [facility] Jerrol Banana., MD   1 year ago [facility] Jerrol Banana., MD   2 years ago Essential (primary) hypertension   [facility] Jerrol Banana., MD   2 years ago Essential (primary) hypertension   [facility] Jerrol Banana., MD

## 2019-05-16 NOTE — Telephone Encounter (Signed)
Attempted to reach patient to schedule OV in order to refill requested medication, Crestor. No answer and unable to leave VM.  LOV with provider 02/24/18. Medicare annual wellness exam on 01/03/19 with the nurse.  Patient was due for OV August '20. Routed to clinic for consideration.

## 2019-08-08 ENCOUNTER — Telehealth: Payer: Self-pay | Admitting: Family Medicine

## 2019-08-08 DIAGNOSIS — E785 Hyperlipidemia, unspecified: Secondary | ICD-10-CM

## 2019-08-08 MED ORDER — ATORVASTATIN CALCIUM 10 MG PO TABS: 10.0000 mg | ORAL_TABLET | Freq: Every day | ORAL | 2 refills | Status: DC

## 2019-08-08 NOTE — Telephone Encounter (Signed)
Pt stated that [facility] advised pt that they don't have rosuvastatin (CRESTOR) 5 MG tablet in stock and don't know when they will. Pt is requesting for something similar to be sent to [facility]. Please advised. Thanks TNP

## 2019-08-08 NOTE — Telephone Encounter (Signed)
Called to advise patient that medication would be sent to [facility] and that she needs an office visit. Unable to leave voicemail for patient because voicemail was full. Please advise if patient calls back.

## 2019-08-08 NOTE — Telephone Encounter (Signed)
Atorvastatin 10 mg nightly, #30, 2 refills.  Needs appointment.  Not seen since a year and a half

## 2019-10-14 NOTE — Progress Notes (Signed)
I,Nycholas Rayner,acting as a scribe for [name], MD.,have documented all relevant documentation on the behalf of [name], MD,as directed by  [name], MD while in the presence of [name], MD.  Established patient visit   Patient: Cynthia Castillo   DOB: 12/25/1940   79 y.o. Female  MRN: [id number] Visit Date: 10/17/2019  Today's healthcare provider: [name], MD   Chief Complaint  Patient presents with  . Diabetes  . Follow-up  . Hyperlipidemia  . Hypertension  . Hypothyroidism   Subjective    HPI  Diabetes Mellitus Type II, follow-up  Lab Results  Component Value Date   HGBA1C 6.5 (H) 03/30/2018   HGBA1C 6.4 (A) 02/24/2018   HGBA1C 6.0 (A) 07/14/2017   Last seen for diabetes 02/24/2018.  Management since then includes continuing the same treatment. She reports fair compliance with treatment. She is not having side effects. none  Home blood sugar records: fasting range: not checking  Episodes of hypoglycemia? No n/a    Current insulin regiment: n/a Most Recent Eye Exam: 04/09/2018  -------------------------------------------------------------------- Hypertension, follow-up  BP Readings from Last 3 Encounters:  10/17/19 (!) 189/69  01/03/19 138/72  02/24/18 118/68   Wt Readings from Last 3 Encounters:  10/17/19 167 lb (75.8 kg)  01/03/19 165 lb 12.8 oz (75.2 kg)  02/24/18 163 lb 3.2 oz (74 kg)     She was last seen for hypertension 02/24/2018.  BP at that visit was 118/68. Management since that visit includes; Controlled. She reports good compliance with treatment. She is not having side effects. none She is not exercising. She is not adherent to low salt diet.   Outside blood pressures are not checking.  She does not smoke.  Use of agents associated with hypertension: none.   -------------------------------------------------------------------- Lipid/Cholesterol, follow-up  Last Lipid Panel: Lab  Results  Component Value Date   CHOL 170 03/30/2018   LDLCALC 88 03/30/2018   HDL 65 03/30/2018   TRIG 85 03/30/2018    She was last seen for this 03/30/2018.  Management since that visit includes; labs checked showing-okay. She reports good compliance with treatment. She is not having side effects. none She is following a Regular diet. Current exercise: none  Last metabolic panel Lab Results  Component Value Date   GLUCOSE 109 (H) 03/30/2018   NA 141 03/30/2018   K 4.4 03/30/2018   BUN 16 03/30/2018   CREATININE 1.09 (H) 03/30/2018   GFRNONAA 49 (L) 03/30/2018   GFRAA 57 (L) 03/30/2018   CALCIUM 10.1 03/30/2018   AST 12 03/30/2018   ALT 7 03/30/2018   The 10-year ASCVD risk score Mikey Bussing DC Jr., et al., 2013) is: 50.3%  --------------------------------------------------------------------  Hypothyroid, follow-up  Lab Results  Component Value Date   TSH 2.130 03/30/2018   TSH 2.82 12/12/2016   TSH 3.480 01/07/2016   Wt Readings from Last 3 Encounters:  10/17/19 167 lb (75.8 kg)  01/03/19 165 lb 12.8 oz (75.2 kg)  02/24/18 163 lb 3.2 oz (74 kg)    She was last seen for hypothyroid 03/30/2018.  Management since that visit includes; labs checked showing-okay. She reports good compliance with treatment. She is not having side effects.  --------------------------------------------------        Medications: Outpatient Medications Prior to Visit  Medication Sig  . amLODipine (NORVASC) 5 MG tablet Take 1 tablet (5 mg total) by mouth daily.  Marland Kitchen atorvastatin (LIPITOR) 10 MG tablet Take 1 tablet (10  mg total) by mouth at bedtime.  Marland Kitchen glucose blood (BAYER CONTOUR NEXT TEST) test strip Once daily,fasting E11.40  . Lancets MISC 1 each by Does not apply route daily. Dx E11.40  . losartan-hydrochlorothiazide (HYZAAR) 100-25 MG tablet Take 1 tablet by mouth once daily  . Multiple Vitamins-Minerals (CENTRUM SILVER 50+WOMEN PO) Take by mouth daily.   . Omega-3 Fatty Acids  (FISH OIL) 1200 MG CAPS Reported on 06/19/2015  . rosuvastatin (CRESTOR) 5 MG tablet Take 1 tablet by mouth once daily   No facility-administered medications prior to visit.    Review of Systems  Constitutional: Negative for appetite change, chills, fatigue and fever.  Respiratory: Negative for chest tightness and shortness of breath.   Cardiovascular: Negative for chest pain and palpitations.  Gastrointestinal: Negative for abdominal pain, nausea and vomiting.  Neurological: Negative for dizziness and weakness.    Last hemoglobin A1c Lab Results  Component Value Date   HGBA1C 6.5 (H) 03/30/2018      Objective    BP (!) 189/69 (BP Location: Left Arm, Patient Position: Sitting, Cuff Size: Normal)   Pulse 89   Temp 98.4 F (36.9 C) (Oral)   Resp 18   Ht 5\' 1"  (1.549 m)   Wt 167 lb (75.8 kg)   SpO2 98%   BMI 31.55 kg/m    Physical Exam Vitals reviewed.  Constitutional:      Appearance: She is well-developed.  HENT:     Head: Normocephalic and atraumatic.  Eyes:     General: No scleral icterus.    Conjunctiva/sclera: Conjunctivae normal.  Neck:     Thyroid: No thyromegaly.  Cardiovascular:     Rate and Rhythm: Normal rate and regular rhythm.     Heart sounds: Normal heart sounds.  Pulmonary:     Effort: Pulmonary effort is normal.     Breath sounds: Normal breath sounds.  Abdominal:     Palpations: Abdomen is soft.  Musculoskeletal:     Comments: Patient walks with a cane.  Skin:    General: Skin is warm and dry.  Neurological:     General: No focal deficit present.     Mental Status: She is alert and oriented to person, place, and time.  Psychiatric:        Mood and Affect: Mood normal.        Behavior: Behavior normal.        Thought Content: Thought content normal.        Judgment: Judgment normal.     BP (!) 189/69 (BP Location: Left Arm, Patient Position: Sitting, Cuff Size: Normal)   Pulse 89   Temp 98.4 F (36.9 C) (Oral)   Resp 18   Ht 5\' 1"   (1.549 m)   Wt 167 lb (75.8 kg)   SpO2 98%   BMI 31.55 kg/m      No results found for any visits on 10/17/19.  Assessment & Plan     1. Type 2 diabetes mellitus with stage 1 chronic kidney disease, without long-term current use of insulin (HCC) Goal A1c of less than 7.5. She is having no hypoglycemia - Hemoglobin A1c - Lipid panel - CBC w/Diff/Platelet - Comprehensive Metabolic Panel (CMET) - TSH  2. Essential (primary) hypertension Check blood pressure at home and follow-up in 3 to 4 months. - Hemoglobin A1c - Lipid panel - CBC w/Diff/Platelet - Comprehensive Metabolic Panel (CMET) - TSH  3. Hyperlipidemia, unspecified hyperlipidemia type On Lipitor - Hemoglobin A1c - Lipid panel - CBC  w/Diff/Platelet - Comprehensive Metabolic Panel (CMET) - TSH  4. Adult hypothyroidism  - Hemoglobin A1c - Lipid panel - CBC w/Diff/Platelet - Comprehensive Metabolic Panel (CMET) - TSH   No follow-ups on file.      I, [name], MD, have reviewed all documentation for this visit. The documentation on 10/18/19 for the exam, diagnosis, procedures, and orders are all accurate and complete.    Richard Cranford Mon, MD  [facility] [phone] (phone) [phone] (fax)  [place]

## 2019-10-17 ENCOUNTER — Ambulatory Visit (INDEPENDENT_AMBULATORY_CARE_PROVIDER_SITE_OTHER): Payer: Medicare HMO | Admitting: Family Medicine

## 2019-10-17 ENCOUNTER — Encounter: Payer: Self-pay | Admitting: Family Medicine

## 2019-10-17 ENCOUNTER — Other Ambulatory Visit: Payer: Self-pay

## 2019-10-17 VITALS — BP 189/69 | HR 89 | Temp 98.4°F | Resp 18 | Ht 61.0 in | Wt 167.0 lb

## 2019-10-17 DIAGNOSIS — E039 Hypothyroidism, unspecified: Secondary | ICD-10-CM | POA: Diagnosis not present

## 2019-10-17 DIAGNOSIS — N181 Chronic kidney disease, stage 1: Secondary | ICD-10-CM

## 2019-10-17 DIAGNOSIS — I10 Essential (primary) hypertension: Secondary | ICD-10-CM | POA: Diagnosis not present

## 2019-10-17 DIAGNOSIS — E785 Hyperlipidemia, unspecified: Secondary | ICD-10-CM

## 2019-10-17 DIAGNOSIS — E1122 Type 2 diabetes mellitus with diabetic chronic kidney disease: Secondary | ICD-10-CM

## 2020-01-05 NOTE — Progress Notes (Signed)
This encounter was created in error - please disregard.

## 2020-01-09 ENCOUNTER — Other Ambulatory Visit: Payer: Self-pay

## 2020-02-09 IMAGING — MG DIGITAL SCREENING BILATERAL MAMMOGRAM WITH TOMO AND CAD
6 of 10 series · 6 of 30 positions shown · non-contrast
Comparison: Previous exam(s).

CLINICAL DATA: Screening.

EXAM:
DIGITAL SCREENING BILATERAL MAMMOGRAM WITH TOMO AND CAD

[L CC synth-2D]
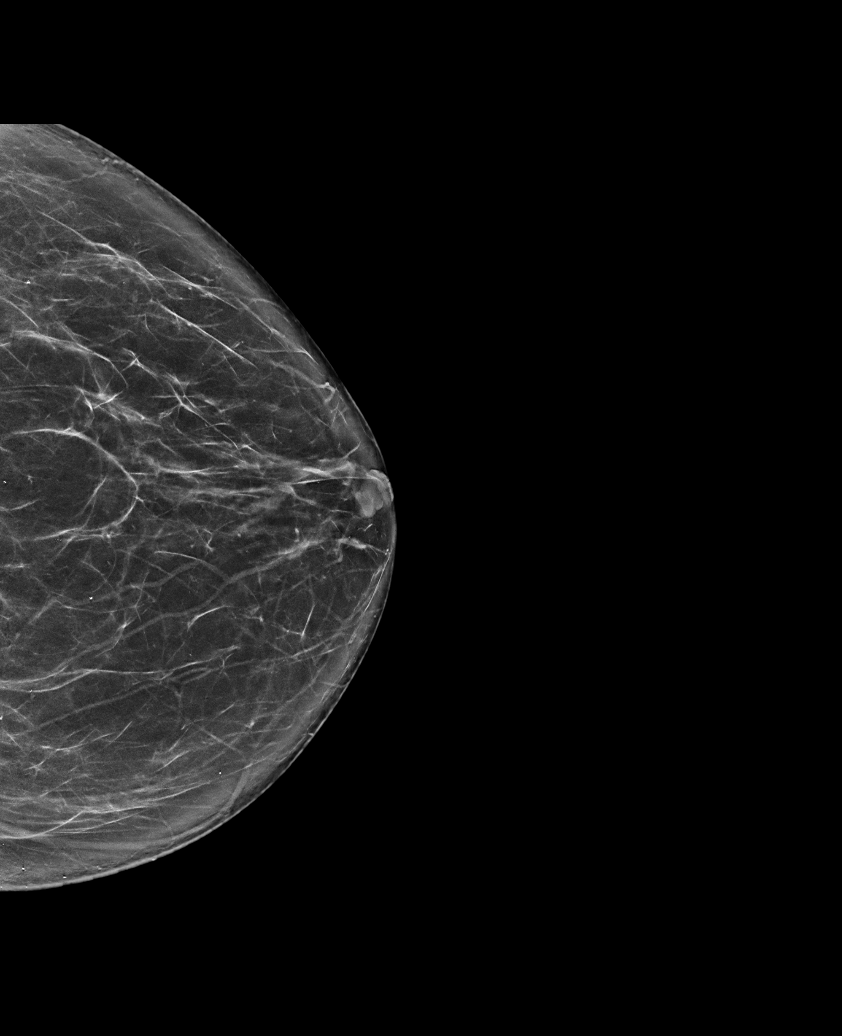

[R MLO synth-2D (1 of 2)]
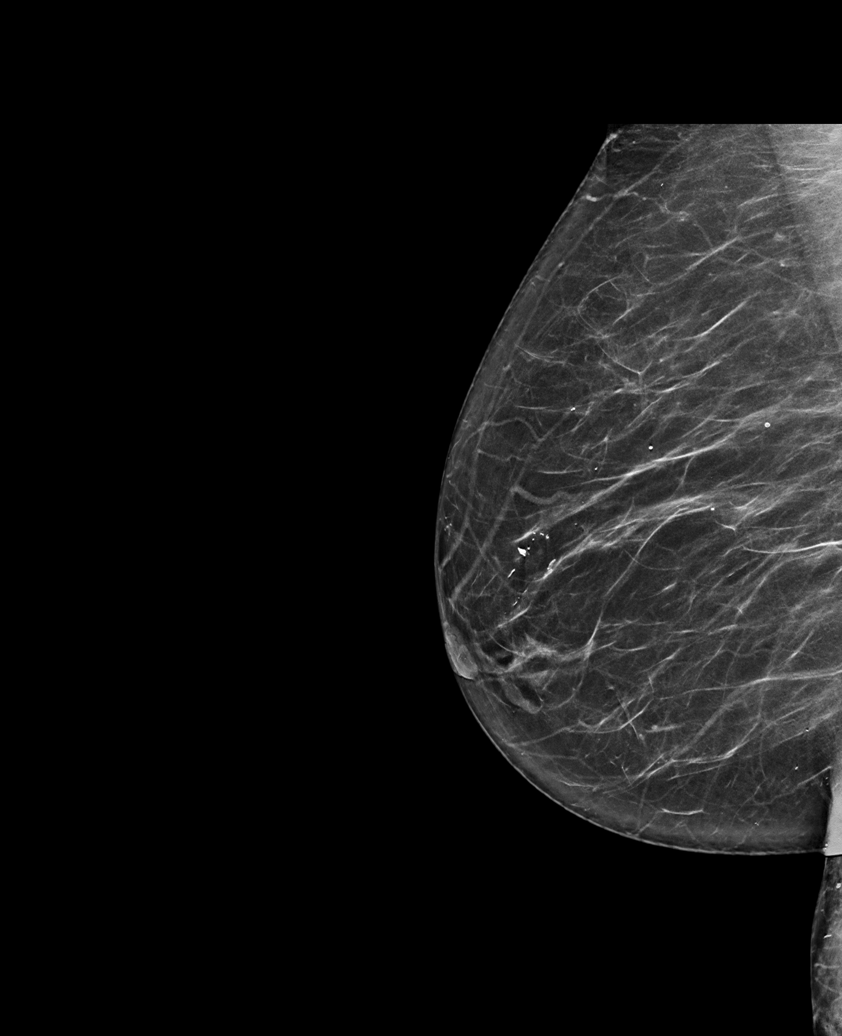

[R MLO synth-2D (2 of 2)]
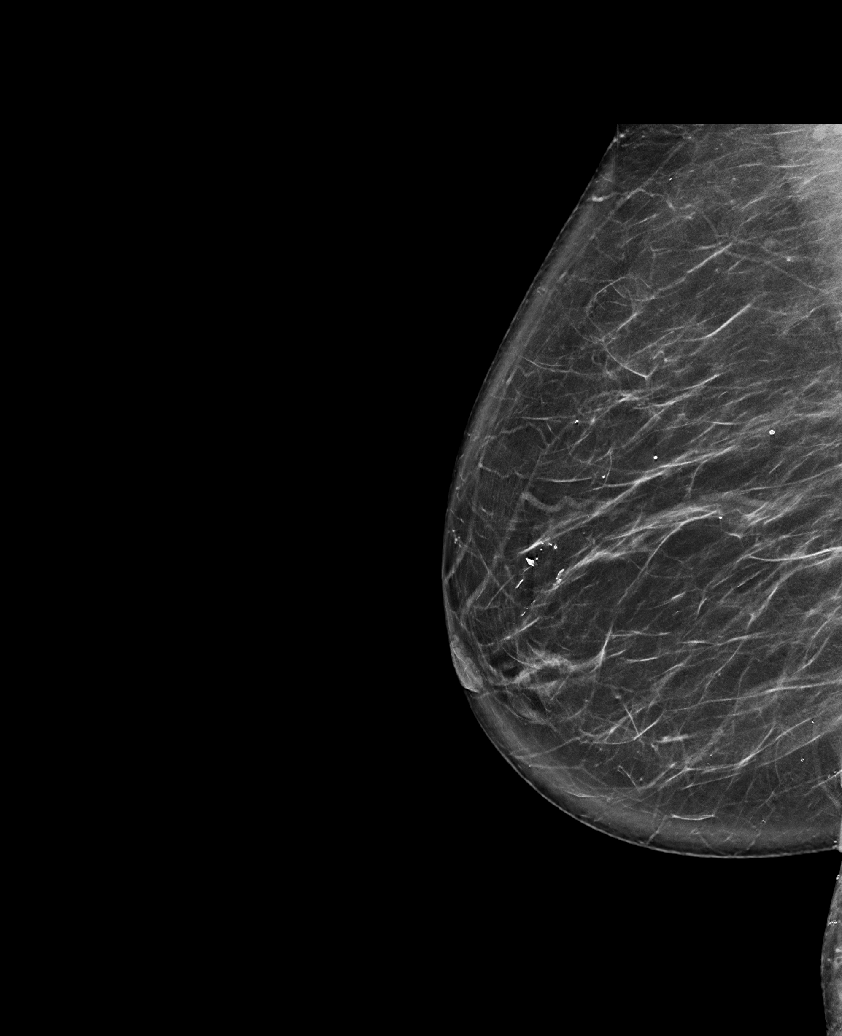

[L MLO synth-2D]
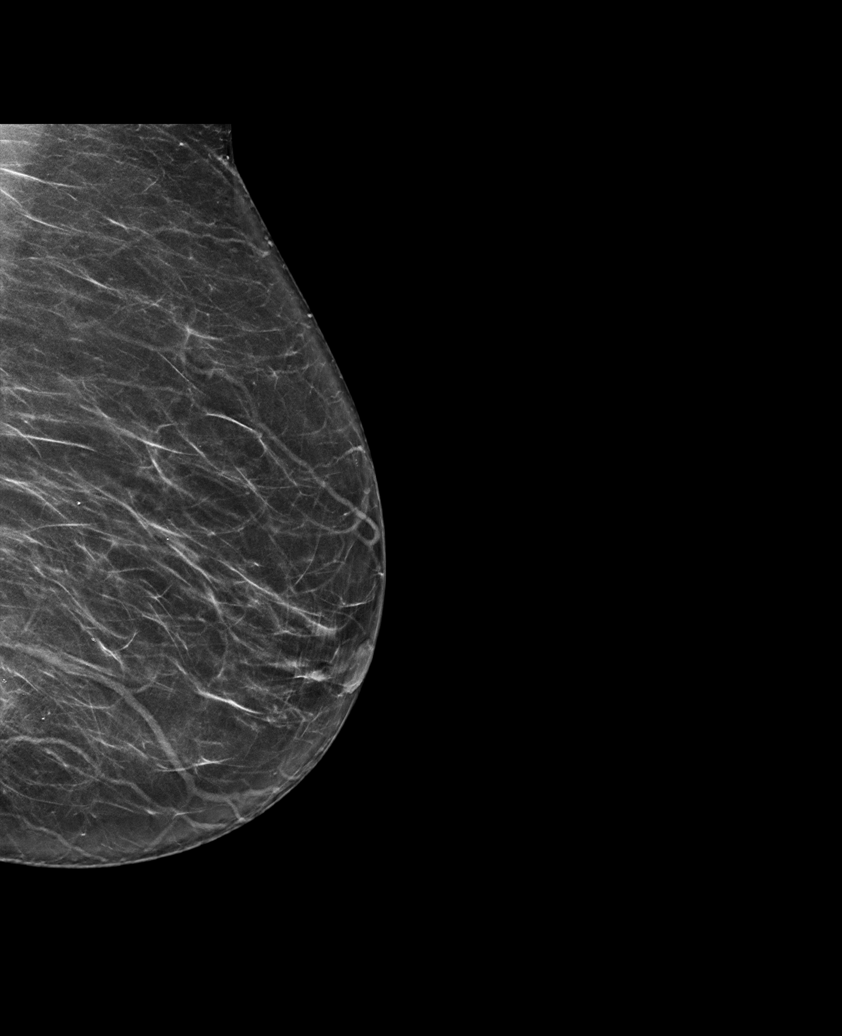

[R CC synth-2D]
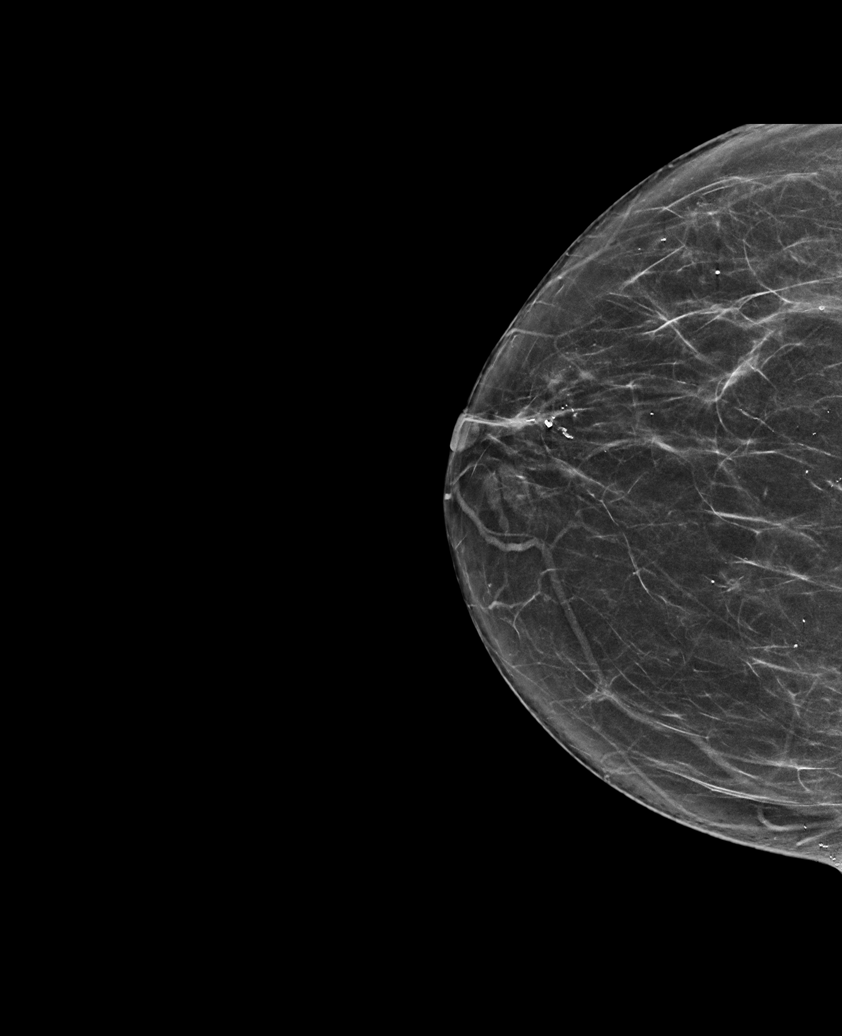

[R CC tomo · tomo slice 33/66.0]
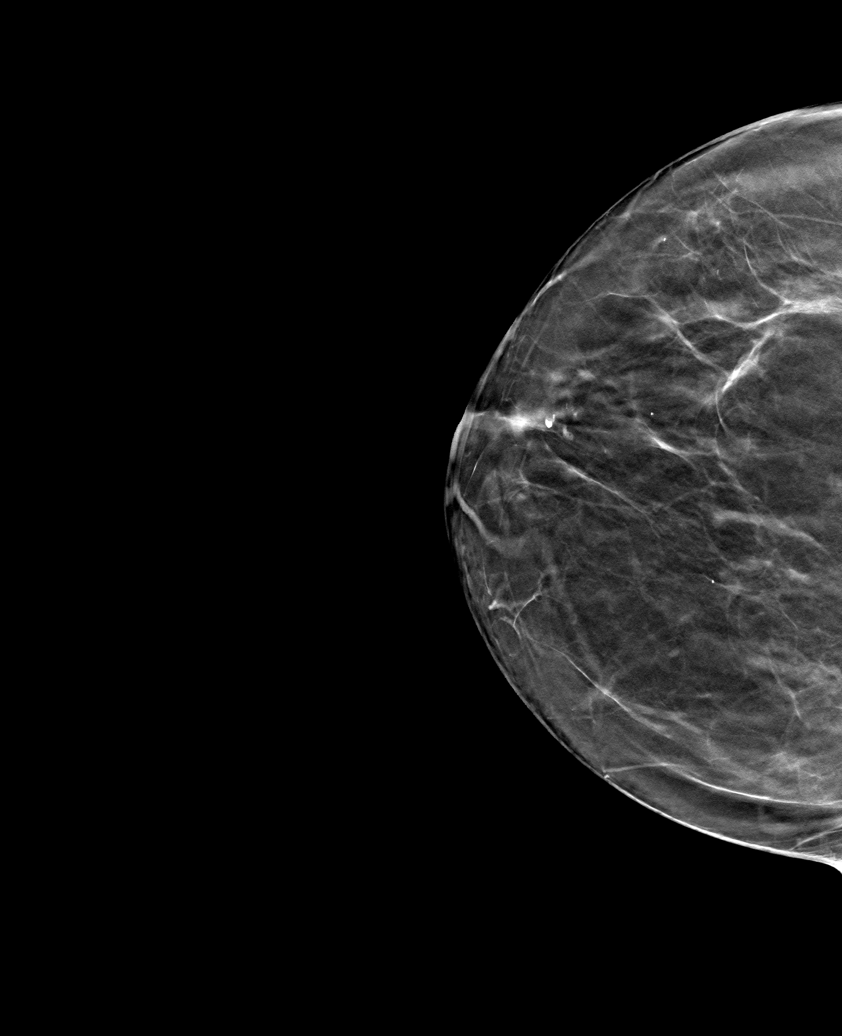

[6 of 30 positions shown; findings below may reference images not displayed]

ACR Breast Density Category b: There are scattered areas of
fibroglandular density.
FINDINGS: There are no findings suspicious for malignancy. Images were
processed with CAD.
IMPRESSION: No mammographic evidence of malignancy. A result letter of this
screening mammogram will be mailed directly to the patient.

RECOMMENDATION:
Screening mammogram in one year. (Code:[id number])

BI-RADS CATEGORY  1: Negative.

## 2020-02-15 ENCOUNTER — Telehealth: Payer: Self-pay | Admitting: Family Medicine

## 2020-02-15 NOTE — Telephone Encounter (Signed)
Copied from [place] [id number]. Topic: Medicare AWV >> Feb 15, 2020  2:55 PM Cher Nakai R wrote: Reason for CRM:   Left message for patient to call back and schedule Medicare Annual Wellness Visit (AWV) in office.   If not able to come in office, please offer to do virtually or by telephone.   Last AWV 01/03/2019  Please schedule at anytime with [facility] Health Advisor.  If any questions, please contact me at [phone]

## 2020-03-12 DIAGNOSIS — E039 Hypothyroidism, unspecified: Secondary | ICD-10-CM | POA: Diagnosis not present

## 2020-03-12 DIAGNOSIS — I10 Essential (primary) hypertension: Secondary | ICD-10-CM | POA: Diagnosis not present

## 2020-03-12 DIAGNOSIS — E1122 Type 2 diabetes mellitus with diabetic chronic kidney disease: Secondary | ICD-10-CM | POA: Diagnosis not present

## 2020-03-12 DIAGNOSIS — N181 Chronic kidney disease, stage 1: Secondary | ICD-10-CM | POA: Diagnosis not present

## 2020-03-12 DIAGNOSIS — E785 Hyperlipidemia, unspecified: Secondary | ICD-10-CM | POA: Diagnosis not present

## 2020-03-13 LAB — CBC WITH DIFFERENTIAL/PLATELET
Basophils Absolute: 0 10*3/uL (ref 0.0–0.2)
Basos: 0 %
EOS (ABSOLUTE): 0.1 10*3/uL (ref 0.0–0.4)
Eos: 2 %
Hematocrit: 40.3 % (ref 34.0–46.6)
Hemoglobin: 13 g/dL (ref 11.1–15.9)
Immature Grans (Abs): 0 10*3/uL (ref 0.0–0.1)
Immature Granulocytes: 0 %
Lymphocytes Absolute: 1.3 10*3/uL (ref 0.7–3.1)
Lymphs: 28 %
MCH: 26.7 pg (ref 26.6–33.0)
MCHC: 32.3 g/dL (ref 31.5–35.7)
MCV: 83 fL (ref 79–97)
Monocytes Absolute: 0.5 10*3/uL (ref 0.1–0.9)
Monocytes: 10 %
Neutrophils Absolute: 2.7 10*3/uL (ref 1.4–7.0)
Neutrophils: 60 %
Platelets: 393 10*3/uL (ref 150–450)
RBC: 4.87 x10E6/uL (ref 3.77–5.28)
RDW: 12.9 % (ref 11.7–15.4)
WBC: 4.6 10*3/uL (ref 3.4–10.8)

## 2020-03-13 LAB — COMPREHENSIVE METABOLIC PANEL
ALT: 8 IU/L (ref 0–32)
AST: 11 IU/L (ref 0–40)
Albumin/Globulin Ratio: 1.2 (ref 1.2–2.2)
Albumin: 3.7 g/dL (ref 3.7–4.7)
Alkaline Phosphatase: 80 IU/L (ref 44–121)
BUN/Creatinine Ratio: 14 (ref 12–28)
BUN: 13 mg/dL (ref 8–27)
Bilirubin Total: 0.4 mg/dL (ref 0.0–1.2)
CO2: 20 mmol/L (ref 20–29)
Calcium: 9.5 mg/dL (ref 8.7–10.3)
Chloride: 105 mmol/L (ref 96–106)
Creatinine, Ser: 0.94 mg/dL (ref 0.57–1.00)
GFR calc Af Amer: 67 mL/min/{1.73_m2} (ref >59)
GFR calc non Af Amer: 58 mL/min/{1.73_m2} — ABNORMAL LOW (ref >59)
Globulin, Total: 3.2 g/dL (ref 1.5–4.5)
Glucose: 114 mg/dL — ABNORMAL HIGH (ref 65–99)
Potassium: 4.1 mmol/L (ref 3.5–5.2)
Sodium: 141 mmol/L (ref 134–144)
Total Protein: 6.9 g/dL (ref 6.0–8.5)

## 2020-03-13 LAB — LIPID PANEL
Chol/HDL Ratio: 2.6 ratio (ref 0.0–4.4)
Cholesterol, Total: 148 mg/dL (ref 100–199)
HDL: 58 mg/dL (ref >39)
LDL Chol Calc (NIH): 75 mg/dL (ref 0–99)
Triglycerides: 75 mg/dL (ref 0–149)
VLDL Cholesterol Cal: 15 mg/dL (ref 5–40)

## 2020-03-13 LAB — HEMOGLOBIN A1C
Est. average glucose Bld gHb Est-mCnc: 128 mg/dL
Hgb A1c MFr Bld: 6.1 % — ABNORMAL HIGH (ref 4.8–5.6)

## 2020-03-13 LAB — TSH: TSH: 3.78 u[IU]/mL (ref 0.450–4.500)

## 2020-04-03 ENCOUNTER — Telehealth: Payer: Self-pay | Admitting: Family Medicine

## 2020-04-03 NOTE — Progress Notes (Deleted)
Annual Wellness Visit     Patient: Cynthia Castillo, Female    DOB: 25-Jul-1940, 80 y.o.   MRN: [id number] Visit Date: 04/04/2020  Today's Provider: [name], MD   No chief complaint on file.  Subjective    Cynthia Castillo is a 80 y.o. female who presents today for her Annual Wellness Visit. She reports consuming a {diet types:17450} diet. {Exercise:19826} She generally feels {well/fairly well/poorly:18703}. She reports sleeping {well/fairly well/poorly:18703}. She {does/does not:200015} have additional problems to discuss today.   HPI   {Show patient history (optional):23778::" "}   Medications: Outpatient Medications Prior to Visit  Medication Sig  . amLODipine (NORVASC) 5 MG tablet Take 1 tablet (5 mg total) by mouth daily.  Marland Kitchen atorvastatin (LIPITOR) 10 MG tablet Take 1 tablet (10 mg total) by mouth at bedtime.  Marland Kitchen glucose blood (BAYER CONTOUR NEXT TEST) test strip Once daily,fasting E11.40  . Lancets MISC 1 each by Does not apply route daily. Dx E11.40  . losartan-hydrochlorothiazide (HYZAAR) 100-25 MG tablet Take 1 tablet by mouth once daily  . Multiple Vitamins-Minerals (CENTRUM SILVER 50+WOMEN PO) Take by mouth daily.   . Omega-3 Fatty Acids (FISH OIL) 1200 MG CAPS Reported on 06/19/2015  . rosuvastatin (CRESTOR) 5 MG tablet Take 1 tablet by mouth once daily   No facility-administered medications prior to visit.    Allergies  Allergen Reactions  . Latex Anaphylaxis    Patient Care Team: Jerrol Banana., MD as PCP - General (Family Medicine) Bary Castilla, Forest Gleason, MD (General Surgery) Leandrew Koyanagi, MD as Referring Physician (Ophthalmology)  Review of Systems  All other systems reviewed and are negative.   {Labs  Heme  Chem  Endocrine  Serology  Results Review (optional):23779::" "}    Objective    Vitals: There were no vitals taken for this visit. {Show previous vital signs (optional):23777::" "}  Physical Exam ***  Most  recent functional status assessment: No flowsheet data found. Most recent fall risk assessment: Fall Risk  01/03/2019  Falls in the past year? 0  Number falls in past yr: 0  Injury with Fall? 0  Comment -  Follow up -    Most recent depression screenings: PHQ 2/9 Scores 01/03/2019 02/24/2018  PHQ - 2 Score 0 0  PHQ- 9 Score - 2   Most recent cognitive screening: 6CIT Screen 12/30/2017  What Year? 0 points  What month? 0 points  What time? 0 points  Count back from 20 0 points  Months in reverse 2 points  Repeat phrase 0 points  Total Score 2   Most recent Audit-C alcohol use screening Alcohol Use Disorder Test (AUDIT) 02/24/2018  1. How often do you have a drink containing alcohol? 0  2. How many drinks containing alcohol do you have on a typical day when you are drinking? 0  3. How often do you have six or more drinks on one occasion? 0  AUDIT-C Score 0   A score of 3 or more in women, and 4 or more in men indicates increased risk for alcohol abuse, EXCEPT if all of the points are from question 1   No results found for any visits on 04/04/20.  Assessment & Plan     Annual wellness visit done today including the all of the following: Reviewed patient's Family Medical History Reviewed and updated list of patient's medical providers Assessment of cognitive impairment was done Assessed patient's functional ability Established a written schedule for health  screening services Health Risk Assessent Completed and Reviewed  Exercise Activities and Dietary recommendations Goals    .  DIET - INCREASE WATER INTAKE (pt-stated)      Recommend increasing water intake to 4-6 glasses a day.        Immunization History  Administered Date(s) Administered  . Fluad Quad(high Dose 65+) 01/03/2019  . Influenza, High Dose Seasonal PF 10/23/2014, 09/17/2015, 12/23/2016, 12/30/2017  . Pneumococcal Conjugate-13 08/16/2013  . Pneumococcal Polysaccharide-23 11/20/2010  . Td 08/25/2003  .  Tdap 10/20/2012  . Zoster 10/20/2012    Health Maintenance  Topic Date Due  . COVID-19 Vaccine (1) Never done  . FOOT EXAM  12/02/2017  . OPHTHALMOLOGY EXAM  04/09/2019  . INFLUENZA VACCINE  08/20/2020 (Originally 08/21/2019)  . HEMOGLOBIN A1C  09/09/2020  . TETANUS/TDAP  10/21/2022  . DEXA SCAN  Completed  . PNA vac Low Risk Adult  Completed  . HPV VACCINES  Aged Out     Discussed health benefits of physical activity, and encouraged her to engage in regular exercise appropriate for her age and condition.    ***  No follow-ups on file.     {provider attestation***:1}   [name], MD  [facility] [phone] (phone) [phone] (fax)  [place]

## 2020-04-03 NOTE — Telephone Encounter (Signed)
Copied from [place] [id number]. Topic: Medicare AWV >> Apr 03, 2020 10:54 AM Cher Nakai R wrote: Reason for CRM:   No answer unable to leave a message for patient to call back and schedule Medicare Annual Wellness Visit (AWV) in office.   If not able to come in office, please offer to do virtually or by telephone.   Last AWV: 01/03/2019  Please schedule at anytime with [facility] Health Advisor.  If any questions, please contact me at [phone]

## 2020-04-04 ENCOUNTER — Encounter: Payer: Self-pay | Admitting: Family Medicine

## 2020-06-22 ENCOUNTER — Other Ambulatory Visit: Payer: Self-pay | Admitting: Family Medicine

## 2020-06-22 DIAGNOSIS — I10 Essential (primary) hypertension: Secondary | ICD-10-CM

## 2020-06-22 NOTE — Telephone Encounter (Signed)
   Notes to clinic:  this script has expired  Review for continued use and refill   Requested Prescriptions  Pending Prescriptions Disp Refills   losartan-hydrochlorothiazide (HYZAAR) 100-25 MG tablet [Pharmacy Med Name: Losartan Potassium-HCTZ 100-25 MG Oral Tablet] 90 tablet 0    Sig: Take 1 tablet by mouth once daily      Cardiovascular: ARB + Diuretic Combos Failed - 06/22/2020 12:58 PM      Failed - Last BP in normal range    BP Readings from Last 1 Encounters:  10/17/19 (!) 189/69          Failed - Valid encounter within last 6 months    Recent Outpatient Visits           8 months ago Type 2 diabetes mellitus with stage 1 chronic kidney disease, without long-term current use of insulin ([place])   [facility] Jerrol Banana., MD   2 years ago Encounter for annual physical exam   [facility] Jerrol Banana., MD   2 years ago Type 2 diabetes mellitus with stage 1 chronic kidney disease, without long-term current use of insulin [facility])   [facility] Jerrol Banana., MD   3 years ago [facility] Jerrol Banana., MD   3 years ago Essential (primary) hypertension   [facility] Jerrol Banana., MD                Passed - K in normal range and within 180 days    Potassium  Date Value Ref Range Status  03/12/2020 4.1 3.5 - 5.2 mmol/L Final          Passed - Na in normal range and within 180 days    Sodium  Date Value Ref Range Status  03/12/2020 141 134 - 144 mmol/L Final          Passed - Cr in normal range and within 180 days    Creat  Date Value Ref Range Status  12/12/2016 0.85 0.60 - 0.93 mg/dL Final    Comment:    For patients >45 years of age, the reference limit for Creatinine is approximately 13% higher for people identified as African-American. .    Creatinine, Ser  Date Value Ref Range Status  03/12/2020 0.94 0.57 - 1.00 mg/dL Final     Comment:                   **Effective March 19, 2020 [facility] will begin**                  reporting the 2021 CKD-EPI creatinine equation that                  estimates kidney function without a race variable.           Passed - Ca in normal range and within 180 days    Calcium  Date Value Ref Range Status  03/12/2020 9.5 8.7 - 10.3 mg/dL Final   Calcium, Ion  Date Value Ref Range Status  01/28/2016 5.4 4.5 - 5.6 mg/dL Final          Passed - Patient is not pregnant

## 2020-09-05 ENCOUNTER — Ambulatory Visit: Payer: Medicare HMO | Admitting: Family Medicine

## 2020-09-05 NOTE — Progress Notes (Deleted)
Established patient visit   Patient: Cynthia Castillo   DOB: 12-Dec-1940   80 y.o. Female  MRN: [id number] Visit Date: 09/05/2020  Today's healthcare provider: [name], MD   No chief complaint on file.  Subjective  -------------------------------------------------------------------------------------------------------------------- HPI  Diabetes Mellitus Type II, follow-up  Lab Results  Component Value Date   HGBA1C 6.1 (H) 03/12/2020   HGBA1C 6.5 (H) 03/30/2018   HGBA1C 6.4 (A) 02/24/2018   Last seen for diabetes 11 months ago.  Management since then includes continuing the same treatment. She reports {excellent/good/fair/poor:19665} compliance with treatment. She {is/is not:21021397} having side effects. {document side effects if present:1}  Home blood sugar records: {diabetes glucometry results:16657}  Episodes of hypoglycemia? {Yes/No:20286} {enter details if yes:1}   Current insulin regiment: {***Type 'None' if not taking insulin                                                otherwise enter complete                                                 details of insulin regiment:1} Most Recent Eye Exam: ***  --------------------------------------------------------------------------------------------------- Hypertension, follow-up  BP Readings from Last 3 Encounters:  10/17/19 (!) 189/69  01/03/19 138/72  02/24/18 118/68   Wt Readings from Last 3 Encounters:  10/17/19 167 lb (75.8 kg)  01/03/19 165 lb 12.8 oz (75.2 kg)  02/24/18 163 lb 3.2 oz (74 kg)     She was last seen for hypertension 11 months ago.  BP at that visit was 189/69. Management since that visit includes; Check blood pressure at home and follow-up in 3 to 4 months. She reports {excellent/good/fair/poor:19665} compliance with treatment. She {is/is not:9024} having side effects. {document side effects if present:1} She {is/is not:9024} exercising. She {is/is not:9024} adherent to low  salt diet.   Outside blood pressures are {enter patient reported home BP, or 'not being checked':1}.  She {does/does not:200015} smoke.  Use of agents associated with hypertension: {bp agents assoc with hypertension:511::"none"}.   --------------------------------------------------------------------------------------------------- Lipid/Cholesterol, follow-up  Last Lipid Panel: Lab Results  Component Value Date   CHOL 148 03/12/2020   LDLCALC 75 03/12/2020   HDL 58 03/12/2020   TRIG 75 03/12/2020    She was last seen for this 6 months ago.  Management since that visit includes; atorvastatin.  She reports {excellent/good/fair/poor:19665} compliance with treatment. She {is/is not:9024} having side effects. {document side effects if present:1}  She is following a {diet:21022986} diet. Current exercise: {exercise 99991111  Last metabolic panel Lab Results  Component Value Date   GLUCOSE 114 (H) 03/12/2020   NA 141 03/12/2020   K 4.1 03/12/2020   BUN 13 03/12/2020   CREATININE 0.94 03/12/2020   GFRNONAA 58 (L) 03/12/2020   GFRAA 67 03/12/2020   CALCIUM 9.5 03/12/2020   AST 11 03/12/2020   ALT 8 03/12/2020   The 10-year ASCVD risk score Mikey Bussing DC Jr., et al., 2013) is: 44.5%  --------------------------------------------------------------------------------------------------- Hypothyroid, follow-up  Lab Results  Component Value Date   TSH 3.780 03/12/2020   TSH 2.130 03/30/2018   TSH 2.82 12/12/2016   Wt Readings from Last 3 Encounters:  10/17/19 167 lb (75.8 kg)  01/03/19 165 lb 12.8 oz (75.2 kg)  02/24/18 163 lb 3.2 oz (74 kg)    She was last seen for hypothyroid 11 months ago.  Management since that visit includes; not on a medication. She reports {excellent/good/fair/poor:19665} compliance with treatment. She {is/is not:21021397} having side effects. {document side effects if  present:1}  -----------------------------------------------------------------------------------------   {Show patient history (optional):23778}   Medications: Outpatient Medications Prior to Visit  Medication Sig   amLODipine (NORVASC) 5 MG tablet Take 1 tablet (5 mg total) by mouth daily.   atorvastatin (LIPITOR) 10 MG tablet Take 1 tablet (10 mg total) by mouth at bedtime.   glucose blood (BAYER CONTOUR NEXT TEST) test strip Once daily,fasting E11.40   Lancets MISC 1 each by Does not apply route daily. Dx E11.40   losartan-hydrochlorothiazide (HYZAAR) 100-25 MG tablet Take 1 tablet by mouth once daily   Multiple Vitamins-Minerals (CENTRUM SILVER 50+WOMEN PO) Take by mouth daily.    Omega-3 Fatty Acids (FISH OIL) 1200 MG CAPS Reported on 06/19/2015   rosuvastatin (CRESTOR) 5 MG tablet Take 1 tablet by mouth once daily   No facility-administered medications prior to visit.    Review of Systems  Constitutional:  Negative for appetite change, chills, fatigue and fever.  Respiratory:  Negative for chest tightness and shortness of breath.   Cardiovascular:  Negative for chest pain and palpitations.  Gastrointestinal:  Negative for abdominal pain, nausea and vomiting.  Neurological:  Negative for dizziness and weakness.   {Labs  Heme  Chem  Endocrine  Serology  Results Review (optional):23779}   Objective  -------------------------------------------------------------------------------------------------------------------- There were no vitals taken for this visit. {Show previous vital signs (optional):23777}   Physical Exam  ***  No results found for any visits on 09/05/20.  Assessment & Plan  ---------------------------------------------------------------------------------------------------------------------- ***  No follow-ups on file.      {provider attestation***:1}   [name], MD  [facility] [phone] (phone) [phone]  (fax)  [place]

## 2020-09-12 ENCOUNTER — Telehealth: Payer: Self-pay

## 2020-09-12 NOTE — Telephone Encounter (Signed)
Copied from [place] [id number]. Topic: General - Other >> Sep 12, 2020  4:40 PM Pawlus, Brayton Layman A wrote: Reason for CRM: Pts daugher Baruch Goldmann [phone] called and was concerned about her mother, asked if Dr Rosanna Randy would check her hands and feet during her appt tomorrow.

## 2020-09-13 ENCOUNTER — Ambulatory Visit (INDEPENDENT_AMBULATORY_CARE_PROVIDER_SITE_OTHER): Payer: Medicare HMO | Admitting: Family Medicine

## 2020-09-13 ENCOUNTER — Encounter: Payer: Self-pay | Admitting: Family Medicine

## 2020-09-13 ENCOUNTER — Other Ambulatory Visit: Payer: Self-pay

## 2020-09-13 VITALS — BP 146/82 | HR 60 | Temp 97.5°F | Wt 165.0 lb

## 2020-09-13 DIAGNOSIS — E1122 Type 2 diabetes mellitus with diabetic chronic kidney disease: Secondary | ICD-10-CM | POA: Diagnosis not present

## 2020-09-13 DIAGNOSIS — Z6831 Body mass index (BMI) 31.0-31.9, adult: Secondary | ICD-10-CM | POA: Diagnosis not present

## 2020-09-13 DIAGNOSIS — L819 Disorder of pigmentation, unspecified: Secondary | ICD-10-CM | POA: Diagnosis not present

## 2020-09-13 DIAGNOSIS — E6609 Other obesity due to excess calories: Secondary | ICD-10-CM | POA: Diagnosis not present

## 2020-09-13 DIAGNOSIS — N181 Chronic kidney disease, stage 1: Secondary | ICD-10-CM

## 2020-09-13 DIAGNOSIS — E785 Hyperlipidemia, unspecified: Secondary | ICD-10-CM

## 2020-09-13 DIAGNOSIS — M170 Bilateral primary osteoarthritis of knee: Secondary | ICD-10-CM | POA: Diagnosis not present

## 2020-09-13 DIAGNOSIS — I10 Essential (primary) hypertension: Secondary | ICD-10-CM | POA: Diagnosis not present

## 2020-09-13 NOTE — Telephone Encounter (Signed)
FYI. Thanks.

## 2020-09-13 NOTE — Patient Instructions (Signed)
Stop Rosuvastatin and only take Atorvastatin.   Don't forget to get you Covid Booster vaccine.

## 2020-09-13 NOTE — Progress Notes (Signed)
Established patient visit   Patient: Cynthia Castillo   DOB: [birth year]   80 y.o. Female  MRN: [id number] Visit Date: 09/13/2020  Today's healthcare provider: [name], MD   Chief Complaint  Patient presents with   Hyperlipidemia   Hypertension   Diabetes   Subjective  -------------------------------------------------------------------------------------------------------------------- HPI  Patient has not been in for a year and a half comes in today for follow-up for her refills.  Overall she feels well.  The only issue that she has is some hyperpigmentation of the proximal part of the dorsum of her hands.  This is the proximal fingers.  It is not tender and no wounds. She denies any falls.  She does walk with a cane.  Diabetes Mellitus Type II, follow-up  Lab Results  Component Value Date   HGBA1C 6.1 (H) 03/12/2020   HGBA1C 6.5 (H) 03/30/2018   HGBA1C 6.4 (A) 02/24/2018   Last seen for diabetes 6 months ago.  Management since then includes continuing the same treatment. She reports excellent compliance with treatment. She is not having side effects.   Home blood sugar records:  are not being checked  Episodes of hypoglycemia? No    Current insulin regiment: none Most Recent Eye Exam: pt is due for an eye exam  --------------------------------------------------------------------------------------------------- Hypertension, follow-up  BP Readings from Last 3 Encounters:  09/13/20 (!) 146/82  10/17/19 (!) 189/69  01/03/19 138/72   Wt Readings from Last 3 Encounters:  09/13/20 165 lb (74.8 kg)  10/17/19 167 lb (75.8 kg)  01/03/19 165 lb 12.8 oz (75.2 kg)     She was last seen for hypertension 6 months ago.  Management since that visit includes no changes. She reports excellent compliance with treatment. She is not having side effects.  She is exercising. She is adherent to low salt diet.   Outside blood pressures are normal at home.  She does  not smoke.  Use of agents associated with hypertension: none.   --------------------------------------------------------------------------------------------------- Lipid/Cholesterol, follow-up  Last Lipid Panel: Lab Results  Component Value Date   CHOL 148 03/12/2020   LDLCALC 75 03/12/2020   HDL 58 03/12/2020   TRIG 75 03/12/2020    She was last seen for this 6 months ago.  Management since that visit includes no changes.  She reports excellent compliance with treatment. She is not having side effects.   Symptoms: No appetite changes No foot ulcerations  No chest pain No chest pressure/discomfort  No dyspnea No orthopnea  No fatigue No lower extremity edema  No palpitations No paroxysmal nocturnal dyspnea  No nausea No numbness or tingling of extremity  No polydipsia No polyuria  No speech difficulty No syncope    Last metabolic panel Lab Results  Component Value Date   GLUCOSE 114 (H) 03/12/2020   NA 141 03/12/2020   K 4.1 03/12/2020   BUN 13 03/12/2020   CREATININE 0.94 03/12/2020   GFRNONAA 58 (L) 03/12/2020   GFRAA 67 03/12/2020   CALCIUM 9.5 03/12/2020   AST 11 03/12/2020   ALT 8 03/12/2020   The 10-year ASCVD risk score Mikey Bussing DC Jr., et al., 2013) is: 34.6%  ---------------------------------------------------------------------------------------------------      Medications: Outpatient Medications Prior to Visit  Medication Sig   amLODipine (NORVASC) 5 MG tablet Take 1 tablet (5 mg total) by mouth daily.   atorvastatin (LIPITOR) 10 MG tablet Take 1 tablet (10 mg total) by mouth at bedtime.   glucose blood (BAYER CONTOUR NEXT TEST)  test strip Once daily,fasting E11.40   Lancets MISC 1 each by Does not apply route daily. Dx E11.40   losartan-hydrochlorothiazide (HYZAAR) 100-25 MG tablet Take 1 tablet by mouth once daily   Multiple Vitamins-Minerals (CENTRUM SILVER 50+WOMEN PO) Take by mouth daily.    Omega-3 Fatty Acids (FISH OIL) 1200 MG CAPS  Reported on 06/19/2015   rosuvastatin (CRESTOR) 5 MG tablet Take 1 tablet by mouth once daily   No facility-administered medications prior to visit.    Review of Systems  Constitutional: Negative.   Respiratory: Negative.    Cardiovascular: Negative.   Gastrointestinal: Negative.   Endocrine: Negative.   Skin:  Positive for color change (Bilateral hands). Negative for wound.  Neurological:  Negative for light-headedness.      Objective  -------------------------------------------------------------------------------------------------------------------- BP (!) 146/82 (BP Location: Right Arm, Patient Position: Sitting, Cuff Size: Normal)   Pulse 60   Temp (!) 97.5 F (36.4 C) (Temporal)   Wt 165 lb (74.8 kg)   BMI 31.18 kg/m     Physical Exam Vitals reviewed.  Constitutional:      Appearance: She is well-developed.  HENT:     Head: Normocephalic and atraumatic.  Eyes:     General: No scleral icterus.    Conjunctiva/sclera: Conjunctivae normal.  Neck:     Thyroid: No thyromegaly.  Cardiovascular:     Rate and Rhythm: Normal rate and regular rhythm.     Heart sounds: Normal heart sounds.  Pulmonary:     Effort: Pulmonary effort is normal.     Breath sounds: Normal breath sounds.  Abdominal:     Palpations: Abdomen is soft.  Musculoskeletal:     Comments: Patient walks with a cane.  Skin:    General: Skin is warm and dry.  Neurological:     General: No focal deficit present.     Mental Status: She is alert and oriented to person, place, and time.  Psychiatric:        Mood and Affect: Mood normal.        Behavior: Behavior normal.        Thought Content: Thought content normal.        Judgment: Judgment normal.      No results found for any visits on 09/13/20.  Assessment & Plan  ----------------------------------------------------------------------------------------------------------------------    1. Essential (primary) hypertension Good control on  losartan HCT. - CBC - TSH  2. Hyperlipidemia, unspecified hyperlipidemia type No she is on atorvastatin but she also has rosuvastatin on her list.  At this time we will make sure we stop rosuvastatin as she did not should not be on both. - CBC - Comprehensive metabolic panel - Lipid panel  3. Type 2 diabetes mellitus with stage 1 chronic kidney disease, without long-term current use of insulin (HCC) Goal A1c less than 7.5.  She is on lifestyle only - CBC - Hemoglobin A1c  4. Hyperpigmentation For pigmentation on the back of her proximal fingers.  For to dermatology - Ambulatory referral to Dermatology  5. Primary osteoarthritis of both knees Walks with a cane  6. Class 1 obesity due to excess calories with serious comorbidity and body mass index (BMI) of 31.0 to 31.9 in adult  , [name], MD, have reviewed all documentation for this visit. The documentation on 09/21/20 for the exam, diagnosis, procedures, and orders are all accurate and complete.   No follow-ups on file.         Axcel Horsch Cranford Mon, MD  [facility] [phone] (phone) [phone] (fax)  [place]

## 2020-09-14 LAB — COMPREHENSIVE METABOLIC PANEL
ALT: 7 IU/L (ref 0–32)
AST: 15 IU/L (ref 0–40)
Albumin/Globulin Ratio: 1.3 (ref 1.2–2.2)
Albumin: 4.3 g/dL (ref 3.7–4.7)
Alkaline Phosphatase: 87 IU/L (ref 44–121)
BUN/Creatinine Ratio: 11 — ABNORMAL LOW (ref 12–28)
BUN: 11 mg/dL (ref 8–27)
Bilirubin Total: 0.4 mg/dL (ref 0.0–1.2)
CO2: 22 mmol/L (ref 20–29)
Calcium: 9.7 mg/dL (ref 8.7–10.3)
Chloride: 105 mmol/L (ref 96–106)
Creatinine, Ser: 0.99 mg/dL (ref 0.57–1.00)
Globulin, Total: 3.4 g/dL (ref 1.5–4.5)
Glucose: 86 mg/dL (ref 65–99)
Potassium: 4.5 mmol/L (ref 3.5–5.2)
Sodium: 141 mmol/L (ref 134–144)
Total Protein: 7.7 g/dL (ref 6.0–8.5)
eGFR: 58 mL/min/{1.73_m2} — ABNORMAL LOW (ref >59)

## 2020-09-14 LAB — LIPID PANEL
Chol/HDL Ratio: 3.3 ratio (ref 0.0–4.4)
Cholesterol, Total: 198 mg/dL (ref 100–199)
HDL: 60 mg/dL (ref >39)
LDL Chol Calc (NIH): 120 mg/dL — ABNORMAL HIGH (ref 0–99)
Triglycerides: 103 mg/dL (ref 0–149)
VLDL Cholesterol Cal: 18 mg/dL (ref 5–40)

## 2020-09-14 LAB — CBC
Hematocrit: 42.2 % (ref 34.0–46.6)
Hemoglobin: 13.3 g/dL (ref 11.1–15.9)
MCH: 25 pg — ABNORMAL LOW (ref 26.6–33.0)
MCHC: 31.5 g/dL (ref 31.5–35.7)
MCV: 80 fL (ref 79–97)
Platelets: 335 10*3/uL (ref 150–450)
RBC: 5.31 x10E6/uL — ABNORMAL HIGH (ref 3.77–5.28)
RDW: 15.2 % (ref 11.7–15.4)
WBC: 4.4 10*3/uL (ref 3.4–10.8)

## 2020-09-14 LAB — TSH: TSH: 2.82 u[IU]/mL (ref 0.450–4.500)

## 2020-09-14 LAB — HEMOGLOBIN A1C
Est. average glucose Bld gHb Est-mCnc: 120 mg/dL
Hgb A1c MFr Bld: 5.8 % — ABNORMAL HIGH (ref 4.8–5.6)

## 2020-09-17 ENCOUNTER — Ambulatory Visit: Payer: Medicare HMO | Admitting: Family Medicine

## 2021-02-14 ENCOUNTER — Encounter: Payer: Medicare HMO | Admitting: Family Medicine

## 2021-02-14 NOTE — Progress Notes (Deleted)
Annual Wellness Visit     Patient: Cynthia Castillo, Female    DOB: 04-24-1940, 81 y.o.   MRN: [id number] Visit Date: 02/14/2021  Today's Provider: [name], MD   No chief complaint on file.  Subjective    Cynthia Castillo is a 81 y.o. female who presents today for her Annual Wellness Visit. She reports consuming a {diet types:17450} diet. {Exercise:19826} She generally feels {well/fairly well/poorly:18703}. She reports sleeping {well/fairly well/poorly:18703}. She {does/does not:200015} have additional problems to discuss today.   HPI    Medications: Outpatient Medications Prior to Visit  Medication Sig   amLODipine (NORVASC) 5 MG tablet Take 1 tablet (5 mg total) by mouth daily.   atorvastatin (LIPITOR) 10 MG tablet Take 1 tablet (10 mg total) by mouth at bedtime.   glucose blood (BAYER CONTOUR NEXT TEST) test strip Once daily,fasting E11.40   Lancets MISC 1 each by Does not apply route daily. Dx E11.40   losartan-hydrochlorothiazide (HYZAAR) 100-25 MG tablet Take 1 tablet by mouth once daily   Multiple Vitamins-Minerals (CENTRUM SILVER 50+WOMEN PO) Take by mouth daily.    Omega-3 Fatty Acids (FISH OIL) 1200 MG CAPS Reported on 06/19/2015   rosuvastatin (CRESTOR) 5 MG tablet Take 1 tablet by mouth once daily   No facility-administered medications prior to visit.    Allergies  Allergen Reactions   Latex Anaphylaxis    Patient Care Team: Jerrol Banana., MD as PCP - General (Family Medicine) Bary Castilla, Forest Gleason, MD (General Surgery) Leandrew Koyanagi, MD as Referring Physician (Ophthalmology)  Review of Systems  All other systems reviewed and are negative.  {Labs   Heme   Chem   Endocrine   Serology   Results Review (optional):23779}    Objective    Vitals: There were no vitals taken for this visit. {Show previous vital signs (optional):23777}  Physical Exam ***  Most recent functional status assessment: In your present state of  health, do you have any difficulty performing the following activities: 09/13/2020  Hearing? N  Vision? N  Difficulty concentrating or making decisions? N  Walking or climbing stairs? N  Dressing or bathing? Y  Doing errands, shopping? N  Some recent data might be hidden   Most recent fall risk assessment: Fall Risk  09/13/2020  Falls in the past year? 1  Number falls in past yr: 1  Injury with Fall? 0  Comment -  Risk for fall due to : History of fall(s)  Follow up Falls evaluation completed    Most recent depression screenings: PHQ 2/9 Scores 09/13/2020 01/03/2019  PHQ - 2 Score 0 0  PHQ- 9 Score 1 -   Most recent cognitive screening: 6CIT Screen 12/30/2017  What Year? 0 points  What month? 0 points  What time? 0 points  Count back from 20 0 points  Months in reverse 2 points  Repeat phrase 0 points  Total Score 2   Most recent Audit-C alcohol use screening Alcohol Use Disorder Test (AUDIT) 09/13/2020  1. How often do you have a drink containing alcohol? 0  2. How many drinks containing alcohol do you have on a typical day when you are drinking? 0  3. How often do you have six or more drinks on one occasion? 0  AUDIT-C Score 0   A score of 3 or more in women, and 4 or more in men indicates increased risk for alcohol abuse, EXCEPT if all of the points are from question 1  No results found for any visits on 02/14/21.  Assessment & Plan     Annual wellness visit done today including the all of the following: Reviewed patient's Family Medical History Reviewed and updated list of patient's medical providers Assessment of cognitive impairment was done Assessed patient's functional ability Established a written schedule for health screening [place] Completed and Reviewed  Exercise Activities and Dietary recommendations  Goals       DIET - INCREASE WATER INTAKE (pt-stated)      Recommend increasing water intake to 4-6 glasses a day.          Immunization History  Administered Date(s) Administered   Fluad Quad(high Dose 65+) 01/03/2019   Influenza, High Dose Seasonal PF 10/23/2014, 09/17/2015, 12/23/2016, 12/30/2017   Pneumococcal Conjugate-13 08/16/2013   Pneumococcal Polysaccharide-23 11/20/2010   Td 08/25/2003   Tdap 10/20/2012   Zoster, Live 10/20/2012    Health Maintenance  Topic Date Due   COVID-19 Vaccine (1) Never done   Zoster Vaccines- Shingrix (1 of 2) Never done   FOOT EXAM  12/02/2017   OPHTHALMOLOGY EXAM  04/09/2019   INFLUENZA VACCINE  08/20/2020   HEMOGLOBIN A1C  03/16/2021   TETANUS/TDAP  10/21/2022   Pneumonia Vaccine 35+ Years old  Completed   DEXA SCAN  Completed   HPV VACCINES  Aged Out     Discussed health benefits of physical activity, and encouraged her to engage in regular exercise appropriate for her age and condition.    ***  No follow-ups on file.     {provider attestation***:1}   [name], MD  [facility] [phone] (phone) [phone] (fax)  [place]

## 2021-02-28 DIAGNOSIS — E119 Type 2 diabetes mellitus without complications: Secondary | ICD-10-CM | POA: Diagnosis not present

## 2021-04-10 ENCOUNTER — Other Ambulatory Visit: Payer: Self-pay

## 2021-04-10 ENCOUNTER — Ambulatory Visit (INDEPENDENT_AMBULATORY_CARE_PROVIDER_SITE_OTHER): Payer: Medicare HMO | Admitting: Family Medicine

## 2021-04-10 ENCOUNTER — Encounter: Payer: Self-pay | Admitting: Family Medicine

## 2021-04-10 VITALS — BP 147/77 | HR 78 | Temp 98.3°F | Resp 16 | Ht 61.0 in | Wt 160.0 lb

## 2021-04-10 DIAGNOSIS — E7800 Pure hypercholesterolemia, unspecified: Secondary | ICD-10-CM | POA: Diagnosis not present

## 2021-04-10 DIAGNOSIS — Z6831 Body mass index (BMI) 31.0-31.9, adult: Secondary | ICD-10-CM | POA: Diagnosis not present

## 2021-04-10 DIAGNOSIS — M1990 Unspecified osteoarthritis, unspecified site: Secondary | ICD-10-CM | POA: Diagnosis not present

## 2021-04-10 DIAGNOSIS — E6609 Other obesity due to excess calories: Secondary | ICD-10-CM | POA: Diagnosis not present

## 2021-04-10 DIAGNOSIS — J309 Allergic rhinitis, unspecified: Secondary | ICD-10-CM | POA: Diagnosis not present

## 2021-04-10 DIAGNOSIS — E1122 Type 2 diabetes mellitus with diabetic chronic kidney disease: Secondary | ICD-10-CM | POA: Diagnosis not present

## 2021-04-10 DIAGNOSIS — G3184 Mild cognitive impairment, so stated: Secondary | ICD-10-CM | POA: Diagnosis not present

## 2021-04-10 DIAGNOSIS — I10 Essential (primary) hypertension: Secondary | ICD-10-CM | POA: Diagnosis not present

## 2021-04-10 DIAGNOSIS — E039 Hypothyroidism, unspecified: Secondary | ICD-10-CM

## 2021-04-10 DIAGNOSIS — N181 Chronic kidney disease, stage 1: Secondary | ICD-10-CM | POA: Diagnosis not present

## 2021-04-10 LAB — POCT GLYCOSYLATED HEMOGLOBIN (HGB A1C)
Est. average glucose Bld gHb Est-mCnc: 120
Hemoglobin A1C: 5.8 % — AB (ref 4.0–5.6)

## 2021-04-10 MED ORDER — BLOOD GLUCOSE METER KIT
PACK | 0 refills | Status: DC
Start: 2021-04-10 — End: 2022-02-17

## 2021-04-10 NOTE — Progress Notes (Signed)
?  ? ? ?Established patient visit ? ?I,Cynthia Castillo,acting as a scribe for Cynthia Durie, MD.,have documented all relevant documentation on the behalf of Cynthia Durie, MD,as directed by  Cynthia Durie, MD while in the presence of Cynthia Durie, MD. ? ? ?Patient: Cynthia Castillo   DOB: 1940/05/05   81 y.o. Female  MRN: [id number] ?Visit Date: 04/10/2021 ? ?Today's healthcare provider: [name], MD  ? ?Chief Complaint  ?Patient presents with  ? Follow-up  ? Diabetes  ? Hypertension  ? ?Subjective  ?  ?HPI  ?Patient comes in today for follow-up of chronic medical problems.  She states the only new issue is some mild memory loss that she notices recently.  No driving problems, she does not get lost, she has not left the cook up on. ?Overall she feels well and is tolerating her medications. ?She walks with a cane and has had no falls recently. ?Diabetes Mellitus Type II, follow-up ? ?Lab Results  ?Component Value Date  ? HGBA1C 5.8 (A) 04/10/2021  ? HGBA1C 5.8 (H) 09/13/2020  ? HGBA1C 6.1 (H) 03/12/2020  ? ?Last seen for diabetes 7 months ago.  ?Management since then includes continuing the same treatment. ?She reports good compliance with treatment. ?She is not having side effects. none ? ?Home blood sugar records: fasting range: not checking ? ?Episodes of hypoglycemia? No none ?  ?Current insulin regiment: n/a ?Most Recent Eye Exam: has appt with [facility] coming up ? ?--------------------------------------------------------------------------------------------------- ?Hypertension, follow-up ? ?BP Readings from Last 3 Encounters:  ?04/10/21 (!) 147/77  ?09/13/20 (!) 146/82  ?10/17/19 (!) 189/69  ? Wt Readings from Last 3 Encounters:  ?04/10/21 160 lb (72.6 kg)  ?09/13/20 165 lb (74.8 kg)  ?10/17/19 167 lb (75.8 kg)  ?  ? ?She was last seen for hypertension 7 months ago.  ?BP at that visit was 146/82. ?Management since that visit includes; on amlodipine and  losartan-hydrochlorothiazide. ?She reports good compliance with treatment. ?She is not having side effects. none ?She is not exercising. ?She is adherent to low salt diet.   ?Outside blood pressures are fasting range: not checking. ? ?She does not smoke. ? ?Use of agents associated with hypertension: none.  ? ?-------------------------------------------------------------------------------------------------- ? ? ?Medications: ?Outpatient Medications Prior to Visit  ?Medication Sig  ? amLODipine (NORVASC) 5 MG tablet Take 1 tablet (5 mg total) by mouth daily.  ? glucose blood (BAYER CONTOUR NEXT TEST) test strip Once daily,fasting E11.40  ? Lancets MISC 1 each by Does not apply route daily. Dx E11.40  ? losartan-hydrochlorothiazide (HYZAAR) 100-25 MG tablet Take 1 tablet by mouth once daily  ? Multiple Vitamins-Minerals (CENTRUM SILVER 50+WOMEN PO) Take by mouth daily.   ? Omega-3 Fatty Acids (FISH OIL) 1200 MG CAPS Reported on 06/19/2015  ? rosuvastatin (CRESTOR) 5 MG tablet Take 1 tablet by mouth once daily  ? [DISCONTINUED] atorvastatin (LIPITOR) 10 MG tablet Take 1 tablet (10 mg total) by mouth at bedtime. (Patient not taking: Reported on 04/10/2021)  ? ?No facility-administered medications prior to visit.  ? ? ?Review of Systems  ?Constitutional:  Negative for appetite change, chills, fatigue and fever.  ?Respiratory:  Negative for chest tightness and shortness of breath.   ?Cardiovascular:  Negative for chest pain and palpitations.  ?Gastrointestinal:  Negative for abdominal pain, nausea and vomiting.  ?Neurological:  Negative for dizziness and weakness.  ? ?Last hemoglobin A1c ?Lab Results  ?Component Value Date  ? HGBA1C 5.8 (A) 04/10/2021  ? ?  ?  Objective  ?  ?BP (!) 147/77 (BP Location: Left Arm, Patient Position: Sitting, Cuff Size: Normal)   Pulse 78   Temp 98.3 ?F (36.8 ?C) (Temporal)   Resp 16   Ht _0  (1.549 m)   Wt 160 lb (72.6 kg)   SpO2 97%   BMI 30.23 kg/m?  ?BP Readings from Last 3  Encounters:  ?04/10/21 (!) 147/77  ?09/13/20 (!) 146/82  ?10/17/19 (!) 189/69  ? ?Wt Readings from Last 3 Encounters:  ?04/10/21 160 lb (72.6 kg)  ?09/13/20 165 lb (74.8 kg)  ?10/17/19 167 lb (75.8 kg)  ? ?  ? ?Physical Exam ?Vitals reviewed.  ?Constitutional:   ?   Appearance: She is well-developed.  ?HENT:  ?   Head: Normocephalic and atraumatic.  ?Eyes:  ?   General: No scleral icterus. ?   Conjunctiva/sclera: Conjunctivae normal.  ?Neck:  ?   Thyroid: No thyromegaly.  ?Cardiovascular:  ?   Rate and Rhythm: Normal rate and regular rhythm.  ?   Heart sounds: Normal heart sounds.  ?Pulmonary:  ?   Effort: Pulmonary effort is normal.  ?   Breath sounds: Normal breath sounds.  ?Abdominal:  ?   Palpations: Abdomen is soft.  ?Musculoskeletal:  ?   Comments: Patient walks with a cane.  ?Skin: ?   General: Skin is warm and dry.  ?Neurological:  ?   General: No focal deficit present.  ?   Mental Status: She is alert and oriented to person, place, and time.  ?Psychiatric:     ?   Mood and Affect: Mood normal.     ?   Behavior: Behavior normal.     ?   Thought Content: Thought content normal.     ?   Judgment: Judgment normal.  ?  ? ? ?  04/10/2021  ? 11:32 AM  ?MMSE - Mini Mental State Exam  ?Orientation to time 5  ?Orientation to Place 5  ?Registration 3  ?Attention/ Calculation 5  ?Recall 3  ?Language- name 2 objects 2  ?Language- repeat 1  ?Language- follow 3 step command 3  ?Language- read & follow direction 1  ?Write a sentence 1  ?Copy design 1  ?Total score 30  ? ? ?Results for orders placed or performed in visit on 04/10/21  ?POCT glycosylated hemoglobin (Hb A1C)  ?Result Value Ref Range  ? Hemoglobin A1C 5.8 (A) 4.0 - 5.6 %  ? Est. average glucose Bld gHb Est-mCnc 120   ? ? Assessment & Plan  ?  ? ?1. Type 2 diabetes mellitus with stage 1 chronic kidney disease, without long-term current use of insulin ([place]) ?A1c remains stable at 5.8.  No changes.  Continue to work on diet and exercise as she is and keep her  weight down ?- POCT glycosylated hemoglobin (Hb A1C) ?- blood glucose meter kit and supplies; Dispense based on patient and insurance preference. Use once daily as directed. (FOR ICD-10 E11.22).  Dispense: 1 each; Refill: 0 ? ?2. Essential (primary) hypertension ?Fair control.  She will bring in her home blood pressure readings on her next visit. ? ?3. MCI (mild cognitive impairment) ?MMSE is 30/30 today. ?One third of the time this problem will get better, one third it will stay the same and one third of the time and will get worse.  Follow clinically with patient ? ?4. Arthritis ?Patient walks with a walker ? ?5. Adult hypothyroidism ?Follow-up TSH ? ?6. Allergic rhinitis, unspecified seasonality, unspecified trigger ? ? ?  7. Pure hypercholesterolemia ?On rosuvastatin 5 mg ? ?8. Class 1 obesity due to excess calories with serious comorbidity and body mass index (BMI) of 31.0 to 31.9 in adult ?BMI of 30. ? ? ? ?Return in about 4 months (around 08/10/2021).  ?   ? ?I, Cynthia Durie, MD, have reviewed all documentation for this visit. The documentation on 04/11/21 for the exam, diagnosis, procedures, and orders are all accurate and complete. ? ? ? ?Jayanth Szczesniak Cranford Mon, MD  ?[facility] ?[phone] (phone) ?[phone] (fax) ? ?[facility] ?

## 2021-04-11 ENCOUNTER — Ambulatory Visit: Payer: Medicare HMO | Admitting: Dermatology

## 2021-04-12 ENCOUNTER — Other Ambulatory Visit: Payer: Self-pay | Admitting: Family Medicine

## 2021-04-12 DIAGNOSIS — I10 Essential (primary) hypertension: Secondary | ICD-10-CM

## 2021-05-01 DIAGNOSIS — E119 Type 2 diabetes mellitus without complications: Secondary | ICD-10-CM | POA: Diagnosis not present

## 2021-05-01 LAB — HM DIABETES EYE EXAM

## 2021-05-15 ENCOUNTER — Telehealth: Payer: Self-pay | Admitting: Family Medicine

## 2021-05-15 NOTE — Telephone Encounter (Signed)
Copied from [place] [id number]. Topic: Medicare AWV ?>> May 15, 2021  1:43 PM Cher Nakai R wrote: ?Reason for CRM:  ?No answer unable to leave a amessage for patient to call back and schedule Medicare Annual Wellness Visit (AWV) in office.  ? ?If unable to come into the office for AWV,  please offer to do virtually or by telephone. ? ?Last AWV: 01/03/2019 ? ?Please schedule at anytime with [facility] Health Advisor. ? ?30 minute appointment for Virtual or phone ?45 minute appointment for in office or Initial virtual/phone ? ?Any questions, please contact me at [phone] ?

## 2021-05-31 ENCOUNTER — Encounter: Payer: Self-pay | Admitting: *Deleted

## 2021-08-12 ENCOUNTER — Encounter: Payer: Medicare HMO | Admitting: Family Medicine

## 2021-08-13 ENCOUNTER — Telehealth: Payer: Self-pay | Admitting: Family Medicine

## 2021-08-13 NOTE — Telephone Encounter (Signed)
Copied from [place] [phone]. Topic: Medicare AWV >> Aug 13, 2021  1:33 PM Jae Dire wrote: Reason for CRM:  No answer unable to leave a message for patient to call back and schedule Medicare Annual Wellness Visit (AWV) in office.   If unable to come into the office for AWV,  please offer to do virtually or by telephone.  Last AWV:  01/03/2019  Please schedule at anytime with [facility] Health Advisor.  30 minute appointment for Virtual or phone 45 minute appointment for in office or Initial virtual/phone  Any questions, please contact me at [phone]

## 2021-09-16 ENCOUNTER — Ambulatory Visit (INDEPENDENT_AMBULATORY_CARE_PROVIDER_SITE_OTHER): Payer: Medicare HMO

## 2021-09-16 VITALS — BP 164/72 | Ht 61.0 in | Wt 162.7 lb

## 2021-09-16 DIAGNOSIS — Z780 Asymptomatic menopausal state: Secondary | ICD-10-CM | POA: Diagnosis not present

## 2021-09-16 DIAGNOSIS — Z0000 Encounter for general adult medical examination without abnormal findings: Secondary | ICD-10-CM | POA: Diagnosis not present

## 2021-09-16 NOTE — Patient Instructions (Signed)
Cynthia Castillo , Thank you for taking time to come for your Medicare Wellness Visit. I appreciate your ongoing commitment to your health goals. Please review the following plan we discussed and let me know if I can assist you in the future.   Screening recommendations/referrals: Colonoscopy: aged out Mammogram: aged out Bone Density: referral sent Recommended yearly ophthalmology/optometry visit for glaucoma screening and checkup Recommended yearly dental visit for hygiene and checkup  Vaccinations: Influenza vaccine: n/d Pneumococcal vaccine: 08/16/13 Tdap vaccine: 10/20/12 Shingles vaccine: Zostavax 10/20/12   Covid-19:had first 2 shots, doesn't remember dates  Advanced directives: no  Conditions/risks identified: none  Next appointment: Follow up in one year for your annual wellness visit 09/18/22 @ 1 pm in person   Preventive Care 80 Years and Older, Female Preventive care refers to lifestyle choices and visits with your health care provider that can promote health and wellness. What does preventive care include? A yearly physical exam. This is also called an annual well check. Dental exams once or twice a year. Routine eye exams. Ask your health care provider how often you should have your eyes checked. Personal lifestyle choices, including: Daily care of your teeth and gums. Regular physical activity. Eating a healthy diet. Avoiding tobacco and drug use. Limiting alcohol use. Practicing safe sex. Taking low-dose aspirin every day. Taking vitamin and mineral supplements as recommended by your health care provider. What happens during an annual well check? The services and screenings done by your health care provider during your annual well check will depend on your age, overall health, lifestyle risk factors, and family history of disease. Counseling  Your health care provider may ask you questions about your: Alcohol use. Tobacco use. Drug use. Emotional well-being. Home  and relationship well-being. Sexual activity. Eating habits. History of falls. Memory and ability to understand (cognition). Work and work Statistician. Reproductive health. Screening  You may have the following tests or measurements: Height, weight, and BMI. Blood pressure. Lipid and cholesterol levels. These may be checked every 5 years, or more frequently if you are over 55 years old. Skin check. Lung cancer screening. You may have this screening every year starting at age 55 if you have a 30-pack-year history of smoking and currently smoke or have quit within the past 15 years. Fecal occult blood test (FOBT) of the stool. You may have this test every year starting at age 80. Flexible sigmoidoscopy or colonoscopy. You may have a sigmoidoscopy every 5 years or a colonoscopy every 10 years starting at age 66. Hepatitis C blood test. Hepatitis B blood test. Sexually transmitted disease (STD) testing. Diabetes screening. This is done by checking your blood sugar (glucose) after you have not eaten for a while (fasting). You may have this done every 1-3 years. Bone density scan. This is done to screen for osteoporosis. You may have this done starting at age 80. Mammogram. This may be done every 1-2 years. Talk to your health care provider about how often you should have regular mammograms. Talk with your health care provider about your test results, treatment options, and if necessary, the need for more tests. Vaccines  Your health care provider may recommend certain vaccines, such as: Influenza vaccine. This is recommended every year. Tetanus, diphtheria, and acellular pertussis (Tdap, Td) vaccine. You may need a Td booster every 10 years. Zoster vaccine. You may need this after age 59. Pneumococcal 13-valent conjugate (PCV13) vaccine. One dose is recommended after age 80. Pneumococcal polysaccharide (PPSV23) vaccine. One dose is recommended  after age 80. Talk to your health care provider  about which screenings and vaccines you need and how often you need them. This information is not intended to replace advice given to you by your health care provider. Make sure you discuss any questions you have with your health care provider. Document Released: 02/02/2015 Document Revised: 09/26/2015 Document Reviewed: 11/07/2014 Elsevier Interactive Patient Education  2017 [place] [employer] Prevention in the Home Falls can cause injuries. They can happen to people of all ages. There are many things you can do to make your home safe and to help prevent falls. What can I do on the outside of my home? Regularly fix the edges of walkways and driveways and fix any cracks. Remove anything that might make you trip as you walk through a door, such as a raised step or threshold. Trim any bushes or trees on the path to your home. Use bright outdoor lighting. Clear any walking paths of anything that might make someone trip, such as rocks or tools. Regularly check to see if handrails are loose or broken. Make sure that both sides of any steps have handrails. Any raised decks and porches should have guardrails on the edges. Have any leaves, snow, or ice cleared regularly. Use sand or salt on walking paths during winter. Clean up any spills in your garage right away. This includes oil or grease spills. What can I do in the bathroom? Use night lights. Install grab bars by the toilet and in the tub and shower. Do not use towel bars as grab bars. Use non-skid mats or decals in the tub or shower. If you need to sit down in the shower, use a plastic, non-slip stool. Keep the floor dry. Clean up any water that spills on the floor as soon as it happens. Remove soap buildup in the tub or shower regularly. Attach bath mats securely with double-sided non-slip rug tape. Do not have throw rugs and other things on the floor that can make you trip. What can I do in the bedroom? Use night lights. Make sure  that you have a light by your bed that is easy to reach. Do not use any sheets or blankets that are too big for your bed. They should not hang down onto the floor. Have a firm chair that has side arms. You can use this for support while you get dressed. Do not have throw rugs and other things on the floor that can make you trip. What can I do in the kitchen? Clean up any spills right away. Avoid walking on wet floors. Keep items that you use a lot in easy-to-reach places. If you need to reach something above you, use a strong step stool that has a grab bar. Keep electrical cords out of the way. Do not use floor polish or wax that makes floors slippery. If you must use wax, use non-skid floor wax. Do not have throw rugs and other things on the floor that can make you trip. What can I do with my stairs? Do not leave any items on the stairs. Make sure that there are handrails on both sides of the stairs and use them. Fix handrails that are broken or loose. Make sure that handrails are as long as the stairways. Check any carpeting to make sure that it is firmly attached to the stairs. Fix any carpet that is loose or worn. Avoid having throw rugs at the top or bottom of the stairs. If you  do have throw rugs, attach them to the floor with carpet tape. Make sure that you have a light switch at the top of the stairs and the bottom of the stairs. If you do not have them, ask someone to add them for you. What else can I do to help prevent falls? Wear shoes that: Do not have high heels. Have rubber bottoms. Are comfortable and fit you well. Are closed at the toe. Do not wear sandals. If you use a stepladder: Make sure that it is fully opened. Do not climb a closed stepladder. Make sure that both sides of the stepladder are locked into place. Ask someone to hold it for you, if possible. Clearly mark and make sure that you can see: Any grab bars or handrails. First and last steps. Where the edge of  each step is. Use tools that help you move around (mobility aids) if they are needed. These include: Canes. Walkers. Scooters. Crutches. Turn on the lights when you go into a dark area. Replace any light bulbs as soon as they burn out. Set up your furniture so you have a clear path. Avoid moving your furniture around. If any of your floors are uneven, fix them. If there are any pets around you, be aware of where they are. Review your medicines with your doctor. Some medicines can make you feel dizzy. This can increase your chance of falling. Ask your doctor what other things that you can do to help prevent falls. This information is not intended to replace advice given to you by your health care provider. Make sure you discuss any questions you have with your health care provider. Document Released: 11/02/2008 Document Revised: 06/14/2015 Document Reviewed: 02/10/2014 Elsevier Interactive Patient Education  2017 [employer].

## 2021-09-16 NOTE — Progress Notes (Signed)
Subjective:   Cynthia Castillo is a 81 y.o. female who presents for Medicare Annual (Subsequent) preventive examination.  Review of Systems     Cardiac Risk Factors include: advanced age (>51mn, >>27women);dyslipidemia;hypertension     Objective:    Today's Vitals   09/16/21 1324  BP: (!) 164/72  Weight: 162 lb 11.2 oz (73.8 kg)  Height: '5\' 1"'  (1.549 m)   Body mass index is 30.74 kg/m.     09/16/2021    1:30 PM 01/03/2019    3:00 PM 12/30/2017    3:09 PM 12/23/2016    1:04 PM 12/05/2015    3:07 PM 06/19/2015   10:35 AM 06/06/2015    8:54 AM  Advanced Directives  Does Patient Have a Medical Advance Directive? No Yes No No Yes Yes Yes  Type of Advance Directive  Living will   Living will Living will Living will  Copy of HSolenin Chart?     No - copy requested  No - copy requested  Would patient like information on creating a medical advance directive? No - Patient declined  No - Patient declined Yes (MAU/Ambulatory/Procedural Areas - Information given)       Current Medications (verified) Outpatient Encounter Medications as of 09/16/2021  Medication Sig   amLODipine (NORVASC) 5 MG tablet Take 1 tablet (5 mg total) by mouth daily.   blood glucose meter kit and supplies Dispense based on patient and insurance preference. Use once daily as directed. (FOR ICD-10 E11.22).   glucose blood (BAYER CONTOUR NEXT TEST) test strip Once daily,fasting E11.40   Lancets MISC 1 each by Does not apply route daily. Dx E11.40   losartan-hydrochlorothiazide (HYZAAR) 100-25 MG tablet Take 1 tablet by mouth once daily   Multiple Vitamins-Minerals (CENTRUM SILVER 50+WOMEN PO) Take by mouth daily.    Omega-3 Fatty Acids (FISH OIL) 1200 MG CAPS Reported on 06/19/2015   rosuvastatin (CRESTOR) 5 MG tablet Take 1 tablet by mouth once daily (Patient not taking: Reported on 09/16/2021)   No facility-administered encounter medications on file as of 09/16/2021.    Allergies  (verified) Latex   History: Past Medical History:  Diagnosis Date   Arthritis    hands   Complication of anesthesia    Diabetes mellitus without complication ([place] [id number]  diet control   Hypertension    Murmur    PONV (postoperative nausea and vomiting)    Past Surgical History:  Procedure Laterality Date   ABDOMINAL HYSTERECTOMY  1972   APPENDECTOMY     BREAST CYST EXCISION Left 1972   COLONOSCOPY  2007   Dr BBary Castilla  COLONOSCOPY WITH PROPOFOL N/A 03/21/2015   Procedure: COLONOSCOPY WITH PROPOFOL;  Surgeon: JRobert Bellow MD;  [facility]  Service: Endoscopy;  Laterality: N/A;   REPLACEMENT TOTAL KNEE BILATERAL     TOTAL KNEE ARTHROPLASTY Right 07/25/2014   Procedure: TOTAL KNEE ARTHROPLASTY;  Surgeon: MHessie Knows MD;  Location: [facility] ORS;  Service: Orthopedics;  Laterality: Right;   TOTAL KNEE ARTHROPLASTY Left 06/19/2015   Procedure: TOTAL KNEE ARTHROPLASTY;  Surgeon: MHessie Knows MD;  Location: [facility] OR[facility];  Service: Orthopedics;  Laterality: Left;   Family History  Problem Relation Age of Onset   Stroke Mother    Heart attack Father    Cancer Sister [phone]      stomach/Shirley   Cancer Sister [phone]      breast/Rosa Graves   Breast cancer Sister    Cancer  Maternal Grandmother        unknown   Social History   Socioeconomic History   Marital status: Widowed    Spouse name: Not on file   Number of children: 1   Years of education: Not on file   Highest education level: Some college, no degree  Occupational History   Occupation: retired  Tobacco Use   Smoking status: Never   Smokeless tobacco: Never  Vaping Use   Vaping Use: Never used  Substance and Sexual Activity   Alcohol use: No    Alcohol/week: 0.0 standard drinks of alcohol   Drug use: No   Sexual activity: Never  Other Topics Concern   Not on file  Social History Narrative   Not on file   Social Determinants of Health   Financial Resource Strain: Low Risk  (09/16/2021)   Overall  Financial Resource Strain (CARDIA)    Difficulty of Paying Living Expenses: Not hard at all  Food Insecurity: No Food Insecurity (09/16/2021)   Hunger Vital Sign    Worried About Running Out of Food in the Last Year: Never true    [place] in the Last Year: Never true  Transportation Needs: No Transportation Needs (09/16/2021)   PRAPARE - Hydrologist (Medical): No    Lack of Transportation (Non-Medical): No  Physical Activity: Insufficiently Active (09/16/2021)   Exercise Vital Sign    Days of Exercise per Week: 7 days    Minutes of Exercise per Session: 20 min  Stress: No Stress Concern Present (09/16/2021)   [place]    Feeling of Stress : Not at all  Social Connections: Socially Isolated (09/16/2021)   Social Connection and Isolation Panel [NHANES]    Frequency of Communication with Friends and Family: Once a week    Frequency of Social Gatherings with Friends and Family: Once a week    Attends Religious Services: More than 4 times per year    Active Member of [employer] or Organizations: No    Attends Archivist Meetings: Never    Marital Status: Widowed    Tobacco Counseling Counseling given: Not Answered   Clinical Intake:  Pre-visit preparation completed: Yes  Pain : No/denies pain     Nutritional Risks: None Diabetes: Yes CBG done?: No Did pt. bring in CBG monitor from home?: No  How often do you need to have someone help you when you read instructions, pamphlets, or other written materials from your doctor or pharmacy?: 1 - Never  Diabetic?yes Nutrition Risk Assessment:  Has the patient had any N/V/D within the last 2 months?  No  Does the patient have any non-healing wounds?  No  Has the patient had any unintentional weight loss or weight gain?  No   Diabetes:  Is the patient diabetic?  Yes  If diabetic, was a CBG obtained today?  No  Did the  patient bring in their glucometer from home?  No  How often do you monitor your CBG's? Once per day.   Financial Strains and Diabetes Management:  Are you having any financial strains with the device, your supplies or your medication? No .  Does the patient want to be seen by Chronic Care Management for management of their diabetes?  No  Would the patient like to be referred to a Nutritionist or for Diabetic Management?  No   Diabetic Exams:  Diabetic Eye Exam: Completed 05/01/21. Marland Kitchen  Pt has been advised about the importance in completing this exam.  Diabetic Foot Exam: Completed 12/02/16. Pt has been advised about the importance in completing this exam.   Interpreter Needed?: No  Information entered by :: Kirke Shaggy, LPN   Activities of Daily Living    09/16/2021    1:31 PM 04/10/2021   11:34 AM  In your present state of health, do you have any difficulty performing the following activities:  Hearing? 1 1  Vision? 1 0  Difficulty concentrating or making decisions? 0 0  Walking or climbing stairs? 1 1  Dressing or bathing? 0 0  Doing errands, shopping? 0 0  Preparing Food and eating ? N   Using the Toilet? N   In the past six months, have you accidently leaked urine? N   Do you have problems with loss of bowel control? N   Managing your Medications? N   Managing your Finances? N   Housekeeping or managing your Housekeeping? N     Patient Care Team: Jerrol Banana., MD as PCP - General (Family Medicine) Bary Castilla, Forest Gleason, MD (General Surgery) Leandrew Koyanagi, MD as Referring Physician (Ophthalmology) Norvel Richards, MD as Referring Physician (Ophthalmology)  Indicate any recent Medical Services you may have received from other than [facility] providers in the past year (date may be approximate).     Assessment:   This is a routine wellness examination for Cynthia Castillo.  Hearing/Vision screen Hearing Screening - Comments:: No aids Vision Screening - Comments::  No glasses-[facility]  Dietary issues and exercise activities discussed: Current Exercise Habits: Home exercise routine, Type of exercise: walking, Time (Minutes): 20, Frequency (Times/Week): 7, Weekly Exercise (Minutes/Week): 140, Intensity: Mild   Goals Addressed             This Visit's Progress    DIET - EAT MORE FRUITS AND VEGETABLES         Depression Screen    09/16/2021    1:28 PM 04/10/2021   11:33 AM 09/13/2020    1:48 PM 01/03/2019    3:01 PM 02/24/2018   10:38 AM 12/30/2017    3:10 PM 01/19/2017   11:16 AM  PHQ 2/9 Scores  PHQ - 2 Score 1 2 0 0 0 0 0  PHQ- 9 Score '2 5 1  2  4    ' Fall Risk    09/16/2021    1:31 PM 04/10/2021   11:33 AM 09/13/2020    1:48 PM 01/03/2019    3:01 PM 02/24/2018   10:38 AM  Fall Risk   Falls in the past year? 0 0 1 0 0  Number falls in past yr: 0 0 1 0 0  Injury with Fall? 0 0 0 0 0  Risk for fall due to : No Fall Risks Impaired balance/gait;Orthopedic patient History of fall(s)    Follow up Falls evaluation completed Falls evaluation completed Falls evaluation completed      [place]:  Any stairs in or around the home? No  If so, are there any without handrails? No  Home free of loose throw rugs in walkways, pet beds, electrical cords, etc? Yes  Adequate lighting in your home to reduce risk of falls? Yes   ASSISTIVE DEVICES UTILIZED TO PREVENT FALLS:  Life alert? No  Use of a cane, walker or w/c? Yes  Grab bars in the bathroom? Yes  Shower chair or bench in shower? No  Elevated toilet seat or  a handicapped toilet? No   TIMED UP AND GO:  Was the test performed? Yes .  Length of time to ambulate 10 feet: 4 sec.   Gait steady and fast with assistive device  Cognitive Function:    04/10/2021   11:32 AM  MMSE - Mini Mental State Exam  Orientation to time 5  Orientation to Place 5  Registration 3  Attention/ Calculation 5  Recall 3  Language- name 2 objects 2  Language- repeat 1   Language- follow 3 step command 3  Language- read & follow direction 1  Write a sentence 1  Copy design 1  Total score 30        12/30/2017    3:14 PM 12/05/2015    3:11 PM  6CIT Screen  What Year? 0 points 0 points  What month? 0 points 0 points  What time? 0 points 0 points  Count back from 20 0 points 0 points  Months in reverse 2 points 2 points  Repeat phrase 0 points 4 points  Total Score 2 points 6 points    Immunizations Immunization History  Administered Date(s) Administered   Fluad Quad(high Dose 65+) 01/03/2019   Influenza, High Dose Seasonal PF 10/23/2014, 09/17/2015, 12/23/2016, 12/30/2017   Pneumococcal Conjugate-13 08/16/2013   Pneumococcal Polysaccharide-23 11/20/2010   Td 08/25/2003   Tdap 10/20/2012   Zoster, Live 10/20/2012    TDAP status: Up to date  Flu Vaccine status: Declined, Education has been provided regarding the importance of this vaccine but patient still declined. Advised may receive this vaccine at local pharmacy or Health Dept. Aware to provide a copy of the vaccination record if obtained from local pharmacy or Health Dept. Verbalized acceptance and understanding.  Pneumococcal vaccine status: Up to date  Covid-19 vaccine status: Completed vaccines- per pt, no dates available  Qualifies for Shingles Vaccine? Yes   Zostavax completed Yes   Shingrix Completed?: No.    Education has been provided regarding the importance of this vaccine. Patient has been advised to call insurance company to determine out of pocket expense if they have not yet received this vaccine. Advised may also receive vaccine at local pharmacy or Health Dept. Verbalized acceptance and understanding.  Screening Tests Health Maintenance  Topic Date Due   COVID-19 Vaccine (1) Never done   Zoster Vaccines- Shingrix (1 of 2) Never done   FOOT EXAM  12/02/2017   Diabetic kidney evaluation - Urine ACR  03/18/2018   Diabetic kidney evaluation - GFR measurement   09/13/2021   INFLUENZA VACCINE  08/20/2021   HEMOGLOBIN A1C  10/11/2021   OPHTHALMOLOGY EXAM  05/02/2022   TETANUS/TDAP  10/21/2022   Pneumonia Vaccine 57+ Years old  Completed   DEXA SCAN  Completed   HPV VACCINES  Aged Out    Health Maintenance  Health Maintenance Due  Topic Date Due   COVID-19 Vaccine (1) Never done   Zoster Vaccines- Shingrix (1 of 2) Never done   FOOT EXAM  12/02/2017   Diabetic kidney evaluation - Urine ACR  03/18/2018   Diabetic kidney evaluation - GFR measurement  09/13/2021   INFLUENZA VACCINE  08/20/2021    Colorectal cancer screening: No longer required.   Mammogram status: No longer required due to age.  Bone Density status: Ordered today. Pt provided with contact info and advised to call to schedule appt.  Lung Cancer Screening: (Low Dose CT Chest recommended if Age 44-80 years, 30 pack-year currently smoking OR have quit w/in 15years.) does  not qualify.     Additional Screening:  Hepatitis C Screening: does qualify; Completed no  Vision Screening: Recommended annual ophthalmology exams for early detection of glaucoma and other disorders of the eye. Is the patient up to date with their annual eye exam?  Yes  Who is the provider or what is the name of the office in which the patient attends annual eye exams? [place] If pt is not established with a provider, would they like to be referred to a provider to establish care? No .   Dental Screening: Recommended annual dental exams for proper oral hygiene  Community Resource Referral / Chronic Care Management: CRR required this visit?  No   CCM required this visit?  No      Plan:     I have personally reviewed and noted the following in the patient's chart:   Medical and social history Use of alcohol, tobacco or illicit drugs  Current medications and supplements including opioid prescriptions. Patient is not currently taking opioid prescriptions. Functional ability and  status Nutritional status Physical activity Advanced directives List of other physicians Hospitalizations, surgeries, and ER visits in previous 12 months Vitals Screenings to include cognitive, depression, and falls Referrals and appointments  In addition, I have reviewed and discussed with patient certain preventive protocols, quality metrics, and best practice recommendations. A written personalized care plan for preventive services as well as general preventive health recommendations were provided to patient.     Dionisio David, LPN   [id number]   Nurse Notes: none

## 2021-09-24 ENCOUNTER — Other Ambulatory Visit: Payer: Self-pay | Admitting: Family Medicine

## 2021-09-24 DIAGNOSIS — I10 Essential (primary) hypertension: Secondary | ICD-10-CM

## 2021-10-17 ENCOUNTER — Encounter: Payer: Medicare HMO | Admitting: Family Medicine

## 2021-10-25 ENCOUNTER — Encounter: Payer: Self-pay | Admitting: Family Medicine

## 2021-10-25 ENCOUNTER — Ambulatory Visit (INDEPENDENT_AMBULATORY_CARE_PROVIDER_SITE_OTHER): Payer: Medicare HMO | Admitting: Family Medicine

## 2021-10-25 VITALS — BP 144/81 | HR 85 | Resp 16 | Ht 61.0 in | Wt 163.0 lb

## 2021-10-25 DIAGNOSIS — I10 Essential (primary) hypertension: Secondary | ICD-10-CM

## 2021-10-25 DIAGNOSIS — E039 Hypothyroidism, unspecified: Secondary | ICD-10-CM | POA: Diagnosis not present

## 2021-10-25 DIAGNOSIS — E1122 Type 2 diabetes mellitus with diabetic chronic kidney disease: Secondary | ICD-10-CM | POA: Diagnosis not present

## 2021-10-25 DIAGNOSIS — N181 Chronic kidney disease, stage 1: Secondary | ICD-10-CM | POA: Diagnosis not present

## 2021-10-25 DIAGNOSIS — Z0000 Encounter for general adult medical examination without abnormal findings: Secondary | ICD-10-CM

## 2021-10-25 DIAGNOSIS — E7800 Pure hypercholesterolemia, unspecified: Secondary | ICD-10-CM

## 2021-10-25 NOTE — Assessment & Plan Note (Signed)
Did not recommend additional screening in 81 year old patient  She is not taking statin therapy at this time

## 2021-10-25 NOTE — Assessment & Plan Note (Signed)
Chronic No current therapy  Will check TSH and free T4 today

## 2021-10-25 NOTE — Progress Notes (Signed)
Complete physical exam   Patient: Cynthia Castillo   DOB: [birth year]   81 y.o. Female  MRN: [id number] Visit Date: 10/25/2021  Today's healthcare provider: [name], MD   Chief Complaint  Patient presents with   Annual Exam   Subjective    Cynthia Castillo is a 81 y.o. female who presents today for a complete physical exam.  She reports consuming a general diet.  Exercise is limited by orthopedic condition(s): knee arthritis bilaterally and shoulder arthritis.  She generally feels fairly well.   She does not have additional problems to discuss today.   HPI  Patient had AWV with NHA on 09/16/2021.  Past Medical History:  Diagnosis Date   Arthritis    hands   Complication of anesthesia    Diabetes mellitus without complication ([place]) 0354   diet control   Hypertension    Murmur    PONV (postoperative nausea and vomiting)    Past Surgical History:  Procedure Laterality Date   ABDOMINAL HYSTERECTOMY  1972   APPENDECTOMY     BREAST CYST EXCISION Left 1972   COLONOSCOPY  2007   Dr Bary Castilla   COLONOSCOPY WITH PROPOFOL N/A 03/21/2015   Procedure: COLONOSCOPY WITH PROPOFOL;  Surgeon: Robert Bellow, MD;  Location: [facility] ENDOSCOPY;  Service: Endoscopy;  Laterality: N/A;   REPLACEMENT TOTAL KNEE BILATERAL     TOTAL KNEE ARTHROPLASTY Right 07/25/2014   Procedure: TOTAL KNEE ARTHROPLASTY;  Surgeon: Hessie Knows, MD;  Location: [facility] ORS;  Service: Orthopedics;  Laterality: Right;   TOTAL KNEE ARTHROPLASTY Left 06/19/2015   Procedure: TOTAL KNEE ARTHROPLASTY;  Surgeon: Hessie Knows, MD;  Location: [facility] ORS;  Service: Orthopedics;  Laterality: Left;   Social History   Socioeconomic History   Marital status: Widowed    Spouse name: Not on file   Number of children: 1   Years of education: Not on file   Highest education level: Some college, no degree  Occupational History   Occupation: retired  Tobacco Use   Smoking status: Never   Smokeless tobacco: Never   Vaping Use   Vaping Use: Never used  Substance and Sexual Activity   Alcohol use: No    Alcohol/week: 0.0 standard drinks of alcohol   Drug use: No   Sexual activity: Never  Other Topics Concern   Not on file  Social History Narrative   Not on file   Social Determinants of Health   Financial Resource Strain: Low Risk  (09/16/2021)   Overall Financial Resource Strain (CARDIA)    Difficulty of Paying Living Expenses: Not hard at all  Food Insecurity: No Food Insecurity (09/16/2021)   Hunger Vital Sign    Worried About Running Out of Food in the Last Year: Never true    [place] in the Last Year: Never true  Transportation Needs: No Transportation Needs (09/16/2021)   PRAPARE - Hydrologist (Medical): No    Lack of Transportation (Non-Medical): No  Physical Activity: Insufficiently Active (09/16/2021)   Exercise Vital Sign    Days of Exercise per Week: 7 days    Minutes of Exercise per Session: 20 min  Stress: No Stress Concern Present (09/16/2021)   [place]    Feeling of Stress : Not at all  Social Connections: Socially Isolated (09/16/2021)   Social Connection and Isolation Panel [NHANES]    Frequency of Communication with Friends and Family: Once  a week    Frequency of Social Gatherings with Friends and Family: Once a week    Attends Religious Services: More than 4 times per year    Active Member of [employer] or Organizations: No    Attends Archivist Meetings: Never    Marital Status: Widowed  Intimate Partner Violence: Not At Risk (09/16/2021)   Humiliation, Afraid, Rape, and Kick questionnaire    Fear of Current or Ex-Partner: No    Emotionally Abused: No    Physically Abused: No    Sexually Abused: No   Family Status  Relation Name Status   Mother  Deceased   Father  Deceased   Sister  Alive   Sister  Alive   MGM  (Not Specified)   Family History   Problem Relation Age of Onset   Stroke Mother    Heart attack Father    Cancer Sister 61       stomach/Shirley   Cancer Sister 52       breast/Rosa Graves   Breast cancer Sister    Cancer Maternal Grandmother        unknown   Allergies  Allergen Reactions   Latex Anaphylaxis    Patient Care Team: [name], MD as PCP - General (Family Medicine) Bary Castilla, Forest Gleason, MD (General Surgery) Leandrew Koyanagi, MD as Referring Physician (Ophthalmology) Norvel Richards, MD as Referring Physician (Ophthalmology)   Medications: Outpatient Medications Prior to Visit  Medication Sig   losartan-hydrochlorothiazide (HYZAAR) 100-25 MG tablet Take 1 tablet by mouth once daily   Multiple Vitamins-Minerals (CENTRUM SILVER 50+WOMEN PO) Take by mouth daily.    Omega-3 Fatty Acids (FISH OIL) 1200 MG CAPS Reported on 06/19/2015   amLODipine (NORVASC) 5 MG tablet Take 1 tablet (5 mg total) by mouth daily. (Patient not taking: Reported on 10/25/2021)   blood glucose meter kit and supplies Dispense based on patient and insurance preference. Use once daily as directed. (FOR ICD-10 E11.22). (Patient not taking: Reported on 10/25/2021)   glucose blood (BAYER CONTOUR NEXT TEST) test strip Once daily,fasting E11.40 (Patient not taking: Reported on 10/25/2021)   Lancets MISC 1 each by Does not apply route daily. Dx E11.40 (Patient not taking: Reported on 10/25/2021)   rosuvastatin (CRESTOR) 5 MG tablet Take 1 tablet by mouth once daily (Patient not taking: Reported on 10/25/2021)   No facility-administered medications prior to visit.    Review of Systems  All other systems reviewed and are negative.     Objective    BP (!) 144/81 (BP Location: Left Arm, Cuff Size: Normal)   Pulse 85   Resp 16   Ht 5' 1" (1.549 m)   Wt 163 lb (73.9 kg)   SpO2 97%   BMI 30.80 kg/m     Physical Exam Vitals reviewed.  Constitutional:      General: She is not in acute distress.     Appearance: Normal appearance. She is not ill-appearing, toxic-appearing or diaphoretic.  Eyes:     Conjunctiva/sclera: Conjunctivae normal.  Cardiovascular:     Rate and Rhythm: Normal rate and regular rhythm.     Pulses:          Dorsalis pedis pulses are 1+ on the right side and 1+ on the left side.       Posterior tibial pulses are 1+ on the right side and 1+ on the left side.     Heart sounds: Normal heart sounds. No murmur heard.    No friction  rub. No gallop.  Pulmonary:     Effort: Pulmonary effort is normal. No respiratory distress.     Breath sounds: Normal breath sounds. No stridor. No wheezing, rhonchi or rales.  Feet:     Right foot:     Protective Sensation: 4 sites tested.  4 sites sensed.     Skin integrity: Callus and dry skin present.     Toenail Condition: Right toenails are abnormally thick and long.     Left foot:     Protective Sensation: 4 sites tested.  4 sites sensed.     Skin integrity: Callus and dry skin present.     Toenail Condition: Left toenails are abnormally thick and long.  Neurological:     Mental Status: She is alert and oriented to person, place, and time.      Last depression screening scores    09/16/2021    1:28 PM 04/10/2021   11:33 AM 09/13/2020    1:48 PM  PHQ 2/9 Scores  PHQ - 2 Score 1 2 0  PHQ- 9 Score _0 Last fall risk screening    09/16/2021    1:31 PM  Fall Risk   Falls in the past year? 0  Number falls in past yr: 0  Injury with Fall? 0  Risk for fall due to : No Fall Risks  Follow up Falls evaluation completed   Last Audit-C alcohol use screening    09/16/2021    1:27 PM  Alcohol Use Disorder Test (AUDIT)  1. How often do you have a drink containing alcohol? 0  2. How many drinks containing alcohol do you have on a typical day when you are drinking? 0  3. How often do you have six or more drinks on one occasion? 0  AUDIT-C Score 0   A score of 3 or more in women, and 4 or more in men indicates increased risk  for alcohol abuse, EXCEPT if all of the points are from question 1   No results found for any visits on 10/25/21.  Assessment & Plan    Routine Health Maintenance and Physical Exam  Exercise Activities and Dietary recommendations  Goals       DIET - EAT MORE FRUITS AND VEGETABLES      DIET - INCREASE WATER INTAKE (pt-stated)      Recommend increasing water intake to 4-6 glasses a day.         Immunization History  Administered Date(s) Administered   Fluad Quad(high Dose 65+) 01/03/2019   Influenza, High Dose Seasonal PF 10/23/2014, 09/17/2015, 12/23/2016, 12/30/2017   Pneumococcal Conjugate-13 08/16/2013   Pneumococcal Polysaccharide-23 11/20/2010   Td 08/25/2003   Tdap 10/20/2012   Zoster, Live 10/20/2012    Health Maintenance  Topic Date Due   COVID-19 Vaccine (1) Never done   Zoster Vaccines- Shingrix (1 of 2) Never done   Diabetic kidney evaluation - Urine ACR  03/18/2018   INFLUENZA VACCINE  08/20/2021   Diabetic kidney evaluation - GFR measurement  09/13/2021   HEMOGLOBIN A1C  10/11/2021   OPHTHALMOLOGY EXAM  05/02/2022   TETANUS/TDAP  10/21/2022   FOOT EXAM  10/26/2022   Pneumonia Vaccine 106+ Years old  Completed   DEXA SCAN  Completed   HPV VACCINES  Aged Out    Discussed health benefits of physical activity, and encouraged her to engage in regular exercise appropriate for her age and condition.  Problem List Items Addressed This Visit  Cardiovascular and Mediastinum   Essential (primary) hypertension - Primary    Chronic BP is elevated and improved on repeat  She will continue losartan-hctz 100-32m  We will check CMP today  F/u BP in three months unless abnormal labs       Relevant Orders   Comprehensive metabolic panel     Endocrine   Diabetes mellitus, type 2 (HCC)    Chronic, stable  Will check A1c  She has not been able to test blood glucose due to loosing her testing glucometer and supplies       Relevant Orders   Ambulatory  referral to Podiatry   Urine Microalbumin w/creat. ratio   Hemoglobin A1c   Adult hypothyroidism    Chronic No current therapy  Will check TSH and free T4 today        Relevant Orders   TSH + free T4     Other   HLD (hyperlipidemia)    Did not recommend additional screening in 81year old patient  She is not taking statin therapy at this time        Healthcare maintenance    Patient recommended for influenza vaccine, however, reports that if she has this and blood work in same day it will make her feel ill  Will recommend she schedule for influenza vaccine clinic at later date  Foot exam completed today  Recommended COVID vaccine and Shingles vaccine         Return in about 3 months (around 01/25/2022) for DM,HTN.     I, MEulis Foster MD, have reviewed all documentation for this visit. The documentation on 10/25/21 for the exam, diagnosis, procedures, and orders are all accurate and complete.    MEulis Foster MD  [phone](phone) [phone](fax)  [place]

## 2021-10-25 NOTE — Assessment & Plan Note (Addendum)
Chronic BP is elevated and improved on repeat  She will continue losartan-hctz 100-'25mg'$   We will check CMP today  F/u BP in three months unless abnormal labs

## 2021-10-25 NOTE — Assessment & Plan Note (Addendum)
Chronic, stable  Will check A1c  She has not been able to test blood glucose due to loosing her testing glucometer and supplies

## 2021-10-25 NOTE — Assessment & Plan Note (Signed)
Patient recommended for influenza vaccine, however, reports that if she has this and blood work in same day it will make her feel ill  Will recommend she schedule for influenza vaccine clinic at later date  Foot exam completed today  Recommended COVID vaccine and Shingles vaccine

## 2021-10-30 ENCOUNTER — Telehealth: Payer: Self-pay | Admitting: Family Medicine

## 2021-10-30 ENCOUNTER — Telehealth: Payer: Self-pay

## 2021-10-30 DIAGNOSIS — E119 Type 2 diabetes mellitus without complications: Secondary | ICD-10-CM | POA: Diagnosis not present

## 2021-10-30 NOTE — Telephone Encounter (Signed)
Na/mailbox id full. Call to see if patient had her labs done? If not need to come to have them done. Labs placed. Patient also need a follow up appointment schedule around 01/25/2022 for DM and HTN. Ok for [facility] to advise and schedule appt.

## 2021-10-30 NOTE — Telephone Encounter (Signed)
Will contact patient to schedule follow up labs   Cynthia Foster, MD  [facility]  [phone]

## 2021-10-31 NOTE — Telephone Encounter (Signed)
Try calling patient again. No answer.

## 2021-11-05 NOTE — Telephone Encounter (Signed)
Called patient again no answer, mailbox is full.

## 2022-02-03 ENCOUNTER — Telehealth: Payer: Self-pay | Admitting: *Deleted

## 2022-02-03 NOTE — Patient Outreach (Signed)
  Care Coordination   02/03/2022 Name: Cynthia Castillo MRN: [id number] DOB: Sep 13, 1940   Care Coordination Outreach Attempts:  An unsuccessful telephone outreach was attempted today to offer the patient information about available care coordination services as a benefit of their health plan.   Follow Up Plan:  Additional outreach attempts will be made to offer the patient care coordination information and services.   Encounter Outcome:  No Answer   Care Coordination Interventions:  No, not indicated    Valente David, RN MSN, [facility] Care Management Coordinator [phone]

## 2022-02-06 ENCOUNTER — Telehealth: Payer: Self-pay | Admitting: *Deleted

## 2022-02-06 NOTE — Patient Outreach (Signed)
  Care Coordination   02/06/2022 Name: Cynthia Castillo MRN: [id number] DOB: [birth year]   Care Coordination Outreach Attempts:  A second unsuccessful outreach was attempted today to offer the patient with information about available care coordination services as a benefit of their health plan.     Follow Up Plan:  Additional outreach attempts will be made to offer the patient care coordination information and services.   Encounter Outcome:  No Answer   Care Coordination Interventions:  No, not indicated    Valente David, RN, MSN, [facility] Care Management Care Management Coordinator [phone]

## 2022-02-11 ENCOUNTER — Telehealth: Payer: Self-pay | Admitting: *Deleted

## 2022-02-11 DIAGNOSIS — I10 Essential (primary) hypertension: Secondary | ICD-10-CM | POA: Diagnosis not present

## 2022-02-11 DIAGNOSIS — E039 Hypothyroidism, unspecified: Secondary | ICD-10-CM | POA: Diagnosis not present

## 2022-02-11 DIAGNOSIS — N181 Chronic kidney disease, stage 1: Secondary | ICD-10-CM | POA: Diagnosis not present

## 2022-02-11 DIAGNOSIS — E1122 Type 2 diabetes mellitus with diabetic chronic kidney disease: Secondary | ICD-10-CM | POA: Diagnosis not present

## 2022-02-11 NOTE — Patient Outreach (Signed)
  Care Coordination   02/11/2022 Name: KYEISHA JANOWICZ MRN: [id number] DOB: Sep 20, 1940   Care Coordination Outreach Attempts:  A third unsuccessful outreach was attempted today to offer the patient with information about available care coordination services as a benefit of their health plan.   Follow Up Plan:  No further outreach attempts will be made at this time. We have been unable to contact the patient to offer or enroll patient in care coordination services  Encounter Outcome:  No Answer   Care Coordination Interventions:  No, not indicated    Valente David, RN, MSN, [facility] Care Management Care Management Coordinator [phone]

## 2022-02-12 LAB — COMPREHENSIVE METABOLIC PANEL WITH GFR
ALT: 9 IU/L (ref 0–32)
AST: 20 IU/L (ref 0–40)
Albumin/Globulin Ratio: 1.3 (ref 1.2–2.2)
Albumin: 4.3 g/dL (ref 3.8–4.8)
Alkaline Phosphatase: 84 IU/L (ref 44–121)
BUN/Creatinine Ratio: 14 (ref 12–28)
BUN: 18 mg/dL (ref 8–27)
Bilirubin Total: 0.4 mg/dL (ref 0.0–1.2)
CO2: 21 mmol/L (ref 20–29)
Calcium: 9.8 mg/dL (ref 8.7–10.3)
Chloride: 103 mmol/L (ref 96–106)
Creatinine, Ser: 1.25 mg/dL — ABNORMAL HIGH (ref 0.57–1.00)
Globulin, Total: 3.3 g/dL (ref 1.5–4.5)
Glucose: 126 mg/dL — ABNORMAL HIGH (ref 70–99)
Potassium: 4 mmol/L (ref 3.5–5.2)
Sodium: 141 mmol/L (ref 134–144)
Total Protein: 7.6 g/dL (ref 6.0–8.5)
eGFR: 44 mL/min/1.73 — ABNORMAL LOW

## 2022-02-12 LAB — MICROALBUMIN / CREATININE URINE RATIO
Creatinine, Urine: 158.4 mg/dL
Microalb/Creat Ratio: 5 mg/g{creat} (ref 0–29)
Microalbumin, Urine: 8.4 ug/mL

## 2022-02-12 LAB — TSH+FREE T4
Free T4: 1.54 ng/dL (ref 0.82–1.77)
TSH: 2.58 u[IU]/mL (ref 0.450–4.500)

## 2022-02-12 LAB — HEMOGLOBIN A1C
Est. average glucose Bld gHb Est-mCnc: 134 mg/dL
Hgb A1c MFr Bld: 6.3 % — ABNORMAL HIGH (ref 4.8–5.6)

## 2022-02-13 ENCOUNTER — Other Ambulatory Visit: Payer: Self-pay | Admitting: Family Medicine

## 2022-02-13 DIAGNOSIS — R7989 Other specified abnormal findings of blood chemistry: Secondary | ICD-10-CM

## 2022-02-14 NOTE — Progress Notes (Unsigned)
I,Cynthia Castillo,acting as a scribe for [employer], MD.,have documented all relevant documentation on the behalf of Cynthia Foster, MD,as directed by  Cynthia Foster, MD while in the presence of Cynthia Foster, MD.   Established patient visit   Patient: Cynthia Castillo   DOB: August 02, 1940   82 y.o. Female  MRN: [id number] Visit Date: 02/17/2022  Today's healthcare provider: [name], MD   Chief Complaint  Patient presents with   Follow-up HTN DM   Subjective    HPI  Diabetes Mellitus Type II, follow-up  Lab Results  Component Value Date   HGBA1C 6.3 (H) 02/11/2022   HGBA1C 5.8 (A) 04/10/2021   HGBA1C 5.8 (H) 09/13/2020   Last seen for diabetes 3 months ago.  Management since then includes none. Not on medication. Patient had abnormal foot exam at last visit, states she has not heard from podiatry regarding referral, she would like to still be evaluated for DM foot exam   Current insulin regiment: none  --------------------------------------------------------------------------------------------------- Hypertension, follow-up  BP Readings from Last 3 Encounters:  02/17/22 (!) 91/53  10/25/21 (!) 144/81  09/16/21 (!) 164/72   Wt Readings from Last 3 Encounters:  02/17/22 166 lb 6.4 oz (75.5 kg)  10/25/21 163 lb (73.9 kg)  09/16/21 162 lb 11.2 oz (73.8 kg)     She was last seen for hypertension 3 months ago.  BP at that visit was 144/81. Management since that visit includes  continue losartan-hctz 100-25 mg.. She reports excellent compliance with treatment.  Outside blood pressures are not being checked.  She does not smoke.  Hand Pain Patient reports having intermittent burning pains in both hands  She denies weakness  She has noticed improvement of the hyperpigmented skin on her hands but states she continues to have dark pigmentation on the PIP joints of both hands    Healthcare  Maintenance Declined Covid vaccine Flu vaccine:agreeable to receive today  Medications: Outpatient Medications Prior to Visit  Medication Sig   losartan-hydrochlorothiazide (HYZAAR) 100-25 MG tablet Take 1 tablet by mouth once daily   Multiple Vitamins-Minerals (CENTRUM SILVER 50+WOMEN PO) Take by mouth daily.    Omega-3 Fatty Acids (FISH OIL) 1200 MG CAPS Reported on 06/19/2015   [DISCONTINUED] amLODipine (NORVASC) 5 MG tablet Take 1 tablet (5 mg total) by mouth daily. (Patient not taking: Reported on 10/25/2021)   [DISCONTINUED] blood glucose meter kit and supplies Dispense based on patient and insurance preference. Use once daily as directed. (FOR ICD-10 E11.22). (Patient not taking: Reported on 10/25/2021)   [DISCONTINUED] glucose blood (BAYER CONTOUR NEXT TEST) test strip Once daily,fasting E11.40 (Patient not taking: Reported on 10/25/2021)   [DISCONTINUED] Lancets MISC 1 each by Does not apply route daily. Dx E11.40 (Patient not taking: Reported on 10/25/2021)   [DISCONTINUED] rosuvastatin (CRESTOR) 5 MG tablet Take 1 tablet by mouth once daily (Patient not taking: Reported on 10/25/2021)   No facility-administered medications prior to visit.    Review of Systems     Objective    BP (!) 91/53 (BP Location: Left Arm, Patient Position: Sitting, Cuff Size: Normal)   Pulse 96   Temp 98.2 F (36.8 C) (Oral)   Resp 16   Ht '5\' 1"'$  (1.549 m)   Wt 166 lb 6.4 oz (75.5 kg)   BMI 31.44 kg/m    Physical Exam Vitals reviewed.  Constitutional:      General: She is not in acute distress.    Appearance: Normal appearance. She is not  ill-appearing, toxic-appearing or diaphoretic.  HENT:     Right Ear: No tenderness. No middle ear effusion. There is no impacted cerumen.     Left Ear: Decreased hearing noted. No tenderness.  No middle ear effusion. There is impacted cerumen.  Eyes:     Conjunctiva/sclera: Conjunctivae normal.  Cardiovascular:     Rate and Rhythm: Normal rate and regular  rhythm.     Pulses: Normal pulses.     Heart sounds: Normal heart sounds. No murmur heard.    No friction rub. No gallop.  Pulmonary:     Effort: Pulmonary effort is normal. No respiratory distress.     Breath sounds: Normal breath sounds. No stridor. No wheezing, rhonchi or rales.  Abdominal:     General: Bowel sounds are normal. There is no distension.     Palpations: Abdomen is soft.     Tenderness: There is no abdominal tenderness.  Musculoskeletal:     Right lower leg: No edema.     Left lower leg: No edema.  Skin:    Findings: No erythema or rash.  Neurological:     Mental Status: She is alert and oriented to person, place, and time.       No results found for any visits on 02/17/22.  Assessment & Plan     Problem List Items Addressed This Visit       Cardiovascular and Mediastinum   Essential (primary) hypertension    BP is hypotensive today, patient asymptomatic  Discontinued amlodipine  She will continue losartan-hctz combination med  Recommend regular checks at home and notify office if persistently low measurements as she will likely need decreased dose of losartan-hctz pill          Endocrine   Diabetes mellitus, type 2 ([place]) - Primary    Well controlled last A1c was less than 7 at 6.3, has been at goal for several recent checks  Recommended that she no longer check blood glucoses at home  She will continue with diet management       Relevant Orders   Ambulatory referral to Podiatry     Nervous and Auditory   Impacted cerumen of left ear     Other   HLD (hyperlipidemia)    Informed patient that she can d/c atorvastatin        Need for influenza vaccination    Influenza vaccine was administered today  Patient tolerated well        Relevant Orders   Flu Vaccine QUAD High Dose(Fluad) (Completed)   Bilateral hand pain    Bilateral hand pain  Noted for hx of arthritis  Prescribed gabapentin '100mg'$  at bedtime          Return in about 6  months (around 08/18/2022) for blood pressure .       The entirety of the information documented in the History of Present Illness, Review of Systems and Physical Exam were personally obtained by me. Portions of this information were initially documented by Beverlee Nims, CMA and reviewed by me for thoroughness and accuracy.Cynthia Foster, MD   Cynthia Foster, MD  [facility] [phone] (phone) [phone] (fax)  [place]

## 2022-02-17 ENCOUNTER — Ambulatory Visit (INDEPENDENT_AMBULATORY_CARE_PROVIDER_SITE_OTHER): Payer: Medicare HMO | Admitting: Family Medicine

## 2022-02-17 ENCOUNTER — Encounter: Payer: Self-pay | Admitting: Family Medicine

## 2022-02-17 VITALS — BP 91/53 | HR 96 | Temp 98.2°F | Resp 16 | Ht 61.0 in | Wt 166.4 lb

## 2022-02-17 DIAGNOSIS — E7800 Pure hypercholesterolemia, unspecified: Secondary | ICD-10-CM | POA: Diagnosis not present

## 2022-02-17 DIAGNOSIS — H6122 Impacted cerumen, left ear: Secondary | ICD-10-CM | POA: Diagnosis not present

## 2022-02-17 DIAGNOSIS — I10 Essential (primary) hypertension: Secondary | ICD-10-CM | POA: Diagnosis not present

## 2022-02-17 DIAGNOSIS — E1122 Type 2 diabetes mellitus with diabetic chronic kidney disease: Secondary | ICD-10-CM | POA: Diagnosis not present

## 2022-02-17 DIAGNOSIS — M79641 Pain in right hand: Secondary | ICD-10-CM | POA: Diagnosis not present

## 2022-02-17 DIAGNOSIS — M79642 Pain in left hand: Secondary | ICD-10-CM | POA: Diagnosis not present

## 2022-02-17 DIAGNOSIS — N181 Chronic kidney disease, stage 1: Secondary | ICD-10-CM

## 2022-02-17 DIAGNOSIS — Z23 Encounter for immunization: Secondary | ICD-10-CM | POA: Diagnosis not present

## 2022-02-17 MED ORDER — GABAPENTIN 100 MG PO CAPS: 100.0000 mg | ORAL_CAPSULE | Freq: Every day | ORAL | 0 refills | Status: DC

## 2022-02-17 NOTE — Assessment & Plan Note (Signed)
Bilateral hand pain  Noted for hx of arthritis  Prescribed gabapentin '100mg'$  at bedtime

## 2022-02-17 NOTE — Patient Instructions (Addendum)
Please stop taking the amlodipine for your blood pressure.  Please check your blood pressure daily and notify our office if your measurements are consistently low less than 90/60.    I will re-submit the referral for the podiatrist to check your feet. They were unable to reach you via phone last time so please be on the lookout for a call to schedule this appointment.    Your blood sugar has been under very good control so you will no longer need to check your blood glucose at home. Please continue to manage your blood sugar with diet.   We will check your blood work again at your follow up visit in 6 months   I have prescribed Gabapentin for your pain in your hands. Please take '100mg'$  at bedtime as this medication can make you sleepy.   The drops for your ear are called Debrox drops

## 2022-02-17 NOTE — Assessment & Plan Note (Signed)
Influenza vaccine was administered today  Patient tolerated well   

## 2022-02-17 NOTE — Assessment & Plan Note (Signed)
Informed patient that she can d/c atorvastatin

## 2022-02-17 NOTE — Assessment & Plan Note (Signed)
Well controlled last A1c was less than 7 at 6.3, has been at goal for several recent checks  Recommended that she no longer check blood glucoses at home  She will continue with diet management

## 2022-02-17 NOTE — Assessment & Plan Note (Signed)
BP is hypotensive today, patient asymptomatic  Discontinued amlodipine  She will continue losartan-hctz combination med  Recommend regular checks at home and notify office if persistently low measurements as she will likely need decreased dose of losartan-hctz pill

## 2022-04-14 ENCOUNTER — Telehealth: Payer: Self-pay | Admitting: Family Medicine

## 2022-04-14 NOTE — Telephone Encounter (Signed)
Please send letter to patient to schedule appointment if unable to reach her via telephone for appointment to follow up on medication management and labs within the next 2 months   Patient has had foot exam that was abnormal and she was referred to podiatry for further evaluation and help with toe nail maintenance   [name], MD  [facility]

## 2022-04-14 NOTE — Telephone Encounter (Signed)
Patients daughter came in and wants to make you aware of some concerns that she has about her mothers health.  She states that her mother's hands have black spots on them and she tells her daughter that the doctor does not know what they are.  The daughter is concerned that it is because of diabetes.  She is also concerned that her mother is not taking her medications correctly.  She states that her current medication bottles are from 04/13/2021.  She would like her feet looked at at her next ov.  She does not want her mother to know that she has even been here because she will be furious and take her off of everything to be able to find out information.  She would like to know if she could talk with you about her mother. I advised her that the pt was supposed to have lab work done and was supposed to schedule an appointment in July and didn't.  The daughter says that she will not be able to mention that to her because then she will know that she has been inquiring about her.  She states that her mother does not answer her phone when people call her so we will not likely get the pt if we try to call her to set up an appointment and remind her to have repeat labs done.

## 2022-04-14 NOTE — Telephone Encounter (Signed)
Letter send to patient.

## 2022-05-06 DIAGNOSIS — R7989 Other specified abnormal findings of blood chemistry: Secondary | ICD-10-CM | POA: Diagnosis not present

## 2022-05-07 LAB — BASIC METABOLIC PANEL
BUN/Creatinine Ratio: 10 — ABNORMAL LOW (ref 12–28)
BUN: 14 mg/dL (ref 8–27)
CO2: 18 mmol/L — ABNORMAL LOW (ref 20–29)
Calcium: 9.4 mg/dL (ref 8.7–10.3)
Chloride: 106 mmol/L (ref 96–106)
Creatinine, Ser: 1.41 mg/dL — ABNORMAL HIGH (ref 0.57–1.00)
Glucose: 104 mg/dL — ABNORMAL HIGH (ref 70–99)
Potassium: 4.3 mmol/L (ref 3.5–5.2)
Sodium: 142 mmol/L (ref 134–144)
eGFR: 38 mL/min/{1.73_m2} — ABNORMAL LOW (ref >59)

## 2022-05-08 NOTE — Progress Notes (Unsigned)
Patient was no-show for appointment scheduled for 3:40 PM on 05/09/2022

## 2022-05-09 ENCOUNTER — Encounter: Payer: Self-pay | Admitting: Family Medicine

## 2022-05-09 ENCOUNTER — Ambulatory Visit (INDEPENDENT_AMBULATORY_CARE_PROVIDER_SITE_OTHER): Payer: Medicare HMO | Admitting: Family Medicine

## 2022-05-09 DIAGNOSIS — Z91199 Patient's noncompliance with other medical treatment and regimen due to unspecified reason: Secondary | ICD-10-CM

## 2022-05-22 NOTE — Progress Notes (Unsigned)
Established patient visit   Patient: Cynthia Castillo   DOB: August 19, 1940   82 y.o. Female  MRN: [id number] Visit Date: 05/23/2022  Today's healthcare provider: Ronnald Ramp, MD   No chief complaint on file.  Subjective    HPI  Hypertension, follow-up  BP Readings from Last 3 Encounters:  05/23/22 (!) 186/84  02/17/22 (!) 91/53  10/25/21 (!) 144/81   Wt Readings from Last 3 Encounters:  05/23/22 173 lb (78.5 kg)  02/17/22 166 lb 6.4 oz (75.5 kg)  10/25/21 163 lb (73.9 kg)     She was last seen for hypertension 4 months ago.  BP at that visit was 91/53. Management since that visit includes continuing hyzaar 100-25mg  once daily. She states she has not had her medication today  and has not been checking her BP   She reports fair compliance with treatment. Reports that she may forget the medication 2 days in a week  She is having side effects. Dizziness, lightheadedness sometimes  She is following a Regular diet. She states she has introduced pork back into her diet at breakfast  She is not exercising. She does not smoke.   Outside blood pressures are not being checked  Her last blood work showed elevated creatinine of 1.41 on 05/06/22  She states she does not remember having trouble with kidney function in the past  She states she is not drinking as much water as she should, states she used to drink 5-6 containers per day  She states she gets at least 1-2 containers per day of water now  She mostly drinks sweet tea and sometimes will have a soda   Symptoms: No chest pain No chest pressure  No palpitations No syncope  No dyspnea Yes orthopnea  No paroxysmal nocturnal dyspnea No lower extremity edema   Pertinent labs Lab Results  Component Value Date   CHOL 198 09/13/2020   HDL 60 09/13/2020   LDLCALC 120 (H) 09/13/2020   TRIG 103 09/13/2020   CHOLHDL 3.3 09/13/2020   Lab Results  Component Value Date   NA 142 05/06/2022   K 4.3 05/06/2022    CREATININE 1.41 (H) 05/06/2022   EGFR 38 (L) 05/06/2022   GLUCOSE 104 (H) 05/06/2022   TSH 2.580 02/11/2022     The ASCVD Risk score (Arnett DK, et al., 2019) failed to calculate for the following reasons:   The 2019 ASCVD risk score is only valid for ages 25 to 63  ---------------------------------------------------------------------------------------------------  Diabetes Mellitus Type II, Follow-up  Lab Results  Component Value Date   HGBA1C 6.3 (H) 02/11/2022   HGBA1C 5.8 (A) 04/10/2021   HGBA1C 5.8 (H) 09/13/2020   Wt Readings from Last 3 Encounters:  05/23/22 173 lb (78.5 kg)  02/17/22 166 lb 6.4 oz (75.5 kg)  10/25/21 163 lb (73.9 kg)   Last seen for diabetes 3 months ago.  Management since then includes continue diet management.  Symptoms: {Yes/No:20286} fatigue No foot ulcerations  {Yes/No:20286} appetite changes {Yes/No:20286} nausea  {Yes/No:20286} paresthesia of the feet  {Yes/No:20286} polydipsia  {Yes/No:20286} polyuria {Yes/No:20286} visual disturbances   {Yes/No:20286} vomiting     Home blood sugar records:  does not check    Current insulin regiment: none   Most Recent Eye Exam: reports she has an appt scheduled in June, usually goes to [facility] center   Current exercise: none Current diet habits: in general, an "unhealthy" diet  Pertinent Labs: Lab Results  Component Value Date  CHOL 198 09/13/2020   HDL 60 09/13/2020   LDLCALC 120 (H) 09/13/2020   TRIG 103 09/13/2020   CHOLHDL 3.3 09/13/2020   Lab Results  Component Value Date   NA 142 05/06/2022   K 4.3 05/06/2022   CREATININE 1.41 (H) 05/06/2022   EGFR 38 (L) 05/06/2022   LABMICR 8.4 02/11/2022   MICRALBCREAT 5 02/11/2022     ---------------------------------------------------------------------------------------------------   Medications: Outpatient Medications Prior to Visit  Medication Sig   gabapentin (NEURONTIN) 100 MG capsule Take 1 capsule (100 mg total) by mouth at  bedtime.   losartan-hydrochlorothiazide (HYZAAR) 100-25 MG tablet Take 1 tablet by mouth once daily   Multiple Vitamins-Minerals (CENTRUM SILVER 50+WOMEN PO) Take by mouth daily.    Omega-3 Fatty Acids (FISH OIL) 1200 MG CAPS Reported on 06/19/2015   No facility-administered medications prior to visit.    Review of Systems  {Labs  Heme  Chem  Endocrine  Serology  Results Review (optional):23779}   Objective    There were no vitals taken for this visit. {Show previous vital signs (optional):23777}  Physical Exam  ***  No results found for any visits on 05/23/22.  Assessment & Plan     Problem List Items Addressed This Visit       Cardiovascular and Mediastinum   Essential (primary) hypertension - Primary     No follow-ups on file.        Ronnald Ramp, MD  [facility] [phone] (phone) [phone] (fax)  [facility]

## 2022-05-23 ENCOUNTER — Ambulatory Visit (INDEPENDENT_AMBULATORY_CARE_PROVIDER_SITE_OTHER): Payer: Medicare HMO | Admitting: Family Medicine

## 2022-05-23 ENCOUNTER — Encounter: Payer: Self-pay | Admitting: Family Medicine

## 2022-05-23 VITALS — BP 186/84 | HR 88 | Ht 61.0 in | Wt 173.0 lb

## 2022-05-23 DIAGNOSIS — N181 Chronic kidney disease, stage 1: Secondary | ICD-10-CM

## 2022-05-23 DIAGNOSIS — E1122 Type 2 diabetes mellitus with diabetic chronic kidney disease: Secondary | ICD-10-CM | POA: Diagnosis not present

## 2022-05-23 DIAGNOSIS — I10 Essential (primary) hypertension: Secondary | ICD-10-CM

## 2022-05-23 DIAGNOSIS — L309 Dermatitis, unspecified: Secondary | ICD-10-CM | POA: Diagnosis not present

## 2022-05-23 MED ORDER — TRIAMCINOLONE ACETONIDE 0.1 % EX OINT: 1.0000 | TOPICAL_OINTMENT | Freq: Two times a day (BID) | CUTANEOUS | 0 refills | Status: DC

## 2022-05-23 MED ORDER — LOSARTAN POTASSIUM-HCTZ 100-25 MG PO TABS: 1.0000 | ORAL_TABLET | Freq: Every day | ORAL | 1 refills | Status: DC

## 2022-05-23 NOTE — Patient Instructions (Addendum)
Please continue to take your blood pressure medication Hyzaar daily and monitor your blood pressure with goal of pressure being less than 140/90.   I have sent a cream for you to apply to your hands twice daily until your visit next month  I have sent updated prescriptions for your medication   The podiatrist that we have asked to call you is called [facility] their number is below  Address: [address], [place], [place] [address] Hours:  Closed ? Opens 8?AM Mon Phone: [phone] DATE Systolic  Diastolic   EXAMPLE: 05/23/22 186 84

## 2022-05-24 DIAGNOSIS — L309 Dermatitis, unspecified: Secondary | ICD-10-CM | POA: Insufficient documentation

## 2022-05-24 LAB — BASIC METABOLIC PANEL
BUN/Creatinine Ratio: 12 (ref 12–28)
BUN: 13 mg/dL (ref 8–27)
CO2: 19 mmol/L — ABNORMAL LOW (ref 20–29)
Calcium: 9.1 mg/dL (ref 8.7–10.3)
Chloride: 108 mmol/L — ABNORMAL HIGH (ref 96–106)
Creatinine, Ser: 1.1 mg/dL — ABNORMAL HIGH (ref 0.57–1.00)
Glucose: 99 mg/dL (ref 70–99)
Potassium: 4.2 mmol/L (ref 3.5–5.2)
Sodium: 141 mmol/L (ref 134–144)
eGFR: 51 mL/min/{1.73_m2} — ABNORMAL LOW (ref >59)

## 2022-05-24 LAB — HEMOGLOBIN A1C
Est. average glucose Bld gHb Est-mCnc: 126 mg/dL
Hgb A1c MFr Bld: 6 % — ABNORMAL HIGH (ref 4.8–5.6)

## 2022-05-24 NOTE — Assessment & Plan Note (Signed)
Hyperpigmentation on bilateral knuckles  Recommended applying triamcinolone for two weeks

## 2022-05-24 NOTE — Assessment & Plan Note (Signed)
Chronic  Last A1c within goal range  Lab Results  Component Value Date   HGBA1C 6.0 (H) 05/23/2022  A1c repeated today  She was unable to schedule last podiatry referral, will submit new referral today  Patient was given contact information for podiatrist so that she will be familiar with the phone number when they contact her to schedule  Counseled patient on the importance of scheduling and attending this appt  Recommended patient schedule DM eye exam

## 2022-05-24 NOTE — Assessment & Plan Note (Signed)
Chronic  Uncontrolled, not at goal today  Advised patient to use chart to record blood pressure  Prescription written for hyzaar 100-25mg  daily  Will check BMP today

## 2022-05-28 ENCOUNTER — Telehealth: Payer: Self-pay

## 2022-05-28 NOTE — Telephone Encounter (Signed)
VM full, called to discuss lab results.

## 2022-06-03 ENCOUNTER — Encounter: Payer: Self-pay | Admitting: Family Medicine

## 2022-06-24 NOTE — Progress Notes (Unsigned)
Cynthia Castillo,Cynthia Castillo,acting as a Neurosurgeon for [employer], Cynthia Castillo.,have documented all relevant documentation on the behalf of Cynthia Ramp, Cynthia Castillo,as directed by  Cynthia Ramp, Cynthia Castillo while in the presence of Cynthia Ramp, Cynthia Castillo.   Established patient visit   Patient: Cynthia Castillo   DOB: February 14, 1940   82 y.o. Female  MRN: [id number] Visit Date: 06/25/2022  Today's healthcare provider: Ronnald Ramp, Cynthia Castillo   No chief complaint on file.  Subjective    HPI  Hypertension, follow-up  BP Readings from Last 3 Encounters:  06/25/22 (!) 160/80  05/23/22 (!) 186/84  02/17/22 (!) 91/53   Wt Readings from Last 3 Encounters:  06/25/22 168 lb 8 oz (76.4 kg)  05/23/22 173 lb (78.5 kg)  02/17/22 166 lb 6.4 oz (75.5 kg)     She was last seen for hypertension 1 months ago.  BP at that visit was 186/84. Management since that visit includes advised patient to use chart to record blood pressure  Prescription written for hyzaar 100-25mg  daily.  She reports excellent compliance with treatment. She is not having side effects. She is following a Regular, no salt unless she has take out   diet. She is exercising. She does not smoke. They report occasional feelings of weakness and faintness, but deny any falls or significant episodes. They have been compliant with their current medication, losartan hydrochlorothiazide 125 mg daily, and deny any issues with missing doses. The patient also reports occasional headaches, which they believe are related to shoulder pain and their sleeping position. They deny any significant pain, but note that the headaches tend to occur when their shoulders hurt.  Outside blood pressures are has not checked in few weeks due to machine issues. Symptoms: No chest pain No chest pressure  No palpitations No syncope  No dyspnea No orthopnea  No paroxysmal nocturnal dyspnea No lower extremity edema   Pertinent labs Lab Results   Component Value Date   CHOL 198 09/13/2020   HDL 60 09/13/2020   LDLCALC 120 (H) 09/13/2020   TRIG 103 09/13/2020   CHOLHDL 3.3 09/13/2020   Lab Results  Component Value Date   NA 141 05/23/2022   K 4.2 05/23/2022   CREATININE 1.10 (H) 05/23/2022   EGFR 51 (L) 05/23/2022   GLUCOSE 99 05/23/2022   TSH 2.580 02/11/2022     The ASCVD Risk score (Arnett DK, et al., 2019) failed to calculate for the following reasons:   The 2019 ASCVD risk score is only valid for ages 92 to 89 ---------------------------------------------------------------------------------------------------   Discussed the use of AI scribe software for clinical note transcription with the patient, who gave verbal consent to proceed.   In addition, the patient has noticed some changes in their vision, specifically seeing blue where there should not be any. They have not had an eye exam recently and have misplaced their prescription for glasses. She is seen at [facility] eye center and plans to make a follow up appt for new glasses and DM eye exam.   The patient also experiences indigestion, which they describe as a combination of heartburn and difficulty swallowing. They manage this by drinking water and have not taken any medication for it. The water helps her to pass food and she denies frequent symptoms. She is not interested in additional medication at this time.   Skin hyperpigmentation  Lastly, the patient has noticed dark areas on their hands. They have not applied any treatment and continue with their normal hygiene practices.  Medications: Outpatient Medications Prior to Visit  Medication Sig   losartan-hydrochlorothiazide (HYZAAR) 100-25 MG tablet Take 1 tablet by mouth daily.   Multiple Vitamins-Minerals (CENTRUM SILVER 50+WOMEN PO) Take by mouth daily.    triamcinolone ointment (KENALOG) 0.1 % Apply 1 Application topically 2 (two) times daily.   No facility-administered medications prior to visit.     Review of Systems     Objective    BP (!) 160/80   Pulse 73   Ht 5\' 1"  (1.549 m)   Wt 168 lb 8 oz (76.4 kg)   BMI 31.84 kg/m    Physical Exam HENT:     Mouth/Throat:     Dentition: Abnormal dentition. Dental caries present. No dental tenderness.     Comments: Poor dentition     Physical Exam   VITALS: BP- 160/80 CHEST: No crackles, no wheezing, air movement adequate bilaterally. CARDIOVASCULAR: Heartbeat regular, rhythm regular, rate normal, normal S1 and S2. ABDOMEN: Abdomen soft, no masses palpated. EXTREMITIES: No edema in legs. SKIN: Dark areas on hands.         No results found for any visits on 06/25/22.  Assessment & Plan    Assessment and Plan    Hypertension  Inconsistent home blood pressure readings with some low readings and symptoms of hypotension, but elevated readings in office (178/87 and 160/80). Currently on Losartan/Hydrochlorothiazide 125mg  daily. -Add Amlodipine 2.5mg  daily to current regimen. -Follow-up appointment in 2 weeks to reassess blood pressure control.  Dyspepsia  Reports of indigestion, possibly related to eating habits. No alarming symptoms such as difficulty swallowing or significant weight loss. -Advise to drink water for symptom relief. -Monitor symptoms and if they become more frequent, patient to reach out for follow up appt   Oral Health  Reports of oral health issues, no pain reported. -Referral to Dentistry for evaluation and treatment.  DM  Delay in attending podiatry appointment due to prioritizing oral health issues. -Encourage patient to schedule podiatry appointment   Skin Health: Dark areas on hands, no treatment applied. -Advise to continue normal hygiene and moisturize hands regularly. -Document skin changes with a photograph for future reference.       Return in about 2 weeks (around 07/09/2022) for HTN.       The entirety of the information documented in the History of Present Illness, Review of  Systems and Physical Exam were personally obtained by me,  Cynthia Ramp, Cynthia Castillo . Portions of this information were initially documented by Cynthia Castillo.  Cynthia Castillo, Cynthia Castillo,  have reviewed the above documentation for thoroughness and accuracy.        Cynthia Ramp, Cynthia Castillo  [facility] [phone] (phone) [phone] (fax)  [facility]

## 2022-06-25 ENCOUNTER — Telehealth: Payer: Self-pay | Admitting: Family Medicine

## 2022-06-25 ENCOUNTER — Encounter: Payer: Self-pay | Admitting: Family Medicine

## 2022-06-25 ENCOUNTER — Ambulatory Visit (INDEPENDENT_AMBULATORY_CARE_PROVIDER_SITE_OTHER): Payer: Medicare HMO | Admitting: Family Medicine

## 2022-06-25 VITALS — BP 160/80 | HR 73 | Ht 61.0 in | Wt 168.5 lb

## 2022-06-25 DIAGNOSIS — K089 Disorder of teeth and supporting structures, unspecified: Secondary | ICD-10-CM | POA: Diagnosis not present

## 2022-06-25 DIAGNOSIS — R1013 Epigastric pain: Secondary | ICD-10-CM

## 2022-06-25 DIAGNOSIS — N181 Chronic kidney disease, stage 1: Secondary | ICD-10-CM

## 2022-06-25 DIAGNOSIS — L819 Disorder of pigmentation, unspecified: Secondary | ICD-10-CM

## 2022-06-25 DIAGNOSIS — I10 Essential (primary) hypertension: Secondary | ICD-10-CM

## 2022-06-25 DIAGNOSIS — E1122 Type 2 diabetes mellitus with diabetic chronic kidney disease: Secondary | ICD-10-CM

## 2022-06-25 MED ORDER — AMLODIPINE BESYLATE 2.5 MG PO TABS: 2.5000 mg | ORAL_TABLET | Freq: Every day | ORAL | 0 refills | Status: DC

## 2022-06-25 NOTE — Telephone Encounter (Signed)
Mailbox full unable to leave voicemail. CRM created. Ok for [facility] to advise if patient returns call

## 2022-06-25 NOTE — Assessment & Plan Note (Signed)
Inconsistent home blood pressure readings with some low readings and symptoms of hypotension, but elevated readings in office (178/87 and 160/80). Currently on Losartan/Hydrochlorothiazide 125mg  daily. -Add Amlodipine 2.5mg  daily to current regimen. -Follow-up appointment in 2 weeks to reassess blood pressure control.

## 2022-06-25 NOTE — Assessment & Plan Note (Signed)
Reports of oral health issues, no pain reported. -Referral to Dentistry for evaluation and treatment.

## 2022-06-25 NOTE — Patient Instructions (Signed)
Your blood pressure was elevated today with the highest systolic of 170s and in the 160s on repeat.  My goal is for your blood pressure to be less than 140/90.  I have prescribed a new medication amlodipine 2.5 mg daily for you to take along with your losartan-hydrochlorothiazide.   Please measure your blood pressure 1 hour after taking your blood pressure medications for your blood pressure measurements for [place] to review at your next visit in 2-3 weeks  Please notify our office if you have any lightheadedness, dizziness or feel like he may faint while taking a blood pressure medication.  If this does occur or if you have blood pressures that are less than 100/60, please stop taking the amlodipine and notify our office  Please make sure to return the call for podiatry to schedule your appointment for your feet.  I have also submitted referral to dentistry.  Please be on look out for a call within the next 2 weeks to schedule an appointment.

## 2022-06-25 NOTE — Assessment & Plan Note (Signed)
-  Advise to continue normal hygiene and moisturize hands regularly. -Document skin changes with a photograph for future reference, img in exam section

## 2022-06-25 NOTE — Assessment & Plan Note (Addendum)
Delay in attending podiatry appointment due to prioritizing oral health issues. -Encourage patient to schedule podiatry appointment -Encourage patient to schedule an eye exam at [facility].

## 2022-06-25 NOTE — Telephone Encounter (Signed)
Please advise patient that the dental referral will not be scheduled for her as these benefits are managed differently. Please instruct patient to schedule with dentist of her choice for evaluation.   Ronnald Ramp, MD  [facility]

## 2022-07-17 NOTE — Progress Notes (Signed)
I,Vanessa  Vital,acting as a Neurosurgeon for [employer], MD.,have documented all relevant documentation on the behalf of Ronnald Ramp, MD,as directed by  Ronnald Ramp, MD while in the presence of Ronnald Ramp, MD.   Established patient visit   Patient: Cynthia Castillo   DOB: [birth year]   82 y.o. Female  MRN: [id number] Visit Date: 07/18/2022  Today's healthcare provider: Ronnald Ramp, MD   Chief Complaint  Patient presents with   Hypertension   Subjective     HPI   Patient to have not gotten amlodipine, there was no prescription at the pharmacy for it. Last edited by Lubertha Basque, CMA on 07/18/2022  2:28 PM.      Hypertension, follow-up  BP Readings from Last 3 Encounters:  07/18/22 (!) 171/80  06/25/22 (!) 160/80  05/23/22 (!) 186/84   Wt Readings from Last 3 Encounters:  07/18/22 169 lb 3.2 oz (76.7 kg)  06/25/22 168 lb 8 oz (76.4 kg)  05/23/22 173 lb (78.5 kg)     She was last seen for hypertension 3 weeks ago.  BP at that visit was 160/80. Management since that visit includes Losartan-Hydrochlorothiazide 100-25mg  daily & added Amlodipine 2.5mg  daily  Patient reports that she was unable to pick up the prescription for the amlodipine 2.5mg  daily She reports that she sometimes has headaches on the right side of her head that she thinks can be related to her pillow She reports that she has to strain sometimes to push herself up from the supine position, HA occur 1-2 times per week   She reports excellent compliance with treatment. She is not having side effects.  She is following a Regular diet. She is exercising. She does not smoke.    Outside blood pressures are hasn't checked in over a week Symptoms: No chest pain No chest pressure  No palpitations No syncope  No dyspnea No orthopnea  No paroxysmal nocturnal dyspnea No lower extremity edema   Pertinent labs Lab Results  Component Value Date    CHOL 198 09/13/2020   HDL 60 09/13/2020   LDLCALC 120 (H) 09/13/2020   TRIG 103 09/13/2020   CHOLHDL 3.3 09/13/2020   Lab Results  Component Value Date   NA 141 05/23/2022   K 4.2 05/23/2022   CREATININE 1.10 (H) 05/23/2022   EGFR 51 (L) 05/23/2022   GLUCOSE 99 05/23/2022   TSH 2.580 02/11/2022     The ASCVD Risk score (Arnett DK, et al., 2019) failed to calculate for the following reasons:   The 2019 ASCVD risk score is only valid for ages 31 to 80  --------------------------------------------------------------------------------------------------- HM State that she has not been to either the dentist or podiatry due to having death in her family  Reports that the funeral is scheduled for tomorrow and then she will be able to follow up on the dentist and podiatrist appointments   Medications: Outpatient Medications Prior to Visit  Medication Sig   losartan-hydrochlorothiazide (HYZAAR) 100-25 MG tablet Take 1 tablet by mouth daily.   Multiple Vitamins-Minerals (CENTRUM SILVER 50+WOMEN PO) Take by mouth daily.    triamcinolone ointment (KENALOG) 0.1 % Apply 1 Application topically 2 (two) times daily. (Patient not taking: Reported on 07/18/2022)   [DISCONTINUED] amLODipine (NORVASC) 2.5 MG tablet Take 1 tablet (2.5 mg total) by mouth daily. (Patient not taking: Reported on 07/18/2022)   No facility-administered medications prior to visit.    Review of Systems     Objective    BP (!) 171/80 (BP  Location: Right Arm, Patient Position: Sitting, Cuff Size: Normal)   Pulse 79   Resp 14   Ht 5\' 1"  (1.549 m)   Wt 169 lb 3.2 oz (76.7 kg)   SpO2 97%   BMI 31.97 kg/m    Physical Exam Vitals reviewed.  Constitutional:      General: She is not in acute distress.    Appearance: Normal appearance. She is not ill-appearing, toxic-appearing or diaphoretic.  Eyes:     Conjunctiva/sclera: Conjunctivae normal.  Cardiovascular:     Rate and Rhythm: Normal rate and regular rhythm.      Pulses: Normal pulses.     Heart sounds: Normal heart sounds. No murmur heard.    No friction rub. No gallop.  Pulmonary:     Effort: Pulmonary effort is normal. No respiratory distress.     Breath sounds: Normal breath sounds. No stridor. No wheezing, rhonchi or rales.  Abdominal:     General: Bowel sounds are normal. There is no distension.     Palpations: Abdomen is soft.     Tenderness: There is no abdominal tenderness.  Musculoskeletal:     Right lower leg: No edema.     Left lower leg: No edema.  Skin:    Findings: No erythema or rash.  Neurological:     Mental Status: She is alert and oriented to person, place, and time.       No results found for any visits on 07/18/22.  Assessment & Plan     Problem List Items Addressed This Visit     Essential (primary) hypertension - Primary    Chronic Not at goal Blood pressure elevated on multiple measurements in clinic Recommended that she continue losartan-hydrochlorothiazide 100-25 mg daily Resubmitted prescription for amlodipine 2.5 mg Counseled patient on importance of obtaining blood pressure cuff to monitor blood pressure at home and bring record of blood pressures to review at her next appointment Follow-up in 2 weeks for blood pressure Provided patient with a prescription for blood pressure cuff      Relevant Medications   amLODipine (NORVASC) 2.5 MG tablet     Return in about 2 weeks (around 08/01/2022) for HTN.         The entirety of the information documented in the History of Present Illness, Review of Systems and Physical Exam were personally obtained by me. Portions of this information were initially documented by Lubertha Basque, CMA. I, Ronnald Ramp, MD have reviewed the documentation above for thoroughness and accuracy.     Ronnald Ramp, MD  [facility] [phone] (phone) [phone] (fax)  [facility]

## 2022-07-18 ENCOUNTER — Ambulatory Visit (INDEPENDENT_AMBULATORY_CARE_PROVIDER_SITE_OTHER): Payer: Medicare HMO | Admitting: Family Medicine

## 2022-07-18 ENCOUNTER — Encounter: Payer: Self-pay | Admitting: Family Medicine

## 2022-07-18 VITALS — BP 171/80 | HR 79 | Resp 14 | Ht 61.0 in | Wt 169.2 lb

## 2022-07-18 DIAGNOSIS — I10 Essential (primary) hypertension: Secondary | ICD-10-CM | POA: Diagnosis not present

## 2022-07-18 MED ORDER — AMLODIPINE BESYLATE 2.5 MG PO TABS
2.5000 mg | ORAL_TABLET | Freq: Every day | ORAL | 0 refills | Status: DC
Start: 2022-07-18 — End: 2022-08-01

## 2022-07-18 NOTE — Patient Instructions (Addendum)
It was a pleasure seeing you today.  If blood pressure remains elevated above goal of less than 140/90.  It will be important to start the amlodipine 2.5 mg once daily along with the losartan-hydrochlorothiazide 100-25 mg daily  Based on our conversation from your recent visit, want to make sure that your blood pressure is not too low as well as not to high.   Blood pressure that would be concerning for being too low would be less than 100/60  Please use the provided prescription for blood pressure cuff at a local medical supply store as it will be important for you to be able to monitor your blood pressure at home over the next few weeks  Please follow-up with me in 2 weeks for blood pressure recheck  Please make sure to follow-up with the podiatrist for your foot exam as this is important in the management of your diabetes. [facility]   [address] [place],  [place]  [address]  [phone]   Please also make sure to make your dentist appointment when you are available

## 2022-07-18 NOTE — Assessment & Plan Note (Signed)
Chronic Not at goal Blood pressure elevated on multiple measurements in clinic Recommended that she continue losartan-hydrochlorothiazide 100-25 mg daily Resubmitted prescription for amlodipine 2.5 mg Counseled patient on importance of obtaining blood pressure cuff to monitor blood pressure at home and bring record of blood pressures to review at her next appointment Follow-up in 2 weeks for blood pressure Provided patient with a prescription for blood pressure cuff

## 2022-08-01 ENCOUNTER — Telehealth: Payer: Self-pay | Admitting: Family Medicine

## 2022-08-01 ENCOUNTER — Encounter: Payer: Self-pay | Admitting: Family Medicine

## 2022-08-01 ENCOUNTER — Ambulatory Visit (INDEPENDENT_AMBULATORY_CARE_PROVIDER_SITE_OTHER): Payer: Medicare HMO | Admitting: Family Medicine

## 2022-08-01 VITALS — BP 146/81 | HR 83 | Temp 97.6°F | Resp 16 | Ht 61.0 in | Wt 167.0 lb

## 2022-08-01 DIAGNOSIS — M79641 Pain in right hand: Secondary | ICD-10-CM | POA: Diagnosis not present

## 2022-08-01 DIAGNOSIS — M79642 Pain in left hand: Secondary | ICD-10-CM | POA: Diagnosis not present

## 2022-08-01 DIAGNOSIS — N181 Chronic kidney disease, stage 1: Secondary | ICD-10-CM | POA: Diagnosis not present

## 2022-08-01 DIAGNOSIS — I10 Essential (primary) hypertension: Secondary | ICD-10-CM

## 2022-08-01 DIAGNOSIS — E1122 Type 2 diabetes mellitus with diabetic chronic kidney disease: Secondary | ICD-10-CM

## 2022-08-01 DIAGNOSIS — M170 Bilateral primary osteoarthritis of knee: Secondary | ICD-10-CM

## 2022-08-01 MED ORDER — AMLODIPINE BESYLATE 2.5 MG PO TABS
2.5000 mg | ORAL_TABLET | Freq: Every day | ORAL | 0 refills | Status: DC
Start: 2022-08-01 — End: 2022-09-01

## 2022-08-01 MED ORDER — LOSARTAN POTASSIUM-HCTZ 100-25 MG PO TABS: 1.0000 | ORAL_TABLET | Freq: Every day | ORAL | 1 refills | Status: DC

## 2022-08-01 NOTE — Patient Instructions (Signed)
[facility]  [address], [place], [place] [address] Hours:  Closed ? Opens 8?AM Mon Phone: [phone]   Please continue your amlodipine 2.5mg  and losartan-hydrochlorothiazide daily, goal is for your blood pressure to be less than 140/90   Please follow up with me in 1 month for your hands and blood pressure   I have referred you for home health. Please be on the lookout for an appointment to schedule visits to help with your hands

## 2022-08-01 NOTE — Progress Notes (Signed)
Established patient visit   Patient: Cynthia Castillo   DOB: 1940-04-11   82 y.o. Female  MRN: [id number] Visit Date: 08/01/2022  Today's healthcare provider: Ronnald Ramp, MD   Chief Complaint  Patient presents with   Medical Management of Chronic Issues    Patient reports good compliance with medications. She denies any chest pain, shortness of breath or swelling.    Subjective     HPI     Medical Management of Chronic Issues    Additional comments: Patient reports good compliance with medications. She denies any chest pain, shortness of breath or swelling.       Last edited by Myles Lipps, CMA on 08/01/2022  2:40 PM.      Discussed the use of AI scribe software for clinical note transcription with the patient, who gave verbal consent to proceed.  History of Present Illness   The patient, Cynthia Castillo, presents for a follow-up consultation primarily concerning elevated blood pressure. The patient reports that her blood pressure was initially high but has since decreased, although not to the desired goal. She has been unable to measure her blood pressure at home due to difficulties with the blood pressure monitor. The patient started taking amlodipine 2.5 mg the previous night, in addition to her existing medication, Loci. She reports no symptoms of lightheadedness or dizziness since starting the new medication.  The patient also expresses concerns about her hands, specifically a lack of grip strength and occasional numbness in her little finger. She reports no pain when attempting to open jars or bottles, but struggles due to a lack of grip. The patient also mentions a recent visit from a healthcare professional who examined her feet. The patient acknowledges that she has problems with her feet, but has not yet been able to see a podiatrist.  The patient's daughter, Cynthia Castillo, expresses concerns about the patient's ability to manage her medication and her  overall ability to live independently, particularly due to the patient's hand mobility issues. Cynthia Castillo also expresses concerns about the patient's driving ability. The patient acknowledges these concerns but insists that she is managing fine in her home environment.       Medications: Outpatient Medications Prior to Visit  Medication Sig   Multiple Vitamins-Minerals (CENTRUM SILVER 50+WOMEN PO) Take by mouth daily.    [DISCONTINUED] amLODipine (NORVASC) 2.5 MG tablet Take 1 tablet (2.5 mg total) by mouth daily.   [DISCONTINUED] losartan-hydrochlorothiazide (HYZAAR) 100-25 MG tablet Take 1 tablet by mouth daily.   [DISCONTINUED] triamcinolone ointment (KENALOG) 0.1 % Apply 1 Application topically 2 (two) times daily. (Patient not taking: Reported on 07/18/2022)   No facility-administered medications prior to visit.    Review of Systems  Last metabolic panel Lab Results  Component Value Date   GLUCOSE 99 05/23/2022   NA 141 05/23/2022   K 4.2 05/23/2022   CL 108 (H) 05/23/2022   CO2 19 (L) 05/23/2022   BUN 13 05/23/2022   CREATININE 1.10 (H) 05/23/2022   EGFR 51 (L) 05/23/2022   CALCIUM 9.1 05/23/2022   PROT 7.6 02/11/2022   ALBUMIN 4.3 02/11/2022   LABGLOB 3.3 02/11/2022   AGRATIO 1.3 02/11/2022   BILITOT 0.4 02/11/2022   ALKPHOS 84 02/11/2022   AST 20 02/11/2022   ALT 9 02/11/2022   ANIONGAP 6 06/20/2015         Objective    BP (!) 146/81 (BP Location: Left Arm, Patient Position: Sitting, Cuff Size: Large)   Pulse  83   Temp 97.6 F (36.4 C) (Temporal)   Resp 16   Ht 5\' 1"  (1.549 m)   Wt 167 lb (75.8 kg)   SpO2 99%   BMI 31.55 kg/m    BP Readings from Last 3 Encounters:  08/01/22 (!) 146/81  07/18/22 (!) 171/80  06/25/22 (!) 160/80       Physical Exam  Physical Exam   CHEST: Lungs clear to auscultation, no crackles, no wheezing. MUSCULOSKELETAL: Grip strength weak. Upper extremity strength normal with flexion and extension of elbows.       No  results found for any visits on 08/01/22.   Assessment & Plan     Problem List Items Addressed This Visit     Bilateral hand pain   Relevant Orders   Ambulatory referral to Home Health   Diabetes mellitus, type 2 (HCC)   Relevant Medications   losartan-hydrochlorothiazide (HYZAAR) 100-25 MG tablet   Other Relevant Orders   Ambulatory referral to Home Health   Essential (primary) hypertension - Primary   Relevant Medications   amLODipine (NORVASC) 2.5 MG tablet   losartan-hydrochlorothiazide (HYZAAR) 100-25 MG tablet   Primary osteoarthritis of knee   Relevant Orders   Ambulatory referral to Home Health       Hypertension: Blood pressure improved but not at goal. Recently started Amlodipine 2.5mg  in addition to existing Losartan-Hydrochlorothiazide 100-25mg  daily. No symptoms of hypotension reported. -Continue current regimen. -Check blood pressure at home and monitor for symptoms of hypotension.  Decreased Hand Strength: Difficulty with grip strength and opening bottles. No pain reported. Possible ulnar nerve compression. -Refer for home health physical therapy and occupational therapy to improve hand strength and functionality.  Foot Care: Concerns about foot health. No recent podiatry visit. -Recommend patient contact [facility] for an appointment.  General Health Maintenance: -Continue monitoring blood glucose levels. Current A1C at goal (6.0). -Consider safety of patient living alone and driving. Discuss findings with home health team.        Return in about 1 month (around 09/01/2022) for HTN, hand pain, feet.       Ronnald Ramp, MD  [facility] [phone] (phone) [phone] (fax)  [facility]

## 2022-08-05 ENCOUNTER — Telehealth: Payer: Self-pay | Admitting: Family Medicine

## 2022-08-05 NOTE — Telephone Encounter (Signed)
Trey Paula with [facility] called to say they were postponing pt's care by one day per pats request.

## 2022-08-05 NOTE — Telephone Encounter (Signed)
 Reviewed

## 2022-08-06 DIAGNOSIS — R32 Unspecified urinary incontinence: Secondary | ICD-10-CM | POA: Diagnosis not present

## 2022-08-06 DIAGNOSIS — E119 Type 2 diabetes mellitus without complications: Secondary | ICD-10-CM | POA: Diagnosis not present

## 2022-08-06 DIAGNOSIS — M170 Bilateral primary osteoarthritis of knee: Secondary | ICD-10-CM | POA: Diagnosis not present

## 2022-08-06 DIAGNOSIS — I10 Essential (primary) hypertension: Secondary | ICD-10-CM | POA: Diagnosis not present

## 2022-08-08 DIAGNOSIS — I10 Essential (primary) hypertension: Secondary | ICD-10-CM | POA: Diagnosis not present

## 2022-08-08 DIAGNOSIS — R32 Unspecified urinary incontinence: Secondary | ICD-10-CM | POA: Diagnosis not present

## 2022-08-08 DIAGNOSIS — M170 Bilateral primary osteoarthritis of knee: Secondary | ICD-10-CM | POA: Diagnosis not present

## 2022-08-08 DIAGNOSIS — E119 Type 2 diabetes mellitus without complications: Secondary | ICD-10-CM | POA: Diagnosis not present

## 2022-08-12 DIAGNOSIS — M170 Bilateral primary osteoarthritis of knee: Secondary | ICD-10-CM | POA: Diagnosis not present

## 2022-08-12 DIAGNOSIS — R32 Unspecified urinary incontinence: Secondary | ICD-10-CM | POA: Diagnosis not present

## 2022-08-12 DIAGNOSIS — E119 Type 2 diabetes mellitus without complications: Secondary | ICD-10-CM | POA: Diagnosis not present

## 2022-08-12 DIAGNOSIS — I10 Essential (primary) hypertension: Secondary | ICD-10-CM | POA: Diagnosis not present

## 2022-08-18 DIAGNOSIS — R32 Unspecified urinary incontinence: Secondary | ICD-10-CM

## 2022-08-18 DIAGNOSIS — E119 Type 2 diabetes mellitus without complications: Secondary | ICD-10-CM

## 2022-08-18 DIAGNOSIS — M170 Bilateral primary osteoarthritis of knee: Secondary | ICD-10-CM

## 2022-08-18 DIAGNOSIS — I10 Essential (primary) hypertension: Secondary | ICD-10-CM

## 2022-08-19 DIAGNOSIS — R32 Unspecified urinary incontinence: Secondary | ICD-10-CM | POA: Diagnosis not present

## 2022-08-19 DIAGNOSIS — I10 Essential (primary) hypertension: Secondary | ICD-10-CM | POA: Diagnosis not present

## 2022-08-19 DIAGNOSIS — E119 Type 2 diabetes mellitus without complications: Secondary | ICD-10-CM | POA: Diagnosis not present

## 2022-08-19 DIAGNOSIS — M170 Bilateral primary osteoarthritis of knee: Secondary | ICD-10-CM | POA: Diagnosis not present

## 2022-08-26 DIAGNOSIS — R32 Unspecified urinary incontinence: Secondary | ICD-10-CM | POA: Diagnosis not present

## 2022-08-26 DIAGNOSIS — E119 Type 2 diabetes mellitus without complications: Secondary | ICD-10-CM | POA: Diagnosis not present

## 2022-08-26 DIAGNOSIS — M170 Bilateral primary osteoarthritis of knee: Secondary | ICD-10-CM | POA: Diagnosis not present

## 2022-08-26 DIAGNOSIS — I10 Essential (primary) hypertension: Secondary | ICD-10-CM | POA: Diagnosis not present

## 2022-09-01 ENCOUNTER — Ambulatory Visit: Payer: Medicare HMO | Admitting: Family Medicine

## 2022-09-01 ENCOUNTER — Encounter: Payer: Self-pay | Admitting: Family Medicine

## 2022-09-01 VITALS — BP 142/60 | HR 97

## 2022-09-01 DIAGNOSIS — I10 Essential (primary) hypertension: Secondary | ICD-10-CM | POA: Diagnosis not present

## 2022-09-01 DIAGNOSIS — N181 Chronic kidney disease, stage 1: Secondary | ICD-10-CM | POA: Diagnosis not present

## 2022-09-01 DIAGNOSIS — E1122 Type 2 diabetes mellitus with diabetic chronic kidney disease: Secondary | ICD-10-CM

## 2022-09-01 MED ORDER — AMLODIPINE BESYLATE 5 MG PO TABS
5.0000 mg | ORAL_TABLET | Freq: Every day | ORAL | 2 refills | Status: DC
Start: 2022-09-01 — End: 2023-02-17

## 2022-09-01 NOTE — Assessment & Plan Note (Signed)
Delay in attending foot exam due to communication issues. No current foot complaints reported. -Encouraged patient to schedule foot exam as soon as possible. -Plan to review foot exam at next visit.

## 2022-09-01 NOTE — Progress Notes (Signed)
Established patient visit   Patient: Cynthia Castillo   DOB: [birth year]   82 y.o. Female  MRN: [id number] Visit Date: 09/01/2022  Today's healthcare provider: Ronnald Ramp, MD   Chief Complaint  Patient presents with   Medical Management of Chronic Issues    Follow up on HTN, No concerns today    Subjective     HPI     Medical Management of Chronic Issues    Additional comments: Follow up on HTN, No concerns today       Last edited by Rolly Salter, CMA on 09/01/2022  2:06 PM.       Discussed the use of AI scribe software for clinical note transcription with the patient, who gave verbal consent to proceed.  History of Present Illness   Cynthia Castillo, a patient with hypertension and diabetes, presents for a follow-up visit primarily concerning her blood pressure management. She reports no recent changes in her health status. Her current antihypertensive regimen includes Amlodipine 2.5mg  and a combination of Losartan 100mg  and Hydrochlorothiazide 25mg . Despite this, her blood pressure reading during the visit was elevated at 147/63 mmHg, which she attributes to nervousness about the appointment. She has not been able to monitor her blood pressure at home due to difficulties setting up her blood pressure machine.  In addition to her hypertension, Cynthia Castillo has been experiencing issues with her feet, which she has not yet addressed despite a referral to a podiatrist. She reports difficulty fitting into shoes comfortably, which was particularly noticeable when she had to buy shoes for a recent family event. She has not noticed any other changes in her health, including no new symptoms of dizziness, lightheadedness, or headaches. She does report some headaches, which she believes are related to her sleeping position.  Cynthia Castillo is committed to improving her health, expressing a desire to lower her blood pressure and to take better care of her feet. She plans to  schedule an appointment with the podiatrist and to get assistance with setting up her home blood pressure monitor.   reports [facility] for [facility] exam        Podiatry referral closed due to inability to schedule and not answering calls   Medications: Outpatient Medications Prior to Visit  Medication Sig   losartan-hydrochlorothiazide (HYZAAR) 100-25 MG tablet Take 1 tablet by mouth daily.   Multiple Vitamins-Minerals (CENTRUM SILVER 50+WOMEN PO) Take by mouth daily.    [DISCONTINUED] amLODipine (NORVASC) 2.5 MG tablet Take 1 tablet (2.5 mg total) by mouth daily.   No facility-administered medications prior to visit.    Review of Systems      Objective    BP (!) 142/60 (BP Location: Left Arm, Patient Position: Sitting, Cuff Size: Normal)   Pulse 97   SpO2 99%     Physical Exam Vitals reviewed.  Constitutional:      General: She is not in acute distress.    Appearance: Normal appearance. She is not ill-appearing, toxic-appearing or diaphoretic.  Eyes:     Conjunctiva/sclera: Conjunctivae normal.  Cardiovascular:     Rate and Rhythm: Normal rate and regular rhythm.     Pulses: Normal pulses.     Heart sounds: Normal heart sounds. No murmur heard.    No friction rub. No gallop.  Pulmonary:     Effort: Pulmonary effort is normal. No respiratory distress.     Breath sounds: Normal breath sounds. No stridor. No wheezing, rhonchi or rales.  Abdominal:     General: Bowel sounds are normal. There is no distension.     Palpations: Abdomen is soft.     Tenderness: There is no abdominal tenderness.  Musculoskeletal:     Right lower leg: No edema.     Left lower leg: No edema.  Skin:    Findings: No erythema or rash.  Neurological:     Mental Status: She is alert and oriented to person, place, and time.       No results found for any visits on 09/01/22.  Assessment & Plan     Problem List Items Addressed This Visit     Diabetes mellitus, type 2 (HCC) -  Primary    Delay in attending foot exam due to communication issues. No current foot complaints reported. -Encouraged patient to schedule foot exam as soon as possible. -Plan to review foot exam at next visit.      Essential (primary) hypertension    Blood pressure slightly elevated at 147/63 and 142/60. Currently on Amlodipine 2.5mg  and Losartan/Hydrochlorothiazide 100/25mg . No symptoms of dizziness or lightheadedness reported. -Increase Amlodipine to 5mg  daily (use up 2.5mg  tablets by taking two daily, then switch to 5mg  tablets). -Continue Losartan/Hydrochlorothiazide 100/25mg  daily. -Check blood pressure at home once machine is set up.      Relevant Medications   amLODipine (NORVASC) 5 MG tablet       Headaches Mild headaches reported, possibly related to sleeping position. Improvement noted with change in sleeping position. -Continue current management and monitor.    Return in about 2 weeks (around 09/15/2022) for HTN.        Ronnald Ramp, MD  [facility] [phone] (phone) [phone] (fax)  [facility]

## 2022-09-01 NOTE — Patient Instructions (Signed)
VISIT SUMMARY:  Dear Cynthia Castillo, thank you for coming in for your follow-up visit. We discussed your high blood pressure, foot discomfort, and occasional headaches. Your blood pressure was slightly elevated, and we made some adjustments to your medication. We also discussed the importance of scheduling a foot exam due to your diabetes. Lastly, we talked about your headaches, which seem to be improving with changes in your sleeping position.  YOUR PLAN:  -HIGH BLOOD PRESSURE: Your blood pressure was a bit high today. High blood pressure, or hypertension, is a condition where the force of blood against your artery walls is too high. We've decided to increase your Amlodipine dosage to help manage this. Please continue taking your Losartan/Hydrochlorothiazide as prescribed and monitor your blood pressure at home once your machine is set up.  -FOOT CARE IN DIABETES: [name] been having some discomfort with your feet, which can be a common issue for people with diabetes. It's important to get a foot exam to prevent any potential complications. Please schedule this exam as soon as possible and we'll review the results at your next visit.  -HEADACHES: You've been experiencing some mild headaches, which you believe may be related to your sleeping position. Since changing your sleeping position seems to be helping, please continue with this and monitor any changes.  INSTRUCTIONS:  Please follow the new medication plan for your high blood pressure, schedule a foot exam, and continue monitoring your headaches. We will follow up in 2 weeks to review your blood pressure control and foot exam results.

## 2022-09-01 NOTE — Assessment & Plan Note (Signed)
Blood pressure slightly elevated at 147/63 and 142/60. Currently on Amlodipine 2.5mg  and Losartan/Hydrochlorothiazide 100/25mg . No symptoms of dizziness or lightheadedness reported. -Increase Amlodipine to 5mg  daily (use up 2.5mg  tablets by taking two daily, then switch to 5mg  tablets). -Continue Losartan/Hydrochlorothiazide 100/25mg  daily. -Check blood pressure at home once machine is set up.

## 2022-09-02 DIAGNOSIS — I10 Essential (primary) hypertension: Secondary | ICD-10-CM | POA: Diagnosis not present

## 2022-09-02 DIAGNOSIS — E119 Type 2 diabetes mellitus without complications: Secondary | ICD-10-CM | POA: Diagnosis not present

## 2022-09-02 DIAGNOSIS — R32 Unspecified urinary incontinence: Secondary | ICD-10-CM | POA: Diagnosis not present

## 2022-09-02 DIAGNOSIS — M170 Bilateral primary osteoarthritis of knee: Secondary | ICD-10-CM | POA: Diagnosis not present

## 2022-09-16 DIAGNOSIS — M170 Bilateral primary osteoarthritis of knee: Secondary | ICD-10-CM | POA: Diagnosis not present

## 2022-09-16 DIAGNOSIS — E119 Type 2 diabetes mellitus without complications: Secondary | ICD-10-CM | POA: Diagnosis not present

## 2022-09-16 DIAGNOSIS — R32 Unspecified urinary incontinence: Secondary | ICD-10-CM | POA: Diagnosis not present

## 2022-09-16 DIAGNOSIS — I10 Essential (primary) hypertension: Secondary | ICD-10-CM | POA: Diagnosis not present

## 2022-09-17 ENCOUNTER — Ambulatory Visit (INDEPENDENT_AMBULATORY_CARE_PROVIDER_SITE_OTHER): Payer: Medicare HMO

## 2022-09-17 VITALS — BP 126/80 | Ht 61.0 in | Wt 164.5 lb

## 2022-09-17 DIAGNOSIS — Z0000 Encounter for general adult medical examination without abnormal findings: Secondary | ICD-10-CM | POA: Diagnosis not present

## 2022-09-17 NOTE — Progress Notes (Signed)
Subjective:   Cynthia Castillo is a 82 y.o. female who presents for Medicare Annual (Subsequent) preventive examination.  Visit Complete: In person  Patient Medicare AWV questionnaire was completed by the patient on (not done); I have confirmed that all information answered by patient is correct and no changes since this date.  Review of Systems    Cardiac Risk Factors include: advanced age (>64men, >80 women);diabetes mellitus;hypertension;dyslipidemia;obesity (BMI >30kg/m2);sedentary lifestyle    Objective:    Today's Vitals   09/17/22 1315  BP: 126/80  Weight: 164 lb 8 oz (74.6 kg)  Height: 5\' 1"  (1.549 m)   Body mass index is 31.08 kg/m.     09/17/2022    1:33 PM 09/16/2021    1:30 PM 01/03/2019    3:00 PM 12/30/2017    3:09 PM 12/23/2016    1:04 PM 12/05/2015    3:07 PM 06/19/2015   10:35 AM  Advanced Directives  Does Patient Have a Medical Advance Directive? Yes No Yes No No Yes Yes  Type of Advance Directive Living will;Healthcare Power of Attorney  Living will   Living will Living will  Copy of Healthcare Power of Attorney in Chart?      No - copy requested   Would patient like information on creating a medical advance directive?  No - Patient declined  No - Patient declined Yes (MAU/Ambulatory/Procedural Areas - Information given)      Current Medications (verified) Outpatient Encounter Medications as of 09/17/2022  Medication Sig   amLODipine (NORVASC) 5 MG tablet Take 1 tablet (5 mg total) by mouth daily.   losartan-hydrochlorothiazide (HYZAAR) 100-25 MG tablet Take 1 tablet by mouth daily.   Multiple Vitamins-Minerals (CENTRUM SILVER 50+WOMEN PO) Take by mouth daily.    No facility-administered encounter medications on file as of 09/17/2022.    Allergies (verified) Latex   History: Past Medical History:  Diagnosis Date   Arthritis    hands   Complication of anesthesia    Diabetes mellitus without complication (HCC) 2015   diet control   Hypertension     Murmur    PONV (postoperative nausea and vomiting)    Past Surgical History:  Procedure Laterality Date   ABDOMINAL HYSTERECTOMY  1972   APPENDECTOMY     BREAST CYST EXCISION Left 1972   COLONOSCOPY  2007   Dr Lemar Livings   COLONOSCOPY WITH PROPOFOL N/A 03/21/2015   Procedure: COLONOSCOPY WITH PROPOFOL;  Surgeon: Earline Mayotte, MD;  Location: [facility] END[facility]COPY;  Service: Endoscopy;  Laterality: N/A;   REPLACEMENT TOTAL KNEE BILATERAL     TOTAL KNEE ARTHROPLASTY Right 07/25/2014   Procedure: TOTAL KNEE ARTHROPLASTY;  Surgeon: Kennedy Bucker, MD;  Location: [facility] ORS;  Service: Orthopedics;  Laterality: Right;   TOTAL KNEE ARTHROPLASTY Left 06/19/2015   Procedure: TOTAL KNEE ARTHROPLASTY;  Surgeon: Kennedy Bucker, MD;  Location: [facility] ORS;  Service: Orthopedics;  Laterality: Left;   Family History  Problem Relation Age of Onset   Stroke Mother    Heart attack Father    Cancer Sister 64       stomach/Shirley   Cancer Sister 59       breast/Rosa Graves   Breast cancer Sister    Cancer Maternal Grandmother        unknown   Social History   Socioeconomic History   Marital status: Widowed    Spouse name: Not on file   Number of children: 1   Years of education: Not on file   Highest  education level: Some college, no degree  Occupational History   Occupation: retired  Tobacco Use   Smoking status: Never   Smokeless tobacco: Never  Vaping Use   Vaping status: Never Used  Substance and Sexual Activity   Alcohol use: No    Alcohol/week: 0.0 standard drinks of alcohol   Drug use: No   Sexual activity: Never  Other Topics Concern   Not on file  Social History Narrative   Not on file   Social Determinants of Health   Financial Resource Strain: Low Risk  (09/17/2022)   Overall Financial Resource Strain (CARDIA)    Difficulty of Paying Living Expenses: Not hard at all  Food Insecurity: No Food Insecurity (09/17/2022)   Hunger Vital Sign    Worried About Running Out of Food in the  Last Year: Never true    Ran Out of Food in the Last Year: Never true  Transportation Needs: No Transportation Needs (09/17/2022)   PRAPARE - Administrator, Civil Service (Medical): No    Lack of Transportation (Non-Medical): No  Physical Activity: Inactive (09/17/2022)   Exercise Vital Sign    Days of Exercise per Week: 0 days    Minutes of Exercise per Session: 0 min  Stress: No Stress Concern Present (09/17/2022)   [employer] of Occupational Health - Occupational Stress Questionnaire    Feeling of Stress : Not at all  Social Connections: Moderately Isolated (09/17/2022)   Social Connection and Isolation Panel [NHANES]    Frequency of Communication with Friends and Family: More than three times a week    Frequency of Social Gatherings with Friends and Family: Once a week    Attends Religious Services: More than 4 times per year    Active Member of [employer] or Organizations: No    Attends Banker Meetings: Never    Marital Status: Widowed    Tobacco Counseling Counseling given: Not Answered   Clinical Intake:  Pre-visit preparation completed: Yes  Pain : No/denies pain     BMI - recorded: 31.08 Nutritional Status: BMI > 30  Obese Nutritional Risks: None Diabetes: Yes CBG done?: No Did pt. bring in CBG monitor from home?: No  How often do you need to have someone help you when you read instructions, pamphlets, or other written materials from your doctor or pharmacy?: 1 - Never  Interpreter Needed?: No  Comments: lives alone Information entered by :: B.Kassaundra Hair,LPN   Activities of Daily Living    09/17/2022    1:33 PM 07/18/2022    2:35 PM  In your present state of health, do you have any difficulty performing the following activities:  Hearing? 0 1  Vision? 0 1  Difficulty concentrating or making decisions? 1 1  Walking or climbing stairs? 1 0  Dressing or bathing? 0 0  Doing errands, shopping? 1 0  Preparing Food and eating ? N    Using the Toilet? N   In the past six months, have you accidently leaked urine? N   Do you have problems with loss of bowel control? N   Managing your Medications? N   Managing your Finances? N   Housekeeping or managing your Housekeeping? Y     Patient Care Team: Ronnald Ramp, MD as PCP - General (Family Medicine) Lemar Livings, Merrily Pew, MD (General Surgery) Lockie Mola, MD as Referring Physician (Ophthalmology) Estanislado Pandy, MD as Referring Physician (Ophthalmology) Pa, [facility] (Optometry)  Indicate any recent Medical Services you  may have received from other than [facility] providers in the past year (date may be approximate).     Assessment:   This is a routine wellness examination for Cynthia Castillo.  Hearing/Vision screen Hearing Screening - Comments:: Adequate hearing Vision Screening - Comments:: Adequate vision-[facility]  Dietary issues and exercise activities discussed:     Goals Addressed   None    Depression Screen    09/17/2022    1:30 PM 08/01/2022    2:45 PM 07/18/2022    2:35 PM 06/25/2022    1:15 PM 05/23/2022    3:21 PM 02/17/2022    1:13 PM 09/16/2021    1:28 PM  PHQ 2/9 Scores  PHQ - 2 Score 0 0 0 1 0 0 1  PHQ- 9 Score   2 4 1  2     Fall Risk    09/17/2022    1:25 PM 08/01/2022    2:45 PM 06/25/2022    1:15 PM 05/23/2022    3:21 PM 02/17/2022    1:13 PM  Fall Risk   Falls in the past year? 0 1 0 0 0  Number falls in past yr: 0 0 0 0 0  Injury with Fall? 0 0 0 0 0  Risk for fall due to : Impaired mobility;Impaired balance/gait History of fall(s) Medication side effect  No Fall Risks  Follow up Education provided;Falls prevention discussed Falls evaluation completed Falls evaluation completed      MEDICARE RISK AT HOME: Medicare Risk at Home Any stairs in or around the home?: No If so, are there any without handrails?: No Home free of loose throw rugs in walkways, pet beds, electrical cords, etc?: Yes Adequate lighting in  your home to reduce risk of falls?: Yes Life alert?: Yes Use of a cane, walker or w/c?: Yes Grab bars in the bathroom?: No Shower chair or bench in shower?: No Elevated toilet seat or a handicapped toilet?: No  TIMED UP AND GO:  Was the test performed?  Yes  Length of time to ambulate 10 feet: 15 sec Gait slow and steady with assistive device    Cognitive Function:    04/10/2021   11:32 AM  MMSE - Mini Mental State Exam  Orientation to time 5  Orientation to Place 5  Registration 3  Attention/ Calculation 5  Recall 3  Language- name 2 objects 2  Language- repeat 1  Language- follow 3 step command 3  Language- read & follow direction 1  Write a sentence 1  Copy design 1  Total score 30        09/17/2022    1:39 PM 12/30/2017    3:14 PM 12/05/2015    3:11 PM  6CIT Screen  What Year? 0 points 0 points 0 points  What month? 0 points 0 points 0 points  What time? 0 points 0 points 0 points  Count back from 20 0 points 0 points 0 points  Months in reverse 0 points 2 points 2 points  Repeat phrase 0 points 0 points 4 points  Total Score 0 points 2 points 6 points    Immunizations Immunization History  Administered Date(s) Administered   Fluad Quad(high Dose 65+) 01/03/2019, 02/17/2022   Influenza, High Dose Seasonal PF 10/23/2014, 09/17/2015, 12/23/2016, 12/30/2017   Pneumococcal Conjugate-13 08/16/2013   Pneumococcal Polysaccharide-23 11/20/2010   Td 08/25/2003   Tdap 10/20/2012   Zoster, Live 10/20/2012    TDAP status: Up to date  Flu Vaccine status: Up to date  Pneumococcal vaccine status: Up to date  Covid-19 vaccine status: Declined, Education has been provided regarding the importance of this vaccine but patient still declined. Advised may receive this vaccine at local pharmacy or Health Dept.or vaccine clinic. Aware to provide a copy of the vaccination record if obtained from local pharmacy or Health Dept. Verbalized acceptance and  understanding.  Qualifies for Shingles Vaccine? Yes   Zostavax completed No   Shingrix Completed?: No.    Education has been provided regarding the importance of this vaccine. Patient has been advised to call insurance company to determine out of pocket expense if they have not yet received this vaccine. Advised may also receive vaccine at local pharmacy or Health Dept. Verbalized acceptance and understanding.  Screening Tests Health Maintenance  Topic Date Due   Zoster Vaccines- Shingrix (1 of 2) 11/25/1991   COVID-19 Vaccine (1 - 2023-24 season) Never done   OPHTHALMOLOGY EXAM  05/02/2022   INFLUENZA VACCINE  08/21/2022   DTaP/Tdap/Td (3 - Td or Tdap) 10/21/2022   FOOT EXAM  10/26/2022   HEMOGLOBIN A1C  11/23/2022   Diabetic kidney evaluation - Urine ACR  02/12/2023   Diabetic kidney evaluation - eGFR measurement  05/23/2023   Medicare Annual Wellness (AWV)  09/17/2023   Pneumonia Vaccine 36+ Years old  Completed   DEXA SCAN  Completed   HPV VACCINES  Aged Out    Health Maintenance  Health Maintenance Due  Topic Date Due   Zoster Vaccines- Shingrix (1 of 2) 11/25/1991   COVID-19 Vaccine (1 - 2023-24 season) Never done   OPHTHALMOLOGY EXAM  05/02/2022   INFLUENZA VACCINE  08/21/2022    Colorectal cancer screening: No longer required.   Mammogram status: No longer required due to age.  Bone Density status: Completed yes. Results reflect: Bone density results: NORMAL. Repeat every 5 years.  Lung Cancer Screening: (Low Dose CT Chest recommended if Age 106-80 years, 20 pack-year currently smoking OR have quit w/in 15years.) does not qualify.   Lung Cancer Screening Referral: no  Additional Screening:  Hepatitis C Screening: does not qualify; Completed yes  Vision Screening: Recommended annual ophthalmology exams for early detection of glaucoma and other disorders of the eye. Is the patient up to date with their annual eye exam?  Yes  Who is the provider or what is the  name of the office in which the patient attends annual eye exams? [facility] If pt is not established with a provider, would they like to be referred to a provider to establish care? No .   Dental Screening: Recommended annual dental exams for proper oral hygiene  Diabetic Foot Exam: Diabetic Foot Exam: Completed yes  Community Resource Referral / Chronic Care Management: CRR required this visit?  No   CCM required this visit?  No    Plan:     I have personally reviewed and noted the following in the patient's chart:   Medical and social history Use of alcohol, tobacco or illicit drugs  Current medications and supplements including opioid prescriptions. Patient is not currently taking opioid prescriptions. Functional ability and status Nutritional status Physical activity Advanced directives List of other physicians Hospitalizations, surgeries, and ER visits in previous 12 months Vitals Screenings to include cognitive, depression, and falls Referrals and appointments  In addition, I have reviewed and discussed with patient certain preventive protocols, quality metrics, and best practice recommendations. A written personalized care plan for preventive services as well as general preventive health recommendations were provided to patient.  Sue Lush, LPN   04/28/8117   After Visit Summary: Printed and given to pt  Nurse Notes: The patient states she is doing well and has no concerns or questions at this time.

## 2022-09-17 NOTE — Patient Instructions (Addendum)
Cynthia Castillo , Thank you for taking time to come for your Medicare Wellness Visit. I appreciate your ongoing commitment to your health goals. Please review the following plan we discussed and let me know if I can assist you in the future.   Referrals/Orders/Follow-Ups/Clinician Recommendations: none  This is a list of the screening recommended for you and due dates:  Health Maintenance  Topic Date Due   Zoster (Shingles) Vaccine (1 of 2) 11/25/1991   COVID-19 Vaccine (1 - 2023-24 season) Never done   Eye exam for diabetics  05/02/2022   Flu Shot  08/21/2022   DTaP/Tdap/Td vaccine (3 - Td or Tdap) 10/21/2022   Complete foot exam   10/26/2022   Hemoglobin A1C  11/23/2022   Yearly kidney health urinalysis for diabetes  02/12/2023   Yearly kidney function blood test for diabetes  05/23/2023   Medicare Annual Wellness Visit  09/17/2023   Pneumonia Vaccine  Completed   DEXA scan (bone density measurement)  Completed   HPV Vaccine  Aged Out    Advanced directives: (Copy Requested) Please bring a copy of your health care power of attorney and living will to the office to be added to your chart at your convenience.  Next Medicare Annual Wellness Visit scheduled for next year: Yes 09/23/23 @ 1:30pm in person

## 2022-09-23 ENCOUNTER — Encounter: Payer: Self-pay | Admitting: Family Medicine

## 2022-09-23 ENCOUNTER — Ambulatory Visit (INDEPENDENT_AMBULATORY_CARE_PROVIDER_SITE_OTHER): Payer: Medicare HMO | Admitting: Family Medicine

## 2022-09-23 VITALS — BP 137/62 | HR 75 | Wt 164.5 lb

## 2022-09-23 DIAGNOSIS — E039 Hypothyroidism, unspecified: Secondary | ICD-10-CM | POA: Diagnosis not present

## 2022-09-23 DIAGNOSIS — I10 Essential (primary) hypertension: Secondary | ICD-10-CM

## 2022-09-23 DIAGNOSIS — N181 Chronic kidney disease, stage 1: Secondary | ICD-10-CM | POA: Diagnosis not present

## 2022-09-23 DIAGNOSIS — Z0000 Encounter for general adult medical examination without abnormal findings: Secondary | ICD-10-CM

## 2022-09-23 DIAGNOSIS — E1122 Type 2 diabetes mellitus with diabetic chronic kidney disease: Secondary | ICD-10-CM | POA: Diagnosis not present

## 2022-09-23 NOTE — Assessment & Plan Note (Signed)
Well controlled with diet, last A1c was 6.0 in May 2024. No reported complications. -Chronic,well controlled, stable -Schedule eye exam at [facility]. -Schedule diabetic foot exam at [facility]. -Continue dietary management.

## 2022-09-23 NOTE — Assessment & Plan Note (Signed)
Chronic problem  Stable  No current medicaitons No medication changes today

## 2022-09-23 NOTE — Assessment & Plan Note (Signed)
Controlled with Amlodipine 5mg  daily and Losartan-Hydrochlorothiazide 100-25mg  daily. No reported side effects. -Chronic,well controlled -Continue current medication regimen.

## 2022-09-23 NOTE — Progress Notes (Signed)
Established patient visit   Patient: Cynthia Castillo   DOB: 27-Jul-1940   82 y.o. Female  MRN: [id number] Visit Date: 09/23/2022  Today's healthcare provider: Ronnald Ramp, MD   Chief Complaint  Patient presents with   Medical Management of Chronic Issues    2 week htn f/u with patient taking medications as prescribed and no symptoms to report   Subjective     HPI     Medical Management of Chronic Issues    Additional comments: 2 week htn f/u with patient taking medications as prescribed and no symptoms to report      Last edited by Acey Lav, CMA on 09/23/2022  2:06 PM.       Discussed the use of AI scribe software for clinical note transcription with the patient, who gave verbal consent to proceed.  History of Present Illness   The patient, with a history of hypertension, diabetes, and hypothyroidism, reports no new health issues since the last visit. She has been compliant with her prescribed medications, including Amlodipine 5mg  and Losartan-Hydrochlorothiazide 100-25mg  for hypertension. She denies any adverse effects such as dizziness or fainting spells. However, she has been experiencing difficulties with her blood pressure monitoring device.  The patient's diabetes is diet-controlled, with the most recent HbA1c at 6.0, indicating good control. She has been advised to continue with dietary management. The patient's hypothyroidism, which was within normal limits earlier this year, is not currently being treated with medication.  The patient has been advised to schedule appointments for a diabetes foot exam and an eye exam due to the potential complications of diabetes. She has not yet been able to schedule these appointments due to confusion over the phone. The patient has also been offered a flu vaccine but has declined due to a fear of injections.  The patient uses a cane for minimal assistance with ambulation and reports feeling better overall since  starting her current treatment regimen.      Pt reports going to [facility] for [facility] exams    Medications: Outpatient Medications Prior to Visit  Medication Sig   amLODipine (NORVASC) 5 MG tablet Take 1 tablet (5 mg total) by mouth daily.   losartan-hydrochlorothiazide (HYZAAR) 100-25 MG tablet Take 1 tablet by mouth daily.   Multiple Vitamins-Minerals (CENTRUM SILVER 50+WOMEN PO) Take by mouth daily.    No facility-administered medications prior to visit.    Review of Systems  Last metabolic panel Lab Results  Component Value Date   GLUCOSE 99 05/23/2022   NA 141 05/23/2022   K 4.2 05/23/2022   CL 108 (H) 05/23/2022   CO2 19 (L) 05/23/2022   BUN 13 05/23/2022   CREATININE 1.10 (H) 05/23/2022   EGFR 51 (L) 05/23/2022   CALCIUM 9.1 05/23/2022   PROT 7.6 02/11/2022   ALBUMIN 4.3 02/11/2022   LABGLOB 3.3 02/11/2022   AGRATIO 1.3 02/11/2022   BILITOT 0.4 02/11/2022   ALKPHOS 84 02/11/2022   AST 20 02/11/2022   ALT 9 02/11/2022   ANIONGAP 6 06/20/2015        Objective    BP 137/62 (BP Location: Left Arm, Patient Position: Sitting, Cuff Size: Normal)   Pulse 75   Wt 164 lb 8 oz (74.6 kg)   SpO2 99%   BMI 31.08 kg/m     Physical Exam Vitals reviewed.  Constitutional:      General: She is not in acute distress.    Appearance: Normal appearance. She is not  ill-appearing, toxic-appearing or diaphoretic.  Eyes:     Conjunctiva/sclera: Conjunctivae normal.  Cardiovascular:     Rate and Rhythm: Normal rate and regular rhythm.     Pulses: Normal pulses.     Heart sounds: Normal heart sounds. No murmur heard.    No friction rub. No gallop.  Pulmonary:     Effort: Pulmonary effort is normal. No respiratory distress.     Breath sounds: Normal breath sounds. No stridor. No wheezing, rhonchi or rales.  Abdominal:     General: Bowel sounds are normal. There is no distension.     Palpations: Abdomen is soft.     Tenderness: There is no abdominal tenderness.   Musculoskeletal:     Right lower leg: No edema.     Left lower leg: No edema.  Neurological:     Mental Status: She is alert and oriented to person, place, and time.      No results found for any visits on 09/23/22.  Assessment & Plan     Problem List Items Addressed This Visit     Adult hypothyroidism    Chronic problem  Stable  No current medicaitons No medication changes today          Diabetes mellitus, type 2 (HCC)    Well controlled with diet, last A1c was 6.0 in May 2024. No reported complications. -Chronic,well controlled, stable -Schedule eye exam at [facility]. -Schedule diabetic foot exam at [facility]. -Continue dietary management.      Essential (primary) hypertension - Primary    Controlled with Amlodipine 5mg  daily and Losartan-Hydrochlorothiazide 100-25mg  daily. No reported side effects. -Chronic,well controlled -Continue current medication regimen.      Healthcare maintenance    Declined influenza vaccine today        Lab Results  Component Value Date   HGBA1C 6.0 (H) 05/23/2022      General Health Maintenance -Consider receiving flu vaccine. Patient declined at this visit. -Schedule follow-up appointment in 4 months (January 2025).         Return in about 4 months (around 01/23/2023) for HTN, DM,Thyroid .         Ronnald Ramp, MD  [facility] [phone] (phone) [phone] (fax)  [facility]

## 2022-09-23 NOTE — Assessment & Plan Note (Signed)
Declined influenza vaccine today. 

## 2022-09-23 NOTE — Patient Instructions (Addendum)
Diabetes EYE Exam  [facility]  [address], [place], [place] [address] Phone: [phone]  [facility] for diabetes foot exam   [address] [place],  [place]  [address]  Main: [phone]  VISIT SUMMARY:  During your visit, we discussed your hypertension, diabetes, and hypothyroidism. You reported no new health issues and have been compliant with your medications. Your blood pressure is well controlled with Amlodipine and Losartan-Hydrochlorothiazide, and your diabetes is well managed with diet. Your hypothyroidism is currently within normal limits and does not require medication. You have been advised to schedule appointments for a diabetes foot exam and an eye exam, but have had difficulties doing so. You declined the flu vaccine due to a fear of injections.  YOUR PLAN:  -HYPERTENSION: Hypertension, or high blood pressure, is well controlled with your current medications. Continue taking Amlodipine 5mg  daily and Losartan-Hydrochlorothiazide 100-25mg  daily.  -DIABETES MELLITUS: Your diabetes is well controlled with diet. Continue with your dietary management. It's important to schedule an eye exam at [facility] and a diabetic foot exam at [facility] to prevent potential complications.  -GENERAL HEALTH MAINTENANCE: For your overall health, consider receiving the flu vaccine. We also need to schedule a follow-up appointment in 4 months (January 2025).  INSTRUCTIONS:  Please try to schedule your eye exam at [facility] and your diabetic foot exam at [facility] as soon as possible. If you have any difficulties, let us know so we can assist you. Also, consider getting the flu vaccine for your protection. We will see you again for a follow-up appointment in January 2025.

## 2022-09-30 DIAGNOSIS — M170 Bilateral primary osteoarthritis of knee: Secondary | ICD-10-CM | POA: Diagnosis not present

## 2022-09-30 DIAGNOSIS — I10 Essential (primary) hypertension: Secondary | ICD-10-CM | POA: Diagnosis not present

## 2022-09-30 DIAGNOSIS — E119 Type 2 diabetes mellitus without complications: Secondary | ICD-10-CM | POA: Diagnosis not present

## 2022-09-30 DIAGNOSIS — R32 Unspecified urinary incontinence: Secondary | ICD-10-CM | POA: Diagnosis not present

## 2023-01-26 ENCOUNTER — Ambulatory Visit: Payer: Medicare HMO | Admitting: Family Medicine

## 2023-01-26 NOTE — Progress Notes (Deleted)
 Established patient visit   Patient: Cynthia Castillo   DOB: 26-Dec-1940   83 y.o. Female  MRN: [id number] Visit Date: 01/26/2023  Today's healthcare provider: Rockie Agent, MD   No chief complaint on file.  Subjective       Discussed the use of AI scribe software for clinical note transcription with the patient, who gave verbal consent to proceed.  History of Present Illness             Past Medical History:  Diagnosis Date   Arthritis    hands   Complication of anesthesia    Diabetes mellitus without complication (HCC) 2015   diet control   Hypertension    Murmur    PONV (postoperative nausea and vomiting)     Medications: Outpatient Medications Prior to Visit  Medication Sig   amLODipine  (NORVASC ) 5 MG tablet Take 1 tablet (5 mg total) by mouth daily.   losartan -hydrochlorothiazide  (HYZAAR ) 100-25 MG tablet Take 1 tablet by mouth daily.   Multiple Vitamins-Minerals (CENTRUM SILVER 50+WOMEN PO) Take by mouth daily.    No facility-administered medications prior to visit.    Review of Systems  Last CBC Lab Results  Component Value Date   WBC 4.4 09/13/2020   HGB 13.3 09/13/2020   HCT 42.2 09/13/2020   MCV 80 09/13/2020   MCH 25.0 (L) 09/13/2020   RDW 15.2 09/13/2020   PLT 335 09/13/2020   Last metabolic panel Lab Results  Component Value Date   GLUCOSE 99 05/23/2022   NA 141 05/23/2022   K 4.2 05/23/2022   CL 108 (H) 05/23/2022   CO2 19 (L) 05/23/2022   BUN 13 05/23/2022   CREATININE 1.10 (H) 05/23/2022   EGFR 51 (L) 05/23/2022   CALCIUM  9.1 05/23/2022   PROT 7.6 02/11/2022   ALBUMIN 4.3 02/11/2022   LABGLOB 3.3 02/11/2022   AGRATIO 1.3 02/11/2022   BILITOT 0.4 02/11/2022   ALKPHOS 84 02/11/2022   AST 20 02/11/2022   ALT 9 02/11/2022   ANIONGAP 6 06/20/2015   Last lipids Lab Results  Component Value Date   CHOL 198 09/13/2020   HDL 60 09/13/2020   LDLCALC 120 (H) 09/13/2020   TRIG 103 09/13/2020   CHOLHDL 3.3  09/13/2020   Last hemoglobin A1c Lab Results  Component Value Date   HGBA1C 6.0 (H) 05/23/2022   Last thyroid  functions Lab Results  Component Value Date   TSH 2.580 02/11/2022   Last vitamin D No results found for: 25OHVITD2, 25OHVITD3, VD25OH   {See past labs  Heme  Chem  Endocrine  Serology  Results Review (optional):1}   Objective    There were no vitals taken for this visit. BP Readings from Last 3 Encounters:  09/23/22 137/62  09/17/22 126/80  09/01/22 (!) 142/60   Wt Readings from Last 3 Encounters:  09/23/22 164 lb 8 oz (74.6 kg)  09/17/22 164 lb 8 oz (74.6 kg)  08/01/22 167 lb (75.8 kg)    {See vitals history (optional):1}    Physical Exam  ***  No results found for any visits on 01/26/23.  Assessment & Plan     Problem List Items Addressed This Visit       Cardiovascular and Mediastinum   Essential (primary) hypertension - Primary     Endocrine   Diabetes mellitus, type 2 (HCC)   Adult hypothyroidism     Musculoskeletal and Integument   Primary osteoarthritis of left knee     Other  HLD (hyperlipidemia)   Avitaminosis D    Assessment and Plan              No follow-ups on file.         Rockie Agent, MD  [facility] [phone] (phone) [phone] (fax)  [facility]

## 2023-01-30 ENCOUNTER — Encounter: Payer: Self-pay | Admitting: Family Medicine

## 2023-01-30 ENCOUNTER — Telehealth: Payer: Self-pay | Admitting: Family Medicine

## 2023-01-30 NOTE — Telephone Encounter (Signed)
 called-missed appt-no answer-vm box full-no mychart set up-letter was sent

## 2023-02-17 ENCOUNTER — Ambulatory Visit (INDEPENDENT_AMBULATORY_CARE_PROVIDER_SITE_OTHER): Payer: Medicare HMO | Admitting: Family Medicine

## 2023-02-17 ENCOUNTER — Encounter: Payer: Self-pay | Admitting: Family Medicine

## 2023-02-17 VITALS — BP 176/63 | HR 80 | Ht 61.0 in | Wt 166.0 lb

## 2023-02-17 DIAGNOSIS — N181 Chronic kidney disease, stage 1: Secondary | ICD-10-CM | POA: Diagnosis not present

## 2023-02-17 DIAGNOSIS — E039 Hypothyroidism, unspecified: Secondary | ICD-10-CM

## 2023-02-17 DIAGNOSIS — E1122 Type 2 diabetes mellitus with diabetic chronic kidney disease: Secondary | ICD-10-CM

## 2023-02-17 DIAGNOSIS — E7800 Pure hypercholesterolemia, unspecified: Secondary | ICD-10-CM

## 2023-02-17 DIAGNOSIS — I10 Essential (primary) hypertension: Secondary | ICD-10-CM

## 2023-02-17 MED ORDER — LOSARTAN POTASSIUM-HCTZ 100-25 MG PO TABS: 1.0000 | ORAL_TABLET | Freq: Every day | ORAL | 1 refills | Status: DC

## 2023-02-17 MED ORDER — AMLODIPINE BESYLATE 5 MG PO TABS
5.0000 mg | ORAL_TABLET | Freq: Every day | ORAL | 2 refills | Status: DC
Start: 2023-02-17 — End: 2023-06-03

## 2023-02-17 NOTE — Progress Notes (Signed)
Established patient visit   Patient: Cynthia Castillo   DOB: 06/12/1940   83 y.o. Female  MRN: [id number] Visit Date: 02/17/2023  Today's healthcare provider: Ronnald Ramp, MD   Chief Complaint  Patient presents with   Follow-up    HTN, DM,Thyroid   Subjective     HPI     Follow-up    Additional comments: HTN, DM,Thyroid      Last edited by Shelly Bombard, CMA on 02/17/2023  1:41 PM.       Discussed the use of AI scribe software for clinical note transcription with the patient, who gave verbal consent to proceed.  History of Present Illness   The patient is an 83 year old female with hypertension, diabetes, hypothyroidism, and hypercholesterolemia who presents for follow-up.  She is here for a follow-up on her chronic conditions, including hypertension, diabetes, hypothyroidism, and hypercholesterolemia. Her blood pressure is elevated today at 176/63, and she did not take her medication prior to the appointment. Her current hypertension regimen includes amlodipine 5 mg daily, losartan 100 mg, and hydrochlorothiazide 25 mg daily. Her blood pressure was around the 140s when she was in [place].  Her diabetes was well controlled with a hemoglobin A1c of 6.0 in May 2024. She is due for a recheck of her A1c today. No new medications and no issues with her feet or eyes related to her diabetes. She mentions that her feet feel different, with sensations moving to her toes, but they do not hurt much. She uses [employer] moisturizer for her skin, which is dry and hyperkeratotic, particularly on her feet. She has not seen a podiatrist recently and has difficulty with toenail care.  Her last TSH was 2.580 in January 2024. She is due for a recheck of her thyroid levels today. No new symptoms related to her hypothyroidism.  Her last creatinine was 1.10 with a GFR of 51 in May 2024. She is due for a metabolic panel today to check her kidney function and electrolytes.          Past Medical History:  Diagnosis Date   Arthritis    hands   Complication of anesthesia    Diabetes mellitus without complication (HCC) 2015   diet control   Hypertension    Murmur    PONV (postoperative nausea and vomiting)     Medications: Outpatient Medications Prior to Visit  Medication Sig   Multiple Vitamins-Minerals (CENTRUM SILVER 50+WOMEN PO) Take by mouth daily.    [DISCONTINUED] amLODipine (NORVASC) 5 MG tablet Take 1 tablet (5 mg total) by mouth daily.   [DISCONTINUED] losartan-hydrochlorothiazide (HYZAAR) 100-25 MG tablet Take 1 tablet by mouth daily.   No facility-administered medications prior to visit.    Review of Systems  Last metabolic panel Lab Results  Component Value Date   GLUCOSE 99 05/23/2022   NA 141 05/23/2022   K 4.2 05/23/2022   CL 108 (H) 05/23/2022   CO2 19 (L) 05/23/2022   BUN 13 05/23/2022   CREATININE 1.10 (H) 05/23/2022   EGFR 51 (L) 05/23/2022   CALCIUM 9.1 05/23/2022   PROT 7.6 02/11/2022   ALBUMIN 4.3 02/11/2022   LABGLOB 3.3 02/11/2022   AGRATIO 1.3 02/11/2022   BILITOT 0.4 02/11/2022   ALKPHOS 84 02/11/2022   AST 20 02/11/2022   ALT 9 02/11/2022   ANIONGAP 6 06/20/2015   Last hemoglobin A1c Lab Results  Component Value Date   HGBA1C 6.0 (H) 05/23/2022   Last thyroid  functions Lab Results  Component Value Date   TSH 2.580 02/11/2022        Objective    BP (!) 176/63 (BP Location: Right Arm, Patient Position: Sitting, Cuff Size: Normal)   Pulse 80   Ht 5\' 1"  (1.549 m)   Wt 166 lb (75.3 kg)   SpO2 97%   BMI 31.37 kg/m  BP Readings from Last 3 Encounters:  02/17/23 (!) 176/63  09/23/22 137/62  09/17/22 126/80   Wt Readings from Last 3 Encounters:  02/17/23 166 lb (75.3 kg)  09/23/22 164 lb 8 oz (74.6 kg)  09/17/22 164 lb 8 oz (74.6 kg)        Physical Exam Feet:     Right foot:     Skin integrity: Dry skin present.     Toenail Condition: Right toenails are abnormally thick and long.  Fungal disease present.    Left foot:     Skin integrity: Dry skin present.     Toenail Condition: Left toenails are abnormally thick and long. Fungal disease present.    Comments: Hypertrophic skin on dorsum aspect of bilateral feet, long and curved toenails, left great toenail has grown and is curved inwards towards 3-4th toes, debris on soles of feet     Physical Exam   VITALS: BP- 176/63 CHEST: Lungs clear to auscultation. CARDIOVASCULAR: Heart rhythm regular, no murmurs detected. SKIN: Dry skin on feet, hyperkeratosis noted.       No results found for any visits on 02/17/23.  Assessment & Plan     Problem List Items Addressed This Visit       Cardiovascular and Mediastinum   Essential (primary) hypertension   Chronic hypertension with elevated blood pressure today at 176/63. Did not take medication prior to the appointment, which may have contributed to the elevated reading. Previous readings in [place] were around 140s systolic. Goal is to achieve systolic readings in the 130s and diastolic under 90. Discussed the importance of medication adherence and potential risks of uncontrolled hypertension, including stroke and heart disease. - Recheck blood pressure manually - Continue amlodipine 5 mg daily - Start losartan 100 mg daily - Continue hydrochlorothiazide 25 mg daily - Order metabolic panel      Relevant Medications   amLODipine (NORVASC) 5 MG tablet   losartan-hydrochlorothiazide (HYZAAR) 100-25 MG tablet   Other Relevant Orders   BMP8+EGFR     Endocrine   Diabetes mellitus, type 2 (HCC) - Primary   Well-controlled with last A1c at 6.0 in May 2024. Reports improvement in foot sensation but has not seen a podiatrist. Discussed the importance of regular foot exams to prevent complications such as ulcers and infections. - Order hemoglobin A1c - Perform foot exam - Refer to podiatry for toenail clipping and hyperkeratotic skin management - Order urine albumin  test - Schedule diabetes eye exam      Relevant Medications   losartan-hydrochlorothiazide (HYZAAR) 100-25 MG tablet   Other Relevant Orders   Hemoglobin A1c   Ambulatory referral to Podiatry   Adult hypothyroidism   Well-managed with last TSH at 2.580 in January 2024. No new symptoms reported. Discussed the importance of regular monitoring to ensure appropriate thyroid function. chronic - Order TSH, T4, and T3 levels      Relevant Orders   TSH+T4F+T3Free     Other   HLD (hyperlipidemia)   Relevant Medications   amLODipine (NORVASC) 5 MG tablet   losartan-hydrochlorothiazide (HYZAAR) 100-25 MG tablet     Follow-up - Schedule  podiatry appointment-- scheduled by referral coordinator 03/23/23, pt did not answer phone but patient's daughter informed of scheduled appointment  - Schedule diabetes eye exam - Schedule follow-up appointment in May 2025.         Return in about 4 months (around 06/17/2023) for DM, HTN,thyroid.         Ronnald Ramp, MD  [facility] [phone] (phone) [phone] (fax)  [facility]

## 2023-02-17 NOTE — Assessment & Plan Note (Signed)
Chronic hypertension with elevated blood pressure today at 176/63. Did not take medication prior to the appointment, which may have contributed to the elevated reading. Previous readings in [place] were around 140s systolic. Goal is to achieve systolic readings in the 130s and diastolic under 90. Discussed the importance of medication adherence and potential risks of uncontrolled hypertension, including stroke and heart disease. - Recheck blood pressure manually - Continue amlodipine 5 mg daily - Start losartan 100 mg daily - Continue hydrochlorothiazide 25 mg daily - Order metabolic panel

## 2023-02-17 NOTE — Addendum Note (Signed)
Addended by: Bing Neighbors on: 02/17/2023 04:44 PM   Modules accepted: Level of Service

## 2023-02-17 NOTE — Assessment & Plan Note (Signed)
Well-managed with last TSH at 2.580 in January 2024. No new symptoms reported. Discussed the importance of regular monitoring to ensure appropriate thyroid function. chronic - Order TSH, T4, and T3 levels

## 2023-02-17 NOTE — Assessment & Plan Note (Signed)
Chronic hypercholesterolemia. Discussed the importance of lipid management to reduce cardiovascular risk. -lipid panel ordered

## 2023-02-17 NOTE — Assessment & Plan Note (Signed)
Well-controlled with last A1c at 6.0 in May 2024. Reports improvement in foot sensation but has not seen a podiatrist. Discussed the importance of regular foot exams to prevent complications such as ulcers and infections. - Order hemoglobin A1c - Perform foot exam - Refer to podiatry for toenail clipping and hyperkeratotic skin management - Order urine albumin test - Schedule diabetes eye exam

## 2023-02-18 LAB — TSH+T4F+T3FREE
Free T4: 1.52 ng/dL (ref 0.82–1.77)
T3, Free: 2.6 pg/mL (ref 2.0–4.4)
TSH: 1.93 u[IU]/mL (ref 0.450–4.500)

## 2023-02-18 LAB — BMP8+EGFR
BUN/Creatinine Ratio: 13 (ref 12–28)
BUN: 16 mg/dL (ref 8–27)
CO2: 20 mmol/L (ref 20–29)
Calcium: 9.9 mg/dL (ref 8.7–10.3)
Chloride: 105 mmol/L (ref 96–106)
Creatinine, Ser: 1.27 mg/dL — ABNORMAL HIGH (ref 0.57–1.00)
Glucose: 112 mg/dL — ABNORMAL HIGH (ref 70–99)
Potassium: 4.3 mmol/L (ref 3.5–5.2)
Sodium: 141 mmol/L (ref 134–144)
eGFR: 42 mL/min/{1.73_m2} — ABNORMAL LOW (ref >59)

## 2023-02-18 LAB — HEMOGLOBIN A1C
Est. average glucose Bld gHb Est-mCnc: 120 mg/dL
Hgb A1c MFr Bld: 5.8 % — ABNORMAL HIGH (ref 4.8–5.6)

## 2023-03-23 ENCOUNTER — Telehealth: Payer: Self-pay | Admitting: Podiatry

## 2023-03-23 ENCOUNTER — Ambulatory Visit: Payer: Medicare HMO | Admitting: Podiatry

## 2023-03-23 ENCOUNTER — Encounter: Payer: Self-pay | Admitting: Podiatry

## 2023-03-23 VITALS — Ht 61.0 in | Wt 166.0 lb

## 2023-03-23 DIAGNOSIS — E1122 Type 2 diabetes mellitus with diabetic chronic kidney disease: Secondary | ICD-10-CM

## 2023-03-23 DIAGNOSIS — L602 Onychogryphosis: Secondary | ICD-10-CM

## 2023-03-23 DIAGNOSIS — M2012 Hallux valgus (acquired), left foot: Secondary | ICD-10-CM

## 2023-03-23 DIAGNOSIS — M79671 Pain in right foot: Secondary | ICD-10-CM

## 2023-03-23 DIAGNOSIS — E119 Type 2 diabetes mellitus without complications: Secondary | ICD-10-CM

## 2023-03-23 DIAGNOSIS — N181 Chronic kidney disease, stage 1: Secondary | ICD-10-CM | POA: Diagnosis not present

## 2023-03-23 DIAGNOSIS — M2011 Hallux valgus (acquired), right foot: Secondary | ICD-10-CM | POA: Diagnosis not present

## 2023-03-23 DIAGNOSIS — B353 Tinea pedis: Secondary | ICD-10-CM

## 2023-03-23 DIAGNOSIS — M79672 Pain in left foot: Secondary | ICD-10-CM

## 2023-03-23 MED ORDER — KETOCONAZOLE 2 % EX CREA: TOPICAL_CREAM | CUTANEOUS | 1 refills | Status: AC

## 2023-03-23 NOTE — Telephone Encounter (Signed)
 Pts daughter called and left message with concerns about her moms appt today.  I returned call and pts mom does have some memory issues and sometimes will not let daughter come in exam room with her. PTs daughter would like to come in exam room if pt allows.

## 2023-03-23 NOTE — Patient Instructions (Signed)
To prevent reinfection, spray shoes with lysol every evening.  Clean tub or shower with bleach based cleanser.  Athlete's Foot Athlete's foot (tinea pedis) is a fungal infection of the skin on your feet. It often occurs on the skin that is between or underneath the toes. It can also occur on the soles of your feet. The infection can spread from person to person (is contagious). It can also spread when a person's bare feet come in contact with the fungus on shower floors or on items such as shoes. What are the causes? This condition is caused by a fungus that grows in warm, moist places. You can get athlete's foot by sharing shoes, shower stalls, towels, and wet floors with someone who is infected. Not washing your feet or changing your socks often enough can also lead to athlete's foot. What increases the risk? This condition is more likely to develop in: Men. People who have a weak body defense system (immune system). People who have diabetes. People who use public showers, such as at a gym. People who wear heavy-duty shoes, such as Youth worker. Seasons with warm, humid weather. What are the signs or symptoms? Symptoms of this condition include: Itchy areas between your toes or on the soles of your feet. White, flaky, or scaly areas between your toes or on the soles of your feet. Very itchy small blisters between your toes or on the soles of your feet. Small cuts in your skin. These cuts can become infected. Thick or discolored toenails. How is this diagnosed? This condition may be diagnosed with a physical exam and a review of your medical history. Your health care provider may also take a skin or toenail sample to examine under a microscope. How is this treated? This condition is treated with antifungal medicines. These may be applied as powders, ointments, or creams. In severe cases, an oral antifungal medicine may be given. Follow these instructions at  home: Medicines Apply or take over-the-counter and prescription medicines only as told by your health care provider. Apply your antifungal medicine as told by your health care provider. Do not stop using the antifungal even if your condition improves. Foot care Do not scratch your feet. Keep your feet dry: Wear cotton or wool socks. Change your socks every day or if they become wet. Wear shoes that allow air to flow, such as sandals or canvas tennis shoes. Wash and dry your feet, including the area between your toes. Also, wash and dry your feet: Every day or as told by your health care provider. After exercising. General instructions Do not let others use towels, shoes, nail clippers, or other personal items that touch your feet. Protect your feet by wearing sandals in wet areas, such as locker rooms and shared showers. Keep all follow-up visits. This is important. If you have diabetes, keep your blood sugar under control. Contact a health care provider if: You have a fever. You have swelling, soreness, warmth, or redness in your foot. Your feet are not getting better with treatment. Your symptoms get worse. You have new symptoms. You have severe pain. Summary Athlete's foot (tinea pedis) is a fungal infection of the skin on your feet. It often occurs on skin that is between or underneath the toes. This condition is caused by a fungus that grows in warm, moist places. Symptoms include white, flaky, or scaly areas between your toes or on the soles of your feet. This condition is treated with antifungal medicines.  Keep your feet clean. Always dry them thoroughly. This information is not intended to replace advice given to you by your health care provider. Make sure you discuss any questions you have with your health care provider. Document Revised: 04/29/2020 Document Reviewed: 04/29/2020 Elsevier Patient Education  2024 [employer]   Diabetes Mellitus and Foot Care Diabetes,  also called diabetes mellitus, may cause problems with your feet and legs because of poor blood flow (circulation). Poor circulation may make your skin: Become thinner and drier. Break more easily. Heal more slowly. Peel and crack. You may also have nerve damage (neuropathy). This can cause decreased feeling in your legs and feet. This means that you may not notice minor injuries to your feet that could lead to more serious problems. Finding and treating problems early is the best way to prevent future foot problems. How to care for your feet Foot hygiene  Wash your feet daily with warm water and mild soap. Do not use hot water. Then, pat your feet and the areas between your toes until they are fully dry. Do not soak your feet. This can dry your skin. Trim your toenails straight across. Do not dig under them or around the cuticle. File the edges of your nails with an emery board or nail file. Apply a moisturizing lotion or petroleum jelly to the skin on your feet and to dry, brittle toenails. Use lotion that does not contain alcohol and is unscented. Do not apply lotion between your toes. Shoes and socks Wear clean socks or stockings every day. Make sure they are not too tight. Do not wear knee-high stockings. These may decrease blood flow to your legs. Wear shoes that fit well and have enough cushioning. Always look in your shoes before you put them on to be sure there are no objects inside. To break in new shoes, wear them for just a few hours a day. This prevents injuries on your feet. Wounds, scrapes, corns, and calluses  Check your feet daily for blisters, cuts, bruises, sores, and redness. If you cannot see the bottom of your feet, use a mirror or ask someone for help. Do not cut off corns or calluses or try to remove them with medicine. If you find a minor scrape, cut, or break in the skin on your feet, keep it and the skin around it clean and dry. You may clean these areas with mild soap  and water. Do not clean the area with peroxide, alcohol, or iodine. If you have a wound, scrape, corn, or callus on your foot, look at it several times a day to make sure it is healing and not infected. Check for: Redness, swelling, or pain. Fluid or blood. Warmth. Pus or a bad smell. General tips Do not cross your legs. This may decrease blood flow to your feet. Do not use heating pads or hot water bottles on your feet. They may burn your skin. If you have lost feeling in your feet or legs, you may not know this is happening until it is too late. Protect your feet from hot and cold by wearing shoes, such as at the beach or on hot pavement. Schedule a complete foot exam at least once a year or more often if you have foot problems. Report any cuts, sores, or bruises to your health care provider right away. Where to find more information American Diabetes Association: diabetes.org Association of Diabetes Care & Education Specialists: diabeteseducator.org Contact a health care provider if: You have  a condition that increases your risk of infection, and you have any cuts, sores, or bruises on your feet. You have an injury that is not healing. You have redness on your legs or feet. You feel burning or tingling in your legs or feet. You have pain or cramps in your legs and feet. Your legs or feet are numb. Your feet always feel cold. You have pain around any toenails. Get help right away if: You have a wound, scrape, corn, or callus on your foot and: You have signs of infection. You have a fever. You have a red line going up your leg. This information is not intended to replace advice given to you by your health care provider. Make sure you discuss any questions you have with your health care provider. Document Revised: 07/10/2021 Document Reviewed: 07/10/2021 Elsevier Patient Education  2024 [employer].

## 2023-03-26 NOTE — Progress Notes (Signed)
 Subjective: Cynthia Castillo presents today for diabetic foot evaluation. She is accompanied by her daughter on today's visit. Patient states it has been a long time since she's had toenail care. Last recorded Podiatry visit in Epic was May of 2019. Daughter states Mom did not complain about her feet. Severely elongated mycotic toenails were evaluated by PCP and referral was made to our clinic for management. Chief Complaint  Patient presents with   Nail Problem    Pt is here for [facility] unsure of last A1C PCP is Dr Sharol Harness and LOV was in January.    PCP is Simmons-Robinson, Tawanna Cooler, MD.  Past Medical History:  Diagnosis Date   Arthritis    hands   Complication of anesthesia    Diabetes mellitus without complication (HCC) 2015   diet control   Hypertension    Murmur    PONV (postoperative nausea and vomiting)     Patient Active Problem List   Diagnosis Date Noted   Poor dentition 06/25/2022   Hyperpigmented skin lesion 06/25/2022   Dermatitis 05/24/2022   Impacted cerumen of left ear 02/17/2022   Bilateral hand pain 02/17/2022   Healthcare maintenance 10/25/2021   Primary osteoarthritis of left knee 06/19/2015   Arthritis 01/01/2015   Allergic rhinitis 08/15/2014   Arthritis of knee 08/15/2014   Endometriosis 08/15/2014   HLD (hyperlipidemia) 08/15/2014   Adult hypothyroidism 08/15/2014   Adiposity 08/15/2014   Primary osteoarthritis of knee 07/25/2014   Diabetes mellitus, type 2 (HCC) 12/27/2007   Avitaminosis D 12/27/2007   Deficiency, disaccharidase intestinal 09/18/2005   Allergic reaction 12/09/1994   Bloodgood disease 10/19/1990   Essential (primary) hypertension 10/19/1990    Past Surgical History:  Procedure Laterality Date   ABDOMINAL HYSTERECTOMY  1972   APPENDECTOMY     BREAST CYST EXCISION Left 1972   COLONOSCOPY  2007   Dr Lemar Livings   COLONOSCOPY WITH PROPOFOL N/A 03/21/2015   Procedure: COLONOSCOPY WITH PROPOFOL;  Surgeon: Earline Mayotte, MD;   Location[facility] ENDOSCOPY;  Service: Endoscopy;  Laterality: N/A;   REPLACEMENT TOTAL KNEE BILATERAL     TOTAL KNEE ARTHROPLASTY Right 07/25/2014   Procedure: TOTAL KNEE ARTHROPLASTY;  Surgeon: Kennedy Bucker, MD;  Location: [facility] ORS;  Service: Orthopedics;  Laterality: Right;   TOTAL KNEE ARTHROPLASTY Left 06/19/2015   Procedure: TOTAL KNEE ARTHROPLASTY;  Surgeon: Kennedy Bucker, MD;  Location: [facility] ORS;  Service: Orthopedics;  Laterality: Left;    Current Outpatient Medications on File Prior to Visit  Medication Sig Dispense Refill   amLODipine (NORVASC) 5 MG tablet Take 1 tablet (5 mg total) by mouth daily. 90 tablet 2   losartan-hydrochlorothiazide (HYZAAR) 100-25 MG tablet Take 1 tablet by mouth daily. 90 tablet 1   Multiple Vitamins-Minerals (CENTRUM SILVER 50+WOMEN PO) Take by mouth daily.      No current facility-administered medications on file prior to visit.     Allergies  Allergen Reactions   Latex Anaphylaxis    Social History   Occupational History   Occupation: retired  Tobacco Use   Smoking status: Never   Smokeless tobacco: Never  Vaping Use   Vaping status: Never Used  Substance and Sexual Activity   Alcohol use: No    Alcohol/week: 0.0 standard drinks of alcohol   Drug use: No   Sexual activity: Never    Family History  Problem Relation Age of Onset   Stroke Mother    Heart attack Father    Cancer Sister 80  stomach/Shirley   Cancer Sister 23       breast/Rosa Graves   Breast cancer Sister    Cancer Maternal Grandmother        unknown    Immunization History  Administered Date(s) Administered   Fluad Quad(high Dose 65+) 01/03/2019, 02/17/2022   Influenza, High Dose Seasonal PF 10/23/2014, 09/17/2015, 12/23/2016, 12/30/2017   Pneumococcal Conjugate-13 08/16/2013   Pneumococcal Polysaccharide-23 11/20/2010   Td 08/25/2003   Tdap 10/20/2012   Zoster, Live 10/20/2012    Objective: There were no vitals filed for this visit.  Cynthia Castillo  is a pleasant 83 y.o. female WD, WN in NAD. AAO X 3.   Title   Diabetic Foot Exam - detailed Date & Time: 03/23/2023  3:00 PM Diabetic Foot exam was performed with the following findings: Yes  Visual Foot Exam completed.: Yes  Is there a history of foot ulcer?: No Is there a foot ulcer now?: No Is there swelling?: Yes Is there elevated skin temperature?: No Is there abnormal foot shape?: Yes Is there a claw toe deformity?: No Are the toenails long?: Yes (Comment: Onychogryphosis of toenails 1-5 b/l) Are the toenails thick?: Yes Are the toenails ingrown?: No Is the skin thin, fragile, shiny and hairless?": No Normal Range of Motion?: Yes Is there foot or ankle muscle weakness?: No Do you have pain in calf while walking?: No Are the shoes appropriate in style and fit?: Yes Can the patient see the bottom of their feet?: Yes Pulse Foot Exam completed.: Yes   Right Posterior Tibialis: Present Left posterior Tibialis: Present   Right Dorsalis Pedis: Present Left Dorsalis Pedis: Present     Sensory Foot Exam Completed.: Yes Semmes-Weinstein Monofilament Test "+" means "has sensation" and "-" means "no sensation"  R Foot Test Control: Pos L Foot Test Control: Pos   R Site 1-Great Toe: Pos L Site 1-Great Toe: Pos   R Site 4: Pos L Site 4: Pos   R site 5: Pos L Site 5: Pos  R Site 6: Pos L Site 6: Pos     Image components are not supported.   Image components are not supported. Image components are not supported.  Tuning Fork Right vibratory: present Left vibratory: present  Comments Thickened plaques of yellow skin noted dorsally and interdigitally from poor pedal hygiene. No underlying open wounds or interdigital macerations, but she does have scaling consistent with tinea pedis.      Lab Results  Component Value Date   HGBA1C 5.8 (H) 02/17/2023   Assessment: 1. Nail disorder (onychogryphosis)   2. Tinea pedis of both feet   3. Pain in both feet   4. Type 2 diabetes  mellitus with stage 1 chronic kidney disease, without long-term current use of insulin (HCC)   5. Hallux valgus, acquired, bilateral   6. Encounter for diabetic foot exam (HCC)     ADA Risk Categorization: Low Risk:  Patient has all of the following: Intact protective sensation No prior foot ulcer  No severe deformity Pedal pulses present  Plan: Meds ordered this encounter  Medications   ketoconazole (NIZORAL) 2 % cream    Sig: Apply to both feet and between toes once daily for 6 weeks.    Dispense:  60 g    Refill:  1   -Diabetic foot examination performed today. -Discussed diabetic foot care principles. Literature dispensed on today. -Continue diabetic foot care principles: inspect feet daily, monitor glucose as recommended by PCP and/or Endocrinologist, and follow  prescribed diet per PCP, Endocrinologist and/or dietician. -Patient to continue soft, supportive shoe gear daily. -Softened plaques bilateral feet and they were gently removed from both feet. Both feet cleansed with Puracyn Wound Cleanser followed by alcohol.  -Mycotic toenails 1-5 bilaterally were debrided in length and girth with sterile nail nippers and dremel without incident. Patient noted relief of pain symptoms post treatment today. They understand she will be placed on 3 month intervals. -Discussed tinea pedis infection. To prevent re-infection of tinea pedis, patient/POA/caregiver instructed to spray shoes with Lysol every evening and clean tub/shower with bleach based cleanser. -Rx refilled for Ketoconazole Cream 2% to be applied once daily for six weeks. -Patient/POA to call should there be question/concern in the interim.  Return in about 3 months (around 06/23/2023).  Freddie Breech, DPM      [facility] LOCATION: [address] [place] [address]                   Office [phone]   [facility] LOCATION: [address] [place], [place]  [address] Office [phone]

## 2023-05-12 DIAGNOSIS — G35 Multiple sclerosis: Secondary | ICD-10-CM | POA: Diagnosis not present

## 2023-05-28 ENCOUNTER — Ambulatory Visit: Payer: Self-pay | Admitting: *Deleted

## 2023-05-28 DIAGNOSIS — Z008 Encounter for other general examination: Secondary | ICD-10-CM | POA: Diagnosis not present

## 2023-05-28 DIAGNOSIS — Z9181 History of falling: Secondary | ICD-10-CM | POA: Diagnosis not present

## 2023-05-28 DIAGNOSIS — E785 Hyperlipidemia, unspecified: Secondary | ICD-10-CM | POA: Diagnosis not present

## 2023-05-28 DIAGNOSIS — Z809 Family history of malignant neoplasm, unspecified: Secondary | ICD-10-CM | POA: Diagnosis not present

## 2023-05-28 DIAGNOSIS — I129 Hypertensive chronic kidney disease with stage 1 through stage 4 chronic kidney disease, or unspecified chronic kidney disease: Secondary | ICD-10-CM | POA: Diagnosis not present

## 2023-05-28 DIAGNOSIS — E039 Hypothyroidism, unspecified: Secondary | ICD-10-CM | POA: Diagnosis not present

## 2023-05-28 DIAGNOSIS — E669 Obesity, unspecified: Secondary | ICD-10-CM | POA: Diagnosis not present

## 2023-05-28 DIAGNOSIS — Z8249 Family history of ischemic heart disease and other diseases of the circulatory system: Secondary | ICD-10-CM | POA: Diagnosis not present

## 2023-05-28 DIAGNOSIS — Z87891 Personal history of nicotine dependence: Secondary | ICD-10-CM | POA: Diagnosis not present

## 2023-05-28 DIAGNOSIS — N1832 Chronic kidney disease, stage 3b: Secondary | ICD-10-CM | POA: Diagnosis not present

## 2023-05-28 DIAGNOSIS — J309 Allergic rhinitis, unspecified: Secondary | ICD-10-CM | POA: Diagnosis not present

## 2023-05-28 DIAGNOSIS — I739 Peripheral vascular disease, unspecified: Secondary | ICD-10-CM | POA: Diagnosis not present

## 2023-05-28 DIAGNOSIS — M1990 Unspecified osteoarthritis, unspecified site: Secondary | ICD-10-CM | POA: Diagnosis not present

## 2023-05-28 LAB — LAB REPORT - SCANNED
EGFR: 59
Microalb Creat Ratio: 30

## 2023-05-28 NOTE — Telephone Encounter (Signed)
 Copied from CRM [id number]. Topic: Clinical - Red Word Triage >> May 28, 2023  9:27 AM Cynthia Castillo wrote: [facility] that prompted transfer to Nurse Triage: high blood pressure Reason for Disposition  Systolic BP  >= 160 OR Diastolic >= 100  Answer Assessment - Initial Assessment Questions 1. BLOOD PRESSURE: "What is the blood pressure?" "Did you take at least two measurements 5 minutes apart?"     Cynthia League, Cynthia Castillo doing home visit.     It appears she is not taking her medicine.   She does not take the medicine.   She has the medicine.   BP today 116/105, 216/100 and 222/107.    I was calling so she can be seen.   Rx not been seen sine July 2024.  Also needs home health order.    2. ONSET: "When did you take your blood pressure?"     This morning 3. HOW: "How did you take your blood pressure?" (e.g., automatic home BP monitor, visiting nurse)     Cynthia Castillo on home visit calling it in. 4. HISTORY: "Do you have a history of high blood pressure?"     Yes 5. MEDICINES: "Are you taking any medicines for blood pressure?" "Have you missed any doses recently?"     Pt not taking her medicine 6. OTHER SYMPTOMS: "Do you have any symptoms?" (e.g., blurred vision, chest pain, difficulty breathing, headache, weakness)     Needing home health services.    No symptoms due to BP 7. PREGNANCY: "Is there any chance you are pregnant?" "When was your last menstrual period?"     N/A due to age  Protocols used: Blood Pressure - High-A-AH  Chief Complaint: Cynthia League, Cynthia Castillo with home health there with pt.   Pt. Is not taking her BP medicines except once or twice a week.  BP is elevated.   Cynthia Castillo is calling in to get her an appt.   I scheduled her for Jun 03, 2023 at 8:20 with Cynthia Castillo.    Symptoms: No symptoms per Cynthia League, Cynthia Castillo but is concerned that pt is not taking her medication.   Requesting a home health nurse come out once or twice a week to check on her.   Cynthia League, Cynthia Castillo went over signs and symptoms to go to the urgent care close by her  house if they occur with pt while on the line with me.   She also emphasized the importance to pt of taking her BP medication daily.   Per the pharmacy she has not picked up a refill of her BP medication since August 2024.      LOV with Cynthia Castillo was 02/17/2023 per chart.    Frequency: Daily, not taking her medication Pertinent Negatives: Patient denies having questions before ending my call with Cynthia League, Cynthia Castillo.    Disposition: [] ED /[] Urgent Care (no appt availability in office) / [x] Appointment(In office/virtual)/ []  [facility]/ [] Home Care/ [] Refused Recommended Disposition /[] [facility]/ []  Follow-up with PCP Additional Notes: Appt made as listed above.   Cynthia League, Cynthia Castillo had pt write appt on her calendar while I was still on the phone with her so she would not forget.

## 2023-05-29 ENCOUNTER — Telehealth: Payer: Self-pay

## 2023-05-29 DIAGNOSIS — I739 Peripheral vascular disease, unspecified: Secondary | ICD-10-CM | POA: Insufficient documentation

## 2023-05-29 NOTE — Assessment & Plan Note (Signed)
 Newly diagnosed  Right side 1.0 left side 0.89.

## 2023-05-29 NOTE — Telephone Encounter (Signed)
 Copied from CRM [id number]. Topic: Clinical - Lab/Test Results >> May 29, 2023  9:45 AM Everlene Hobby D wrote: Cynthia Castillo with [facility]- May 28, 2023 during visit she was given Peripheral artery disease test result was abnormal right side 1.0 left side 0.89. reaching out to let care team now and the information will be mailed to the office in 7-14 business days. They would like for the patient to make a f/u appointment. Call back [phone] on secure line 8am-5pm cst.

## 2023-05-29 NOTE — Progress Notes (Unsigned)
      Established patient visit   Patient: Cynthia Castillo   DOB: 02-03-1940   83 y.o. Female  MRN: [id number] Visit Date: 06/03/2023  Today's healthcare provider: Mimi Alt, MD   No chief complaint on file.  Subjective       Discussed the use of AI scribe software for clinical note transcription with the patient, who gave verbal consent to proceed.  History of Present Illness      Past Medical History:  Diagnosis Date   Arthritis    hands   Complication of anesthesia    Diabetes mellitus without complication (HCC) 2015   diet control   Hypertension    Murmur    PONV (postoperative nausea and vomiting)     Medications: Outpatient Medications Prior to Visit  Medication Sig   amLODipine  (NORVASC ) 5 MG tablet Take 1 tablet (5 mg total) by mouth daily.   ketoconazole  (NIZORAL ) 2 % cream Apply to both feet and between toes once daily for 6 weeks.   losartan -hydrochlorothiazide  (HYZAAR) 100-25 MG tablet Take 1 tablet by mouth daily.   Multiple Vitamins-Minerals (CENTRUM SILVER 50+WOMEN PO) Take by mouth daily.    No facility-administered medications prior to visit.    Review of Systems  {Insert previous labs (optional):23779} {See past labs  Heme  Chem  Endocrine  Serology  Results Review (optional):1}   Objective    There were no vitals taken for this visit. {Insert last BP/Wt (optional):23777}{See vitals history (optional):1}    Physical Exam  ***  No results found for any visits on 06/03/23.  Assessment & Plan     Problem List Items Addressed This Visit   None   Assessment and Plan Assessment & Plan      No follow-ups on file.         Mimi Alt, MD  [facility] [phone] (phone) [phone] (fax)  [facility]

## 2023-05-29 NOTE — Telephone Encounter (Signed)
 Reviewed, will discuss at visit scheduled for 06/03/23

## 2023-06-03 ENCOUNTER — Ambulatory Visit (INDEPENDENT_AMBULATORY_CARE_PROVIDER_SITE_OTHER): Admitting: Family Medicine

## 2023-06-03 ENCOUNTER — Other Ambulatory Visit: Payer: Self-pay

## 2023-06-03 ENCOUNTER — Encounter: Payer: Self-pay | Admitting: Family Medicine

## 2023-06-03 ENCOUNTER — Emergency Department
Admission: EM | Admit: 2023-06-03 | Discharge: 2023-06-03 | Disposition: A | Discharge status: Home / Self Care | Attending: Emergency Medicine | Admitting: Emergency Medicine

## 2023-06-03 VITALS — BP 248/78 | HR 77 | Ht 61.0 in | Wt 157.0 lb

## 2023-06-03 DIAGNOSIS — I10 Essential (primary) hypertension: Secondary | ICD-10-CM | POA: Diagnosis not present

## 2023-06-03 DIAGNOSIS — N181 Chronic kidney disease, stage 1: Secondary | ICD-10-CM

## 2023-06-03 DIAGNOSIS — E1122 Type 2 diabetes mellitus with diabetic chronic kidney disease: Secondary | ICD-10-CM | POA: Diagnosis not present

## 2023-06-03 DIAGNOSIS — R69 Illness, unspecified: Secondary | ICD-10-CM | POA: Diagnosis not present

## 2023-06-03 DIAGNOSIS — I739 Peripheral vascular disease, unspecified: Secondary | ICD-10-CM | POA: Diagnosis not present

## 2023-06-03 DIAGNOSIS — E039 Hypothyroidism, unspecified: Secondary | ICD-10-CM | POA: Diagnosis not present

## 2023-06-03 LAB — COMPREHENSIVE METABOLIC PANEL WITH GFR
ALT: 10 U/L (ref 0–44)
AST: 17 U/L (ref 15–41)
Albumin: 3.8 g/dL (ref 3.5–5.0)
Alkaline Phosphatase: 64 U/L (ref 38–126)
Anion gap: 8 (ref 5–15)
BUN: 15 mg/dL (ref 8–23)
CO2: 22 mmol/L (ref 22–32)
Calcium: 9.2 mg/dL (ref 8.9–10.3)
Chloride: 109 mmol/L (ref 98–111)
Creatinine, Ser: 1.21 mg/dL — ABNORMAL HIGH (ref 0.44–1.00)
GFR, Estimated: 45 mL/min — ABNORMAL LOW (ref >60)
Glucose, Bld: 124 mg/dL — ABNORMAL HIGH (ref 70–99)
Potassium: 4.1 mmol/L (ref 3.5–5.1)
Sodium: 139 mmol/L (ref 135–145)
Total Bilirubin: 0.7 mg/dL (ref 0.0–1.2)
Total Protein: 7.8 g/dL (ref 6.5–8.1)

## 2023-06-03 LAB — CBC WITH DIFFERENTIAL/PLATELET
Abs Immature Granulocytes: 0.02 10*3/uL (ref 0.00–0.07)
Basophils Absolute: 0 10*3/uL (ref 0.0–0.1)
Basophils Relative: 1 %
Eosinophils Absolute: 0.1 10*3/uL (ref 0.0–0.5)
Eosinophils Relative: 3 %
HCT: 44.3 % (ref 36.0–46.0)
Hemoglobin: 14 g/dL (ref 12.0–15.0)
Immature Granulocytes: 0 %
Lymphocytes Relative: 26 %
Lymphs Abs: 1.3 10*3/uL (ref 0.7–4.0)
MCH: 26.6 pg (ref 26.0–34.0)
MCHC: 31.6 g/dL (ref 30.0–36.0)
MCV: 84.1 fL (ref 80.0–100.0)
Monocytes Absolute: 0.4 10*3/uL (ref 0.1–1.0)
Monocytes Relative: 9 %
Neutro Abs: 3 10*3/uL (ref 1.7–7.7)
Neutrophils Relative %: 61 %
Platelets: 345 10*3/uL (ref 150–400)
RBC: 5.27 MIL/uL — ABNORMAL HIGH (ref 3.87–5.11)
RDW: 13.6 % (ref 11.5–15.5)
WBC: 4.9 10*3/uL (ref 4.0–10.5)
nRBC: 0 % (ref 0.0–0.2)

## 2023-06-03 LAB — TROPONIN I (HIGH SENSITIVITY): Troponin I (High Sensitivity): 6 ng/L (ref <18)

## 2023-06-03 MED ORDER — AMLODIPINE BESYLATE 5 MG PO TABS
5.0000 mg | ORAL_TABLET | Freq: Once | ORAL | Status: AC
  Administered 2023-06-03: 5 mg via ORAL
  Filled 2023-06-03: qty 1

## 2023-06-03 MED ORDER — HYDROCHLOROTHIAZIDE 25 MG PO TABS
25.0000 mg | ORAL_TABLET | Freq: Once | ORAL | Status: AC
  Administered 2023-06-03: 25 mg via ORAL
  Filled 2023-06-03: qty 1

## 2023-06-03 MED ORDER — LOSARTAN POTASSIUM-HCTZ 100-25 MG PO TABS: 1.0000 | ORAL_TABLET | Freq: Every day | ORAL | 2 refills | Status: DC

## 2023-06-03 MED ORDER — AMLODIPINE BESYLATE 5 MG PO TABS: 5.0000 mg | ORAL_TABLET | Freq: Every day | ORAL | 2 refills | Status: DC

## 2023-06-03 MED ORDER — LOSARTAN POTASSIUM 50 MG PO TABS
100.0000 mg | ORAL_TABLET | Freq: Once | ORAL | Status: AC
  Administered 2023-06-03: 100 mg via ORAL
  Filled 2023-06-03: qty 2

## 2023-06-03 NOTE — ED Provider Notes (Signed)
 [facility] Provider Note   Event Date/Time   First MD Initiated Contact with Patient 06/03/23 [phone]     (approximate) History  Hypertension  HPI Cynthia Castillo is a 83 y.o. female with a past medical history of hypertension who presents from her primary care physician's office after being found to have high blood pressure with systolic in the 240s.  Patient arrives via EMS with systolic blood pressures in the 170s.  Patient denies any recent medication changes. ROS: Patient currently denies any vision changes, tinnitus, difficulty speaking, facial droop, sore throat, chest pain, shortness of breath, abdominal pain, nausea/vomiting/diarrhea, dysuria, or weakness/numbness/paresthesias in any extremity   Physical Exam  Triage Vital Signs: ED Triage Vitals  Encounter Vitals Group     BP 06/03/23 0942 (!) 179/106     Systolic BP Percentile --      Diastolic BP Percentile --      Pulse Rate 06/03/23 0942 75     Resp --      Temp 06/03/23 0942 98.6 F (37 C)     Temp src --      SpO2 06/03/23 0942 100 %     Weight 06/03/23 0940 159 lb (72.1 kg)     Height 06/03/23 0940 5\' 1"  (1.549 m)     Head Circumference --      Peak Flow --      Pain Score 06/03/23 0940 0     Pain Loc --      Pain Education --      Exclude from Growth Chart --    Most recent vital signs: Vitals:   06/03/23 0942  BP: (!) 179/106  Pulse: 75  Temp: 98.6 F (37 C)  SpO2: 100%   General: Awake, oriented x4.  Poor dentition CV:  Good peripheral perfusion.  Resp:  Normal effort.  Abd:  No distention.  Other:  Elderly overweight African-American female resting comfortably in no acute distress ED Results / Procedures / Treatments  Labs (all labs ordered are listed, but only abnormal results are displayed) Labs Reviewed  COMPREHENSIVE METABOLIC PANEL WITH GFR - Abnormal; Notable for the following components:      Result Value   Glucose, Bld 124 (*)    Creatinine, Ser 1.21 (*)     GFR, Estimated 45 (*)    All other components within normal limits  CBC WITH DIFFERENTIAL/PLATELET - Abnormal; Notable for the following components:   RBC 5.27 (*)    All other components within normal limits  TROPONIN I (HIGH SENSITIVITY)  TROPONIN I (HIGH SENSITIVITY)   EKG ED ECG REPORT I, Charleen Conn, the attending physician, personally viewed and interpreted this ECG. Date: 06/03/2023 EKG Time: 0945 Rate: 71 Rhythm: normal sinus rhythm QRS Axis: normal Intervals: normal ST/T Wave abnormalities: normal Narrative Interpretation: no evidence of acute ischemia PROCEDURES: Critical Care performed: No .1-3 Lead EKG Interpretation  Performed by: Charleen Conn, MD Authorized by: Charleen Conn, MD     Interpretation: normal     ECG rate:  71   ECG rate assessment: normal     Rhythm: sinus rhythm     Ectopy: none     Conduction: normal    MEDICATIONS ORDERED IN ED: Medications  amLODipine  (NORVASC ) tablet 5 mg (5 mg Oral Given 06/03/23 1004)  losartan  (COZAAR ) tablet 100 mg (100 mg Oral Given 06/03/23 1004)  hydrochlorothiazide  (HYDRODIURIL ) tablet 25 mg (25 mg Oral Given 06/03/23 1004)   IMPRESSION / MDM /  ASSESSMENT AND PLAN / ED COURSE  I reviewed the triage vital signs and the nursing notes.                             The patient is on the cardiac monitor to evaluate for evidence of arrhythmia and/or significant heart rate changes. Patient's presentation is most consistent with acute presentation with potential threat to life or bodily function. Presents to the emergency department complaining of high blood pressure. Patient is otherwise asymptomatic without confusion, chest pain, hematuria, or SOB. Endorses nonadherence to antihypertensive regimen DDx: CV, AMI, heart failure, renal infarction or failure or other end organ damage.  Disposition: Discussed with patient their elevated blood pressure and need for close outpatient management of their hypertension. Will  provide a prescription for the patient's previous antihypertensive medication and arrange for the patient to follow up in a primary care clinic    FINAL CLINICAL IMPRESSION(S) / ED DIAGNOSES   Final diagnoses:  Uncontrolled hypertension   Rx / DC Orders   ED Discharge Orders     None      Note:  This document was prepared using Dragon voice recognition software and may include unintentional dictation errors.   Amanda Steuart K, MD 06/03/23 [phone]

## 2023-06-03 NOTE — ED Triage Notes (Signed)
 Pt presents to ED from PCP for evaluation of elevated blood pressure 248/78.  EMS 196/72

## 2023-06-03 NOTE — Discharge Instructions (Addendum)
 Please follow-up with your primary care physician for further management of your blood pressure medicines

## 2023-06-03 NOTE — Assessment & Plan Note (Signed)
 Severe Hypertension Severely elevated blood pressure at 248/78 mmHg, confirmed at 244/86 mmHg. Reports intermittent headaches and right-sided chest discomfort, likely related to elevated blood pressure. High risk of stroke or myocardial infarction due to uncontrolled hypertension. High sodium intake from fried foods may contribute to elevated blood pressure. The degree of hypertension is life-threatening, necessitating urgent evaluation. - Refill losartan  100 mg, hydrochlorothiazide  25 mg, and amlodipine  5 mg daily. - Arrange transportation to the emergency department for urgent evaluation and management of severe hypertension. - Order home health services for medication management assistance. - Educate on medication adherence and dietary modifications to reduce sodium intake.

## 2023-06-10 ENCOUNTER — Encounter: Payer: Self-pay | Admitting: Family Medicine

## 2023-06-10 ENCOUNTER — Ambulatory Visit: Admitting: Family Medicine

## 2023-06-10 VITALS — BP 155/76 | HR 72 | Ht 61.0 in | Wt 155.0 lb

## 2023-06-10 DIAGNOSIS — R413 Other amnesia: Secondary | ICD-10-CM

## 2023-06-10 DIAGNOSIS — I10 Essential (primary) hypertension: Secondary | ICD-10-CM

## 2023-06-10 DIAGNOSIS — N181 Chronic kidney disease, stage 1: Secondary | ICD-10-CM

## 2023-06-10 DIAGNOSIS — E1122 Type 2 diabetes mellitus with diabetic chronic kidney disease: Secondary | ICD-10-CM | POA: Diagnosis not present

## 2023-06-10 NOTE — Assessment & Plan Note (Addendum)
 Memory loss, progressing  Memory concerns with potential vascular dementia due to hypertension. Symptoms include forgetting appointments and difficulty keeping track of days. Discussed the importance of social interaction and mental stimulation to slow cognitive decline. Consideration of vascular changes as a contributing factor to memory loss. Isolation and lack of social interaction may contribute to cognitive decline. - Refer to value-based care institute for memory resources and support. - Encourage participation in social activities and mental exercises to stimulate cognitive function. - would like to complete MOCA at next OV

## 2023-06-10 NOTE — Assessment & Plan Note (Signed)
 Hypertension, chronic, improved Severely elevated blood pressure due to non-adherence to medication regimen. Current medications include amlodipine  5 mg daily, hydrochlorothiazide  25 mg daily, and losartan  100 mg daily. Blood pressure goal is under 140/90 mmHg. Current reading is 155/76 mmHg, indicating the need for better adherence to medication. Discussed the importance of controlling blood pressure to prevent cardiovascular complications. Explored options to improve medication adherence, including pill packs and home health support. - Coordinate with pharmacist to provide pill packs for medication adherence. - Initiate home health services to assist with medication administration and monitoring. - Order urine albumin test to assess for hypertension-related complications.

## 2023-06-10 NOTE — Assessment & Plan Note (Signed)
 Chronic  Well controlled   Glucose level of 124 mg/dL in the ED, consistent with prediabetes. Hemoglobin A1c was 5.8% in January, indicating good control. Discussed dietary modifications to prevent progression to diabetes, including reducing intake of fried foods and opting for healthier alternatives like grilled chicken and fruit cups. - Encourage dietary modifications to maintain glucose control and prevent progression to diabetes.

## 2023-06-10 NOTE — Progress Notes (Signed)
 Established patient visit   Patient: Cynthia Castillo   DOB: April 22, 1940   83 y.o. Female  MRN: [id number] Visit Date: 06/10/2023  Today's healthcare provider: Mimi Alt, MD   Chief Complaint  Patient presents with   Hypertension    Didn't take her  medication (daughter is with her this morning)   Subjective     HPI     Hypertension    Additional comments: Didn't take her  medication (daughter is with her this morning)      Last edited by Bart Lieu, CMA on 06/10/2023 10:08 AM.       Discussed the use of AI scribe software for clinical note transcription with the patient, who gave verbal consent to proceed.  History of Present Illness Cynthia Castillo is an 83 year old female with hypertension who presents with severely elevated blood pressure due to non-adherence to medication regimen.  She was seen last week for severely elevated blood pressure and has not been adhering to her prescribed medication regimen, which includes amlodipine  5 mg daily, hydrochlorothiazide  25 mg daily, and losartan  100 mg daily. She has not taken her medication today.  In the emergency department, her glucose level was 124 mg/dL and creatinine was 1.2 mg/dL, consistent with her baseline. Other lab values were within normal range. An EKG performed last week was normal.  She experiences memory issues, such as forgetting regular appointments and having difficulty keeping track of days. Her daughter, Cynthia Castillo, has observed these memory lapses and is concerned. She lives alone in [place] and has been managing her daily activities independently, including driving, although she has not been frequenting grocery stores much.  Her social interactions are limited, and she prefers to stay at home watching TV. She has adjusted to being alone over the years. Her daughter lives three hours away, which limits frequent in-person support.  Her diabetes is well-controlled, with a recent HbA1c  of 5.8%, placing her in the prediabetes range. She wants to adjust her diet, particularly reducing fried foods.     Past Medical History:  Diagnosis Date   Arthritis    hands   Complication of anesthesia    Diabetes mellitus without complication (HCC) 2015   diet control   Hypertension    Murmur    PONV (postoperative nausea and vomiting)     Medications: Outpatient Medications Prior to Visit  Medication Sig   amLODipine  (NORVASC ) 5 MG tablet Take 1 tablet (5 mg total) by mouth daily.   ketoconazole  (NIZORAL ) 2 % cream Apply to both feet and between toes once daily for 6 weeks.   losartan -hydrochlorothiazide  (HYZAAR) 100-25 MG tablet Take 1 tablet by mouth daily.   Multiple Vitamins-Minerals (CENTRUM SILVER 50+WOMEN PO) Take by mouth daily.    No facility-administered medications prior to visit.    Review of Systems  Last metabolic panel Lab Results  Component Value Date   GLUCOSE 124 (H) 06/03/2023   NA 139 06/03/2023   K 4.1 06/03/2023   CL 109 06/03/2023   CO2 22 06/03/2023   BUN 15 06/03/2023   CREATININE 1.21 (H) 06/03/2023   GFRNONAA 45 (L) 06/03/2023   CALCIUM  9.2 06/03/2023   PROT 7.8 06/03/2023   ALBUMIN 3.8 06/03/2023   LABGLOB 3.3 02/11/2022   AGRATIO 1.3 02/11/2022   BILITOT 0.7 06/03/2023   ALKPHOS 64 06/03/2023   AST 17 06/03/2023   ALT 10 06/03/2023   ANIONGAP 8 06/03/2023   Last lipids Lab Results  Component Value Date   CHOL 198 09/13/2020   HDL 60 09/13/2020   LDLCALC 120 (H) 09/13/2020   TRIG 103 09/13/2020   CHOLHDL 3.3 09/13/2020   The ASCVD Risk score (Arnett DK, et al., 2019) failed to calculate for the following reasons:   The 2019 ASCVD risk score is only valid for ages 71 to 48  Last hemoglobin A1c Lab Results  Component Value Date   HGBA1C 5.8 (H) 02/17/2023   Last thyroid  functions Lab Results  Component Value Date   TSH 1.930 02/17/2023   Last vitamin D No results found for: "25OHVITD2", "25OHVITD3",  "VD25OH" Last vitamin B12 and Folate No results found for: "VITAMINB12", "FOLATE"      Objective    BP (!) 155/76   Pulse 72   Ht 5\' 1"  (1.549 m)   Wt 155 lb (70.3 kg)   SpO2 100%   BMI 29.29 kg/m  BP Readings from Last 3 Encounters:  06/10/23 (!) 155/76  06/03/23 (!) 203/166  06/03/23 (!) 248/78   Wt Readings from Last 3 Encounters:  06/10/23 155 lb (70.3 kg)  06/03/23 159 lb (72.1 kg)  06/03/23 157 lb (71.2 kg)        Physical Exam  General: Alert, no acute distress Cardio: Normal S1 and S2, RRR, no r/m/g Pulm: CTAB, normal work of breathing Extremities: no LE edema  Neuro: pt is alert, at baseline, oriented to self, time and place, has some difficulty with memory    No results found for any visits on 06/10/23.  Assessment & Plan     Problem List Items Addressed This Visit       Cardiovascular and Mediastinum   Essential (primary) hypertension - Primary   Hypertension, chronic, improved Severely elevated blood pressure due to non-adherence to medication regimen. Current medications include amlodipine  5 mg daily, hydrochlorothiazide  25 mg daily, and losartan  100 mg daily. Blood pressure goal is under 140/90 mmHg. Current reading is 155/76 mmHg, indicating the need for better adherence to medication. Discussed the importance of controlling blood pressure to prevent cardiovascular complications. Explored options to improve medication adherence, including pill packs and home health support. - Coordinate with pharmacist to provide pill packs for medication adherence. - Initiate home health services to assist with medication administration and monitoring. - Order urine albumin test to assess for hypertension-related complications.      Relevant Orders   Microalbumin / creatinine urine ratio   AMB Referral [facility] Care Management     Endocrine   Diabetes mellitus, type 2 (HCC)   Chronic  Well controlled   Glucose level of 124 mg/dL in the ED, consistent with  prediabetes. Hemoglobin A1c was 5.8% in January, indicating good control. Discussed dietary modifications to prevent progression to diabetes, including reducing intake of fried foods and opting for healthier alternatives like grilled chicken and fruit cups. - Encourage dietary modifications to maintain glucose control and prevent progression to diabetes.      Relevant Orders   Microalbumin / creatinine urine ratio   AMB Referral [facility] Care Management     Other   Memory changes   Memory loss, progressing  Memory concerns with potential vascular dementia due to hypertension. Symptoms include forgetting appointments and difficulty keeping track of days. Discussed the importance of social interaction and mental stimulation to slow cognitive decline. Consideration of vascular changes as a contributing factor to memory loss. Isolation and lack of social interaction may contribute to cognitive decline. - Refer to value-based care institute for memory  resources and support. - Encourage participation in social activities and mental exercises to stimulate cognitive function. - would like to complete MOCA at next OV       Relevant Orders   AMB Referral [facility] Care Management     Assessment & Plan     Return in about 2 months (around 08/10/2023) for memory, HTN .       Mimi Alt, MD  [facility] [phone] (phone) [phone] (fax)  [facility]

## 2023-06-11 LAB — SPECIMEN STATUS REPORT

## 2023-06-11 LAB — MICROALBUMIN / CREATININE URINE RATIO
Creatinine, Urine: 306.2 mg/dL
Microalb/Creat Ratio: 3 mg/g{creat} (ref 0–29)
Microalbumin, Urine: 9.5 ug/mL

## 2023-06-12 ENCOUNTER — Ambulatory Visit: Payer: Self-pay | Admitting: Family Medicine

## 2023-06-12 ENCOUNTER — Telehealth: Payer: Self-pay

## 2023-06-12 DIAGNOSIS — E1122 Type 2 diabetes mellitus with diabetic chronic kidney disease: Secondary | ICD-10-CM | POA: Diagnosis not present

## 2023-06-12 DIAGNOSIS — M1712 Unilateral primary osteoarthritis, left knee: Secondary | ICD-10-CM | POA: Diagnosis not present

## 2023-06-12 DIAGNOSIS — H6122 Impacted cerumen, left ear: Secondary | ICD-10-CM | POA: Diagnosis not present

## 2023-06-12 DIAGNOSIS — E039 Hypothyroidism, unspecified: Secondary | ICD-10-CM | POA: Diagnosis not present

## 2023-06-12 DIAGNOSIS — Z9181 History of falling: Secondary | ICD-10-CM | POA: Diagnosis not present

## 2023-06-12 DIAGNOSIS — Z602 Problems related to living alone: Secondary | ICD-10-CM | POA: Diagnosis not present

## 2023-06-12 DIAGNOSIS — E785 Hyperlipidemia, unspecified: Secondary | ICD-10-CM | POA: Diagnosis not present

## 2023-06-12 DIAGNOSIS — I10 Essential (primary) hypertension: Secondary | ICD-10-CM | POA: Diagnosis not present

## 2023-06-12 DIAGNOSIS — N181 Chronic kidney disease, stage 1: Secondary | ICD-10-CM | POA: Diagnosis not present

## 2023-06-12 DIAGNOSIS — M19049 Primary osteoarthritis, unspecified hand: Secondary | ICD-10-CM | POA: Diagnosis not present

## 2023-06-12 DIAGNOSIS — Z604 Social exclusion and rejection: Secondary | ICD-10-CM | POA: Diagnosis not present

## 2023-06-12 DIAGNOSIS — E1151 Type 2 diabetes mellitus with diabetic peripheral angiopathy without gangrene: Secondary | ICD-10-CM | POA: Diagnosis not present

## 2023-06-12 DIAGNOSIS — Z96653 Presence of artificial knee joint, bilateral: Secondary | ICD-10-CM | POA: Diagnosis not present

## 2023-06-12 DIAGNOSIS — J309 Allergic rhinitis, unspecified: Secondary | ICD-10-CM | POA: Diagnosis not present

## 2023-06-12 NOTE — Progress Notes (Signed)
 Complex Care Management Note  Care Guide Note 06/12/2023 Name: Cynthia Castillo MRN: [id number] DOB: 1940-07-03  Cynthia Castillo is a 83 y.o. year old female who sees Simmons-Robinson, Judyann Number, MD for primary care. I reached out to Daryel Ensign by phone today to offer complex care management services.  Ms. Schlotzhauer was given information about Complex Care Management services today including:   The Complex Care Management services include support from the care team which includes your Nurse Care Manager, Clinical Social Worker, or Pharmacist.  The Complex Care Management team is here to help remove barriers to the health concerns and goals most important to you. Complex Care Management services are voluntary, and the patient may decline or stop services at any time by request to their care team member.   Complex Care Management Consent Status: Patient agreed to services and verbal consent obtained.   Follow up plan:  Telephone appointment with complex care management team member scheduled for:  07/03/2023  Encounter Outcome:  Patient Scheduled  Lenton Rail , RMA     [place]  [facility] Guide  Direct Dial: [phone]  Website: [email or web address]

## 2023-06-12 NOTE — Progress Notes (Signed)
 Care Guide Pharmacy Note  06/12/2023 Name: Cynthia Castillo MRN: [id number] DOB: December 15, 1940  Referred By: Mimi Alt, MD Reason for referral: Complex Care Management (Outreach to schedule with LCSW and Pharm d )   Cynthia Castillo is a 83 y.o. year old female who is a primary care patient of Simmons-Robinson, Judyann Number, MD.  Daryel Ensign was referred to the pharmacist for assistance related to: HLD and DMII  Successful contact was made with the patient to discuss pharmacy services including being ready for the pharmacist to call at least 5 minutes before the scheduled appointment time and to have medication bottles and any blood pressure readings ready for review. The patient agreed to meet with the pharmacist via telephone visit on (date/time).06/26/2023  Lenton Rail , RMA     [place]  [facility] Guide  Direct Dial: [phone]  Website: [email or web address]

## 2023-06-16 ENCOUNTER — Telehealth: Payer: Self-pay

## 2023-06-16 DIAGNOSIS — Z789 Other specified health status: Secondary | ICD-10-CM

## 2023-06-16 NOTE — Telephone Encounter (Signed)
 Copied from CRM [id number]. Topic: General - Other >> Jun 16, 2023 11:00 AM Sophia H wrote: Reason for CRM: Spoke with patients daughter Jullie Oiler who states she has some concerns about her mother. While she was in for her appointment last week she (daughter) does not believe her mother was honest with Dr. Verdia Glad. Would like to speak with Dr. Verdia Glad directly if possible, best contact # [phone] Jullie Oiler

## 2023-06-17 ENCOUNTER — Telehealth: Payer: Self-pay | Admitting: Family Medicine

## 2023-06-17 NOTE — Telephone Encounter (Signed)
 Fax received from [employer] for certification placed in provider mailbox to complete

## 2023-06-18 DIAGNOSIS — M19049 Primary osteoarthritis, unspecified hand: Secondary | ICD-10-CM | POA: Diagnosis not present

## 2023-06-18 DIAGNOSIS — H6122 Impacted cerumen, left ear: Secondary | ICD-10-CM | POA: Diagnosis not present

## 2023-06-18 DIAGNOSIS — I10 Essential (primary) hypertension: Secondary | ICD-10-CM | POA: Diagnosis not present

## 2023-06-18 DIAGNOSIS — Z96653 Presence of artificial knee joint, bilateral: Secondary | ICD-10-CM | POA: Diagnosis not present

## 2023-06-18 DIAGNOSIS — E039 Hypothyroidism, unspecified: Secondary | ICD-10-CM | POA: Diagnosis not present

## 2023-06-18 DIAGNOSIS — N181 Chronic kidney disease, stage 1: Secondary | ICD-10-CM | POA: Diagnosis not present

## 2023-06-18 DIAGNOSIS — E1151 Type 2 diabetes mellitus with diabetic peripheral angiopathy without gangrene: Secondary | ICD-10-CM | POA: Diagnosis not present

## 2023-06-18 DIAGNOSIS — J309 Allergic rhinitis, unspecified: Secondary | ICD-10-CM | POA: Diagnosis not present

## 2023-06-18 DIAGNOSIS — Z9181 History of falling: Secondary | ICD-10-CM | POA: Diagnosis not present

## 2023-06-18 DIAGNOSIS — E785 Hyperlipidemia, unspecified: Secondary | ICD-10-CM | POA: Diagnosis not present

## 2023-06-18 DIAGNOSIS — E1122 Type 2 diabetes mellitus with diabetic chronic kidney disease: Secondary | ICD-10-CM | POA: Diagnosis not present

## 2023-06-18 DIAGNOSIS — M1712 Unilateral primary osteoarthritis, left knee: Secondary | ICD-10-CM | POA: Diagnosis not present

## 2023-06-19 ENCOUNTER — Telehealth: Payer: Self-pay

## 2023-06-19 DIAGNOSIS — M19049 Primary osteoarthritis, unspecified hand: Secondary | ICD-10-CM | POA: Diagnosis not present

## 2023-06-19 DIAGNOSIS — E785 Hyperlipidemia, unspecified: Secondary | ICD-10-CM | POA: Diagnosis not present

## 2023-06-19 DIAGNOSIS — I10 Essential (primary) hypertension: Secondary | ICD-10-CM | POA: Diagnosis not present

## 2023-06-19 DIAGNOSIS — Z604 Social exclusion and rejection: Secondary | ICD-10-CM | POA: Diagnosis not present

## 2023-06-19 DIAGNOSIS — Z602 Problems related to living alone: Secondary | ICD-10-CM | POA: Diagnosis not present

## 2023-06-19 DIAGNOSIS — E039 Hypothyroidism, unspecified: Secondary | ICD-10-CM | POA: Diagnosis not present

## 2023-06-19 DIAGNOSIS — E1122 Type 2 diabetes mellitus with diabetic chronic kidney disease: Secondary | ICD-10-CM | POA: Diagnosis not present

## 2023-06-19 DIAGNOSIS — M1712 Unilateral primary osteoarthritis, left knee: Secondary | ICD-10-CM | POA: Diagnosis not present

## 2023-06-19 DIAGNOSIS — N181 Chronic kidney disease, stage 1: Secondary | ICD-10-CM | POA: Diagnosis not present

## 2023-06-19 DIAGNOSIS — E1151 Type 2 diabetes mellitus with diabetic peripheral angiopathy without gangrene: Secondary | ICD-10-CM | POA: Diagnosis not present

## 2023-06-19 DIAGNOSIS — Z96653 Presence of artificial knee joint, bilateral: Secondary | ICD-10-CM | POA: Diagnosis not present

## 2023-06-19 DIAGNOSIS — Z9181 History of falling: Secondary | ICD-10-CM | POA: Diagnosis not present

## 2023-06-19 DIAGNOSIS — J309 Allergic rhinitis, unspecified: Secondary | ICD-10-CM | POA: Diagnosis not present

## 2023-06-19 DIAGNOSIS — H6122 Impacted cerumen, left ear: Secondary | ICD-10-CM | POA: Diagnosis not present

## 2023-06-19 NOTE — Telephone Encounter (Signed)
 Pt daughter advised. She verbalized understanding and reports she will discuss with her mother

## 2023-06-19 NOTE — Telephone Encounter (Signed)
 Copied from CRM [id number]. Topic: General - Other >> Jun 19, 2023 11:23 AM Felizardo Hotter wrote: Reason for CRM: Received call from [facility] per Antony Baumgartner ph: [phone] reported pt blood pressure is 170/74 but did not take blood pressure medication but will this morning. Please call [phone] pt at your earliest convenience.

## 2023-06-22 ENCOUNTER — Ambulatory Visit: Payer: Self-pay | Admitting: Family Medicine

## 2023-06-22 DIAGNOSIS — Z602 Problems related to living alone: Secondary | ICD-10-CM | POA: Diagnosis not present

## 2023-06-22 DIAGNOSIS — H6122 Impacted cerumen, left ear: Secondary | ICD-10-CM | POA: Diagnosis not present

## 2023-06-22 DIAGNOSIS — Z96653 Presence of artificial knee joint, bilateral: Secondary | ICD-10-CM | POA: Diagnosis not present

## 2023-06-22 DIAGNOSIS — N181 Chronic kidney disease, stage 1: Secondary | ICD-10-CM | POA: Diagnosis not present

## 2023-06-22 DIAGNOSIS — E785 Hyperlipidemia, unspecified: Secondary | ICD-10-CM | POA: Diagnosis not present

## 2023-06-22 DIAGNOSIS — E039 Hypothyroidism, unspecified: Secondary | ICD-10-CM | POA: Diagnosis not present

## 2023-06-22 DIAGNOSIS — E1151 Type 2 diabetes mellitus with diabetic peripheral angiopathy without gangrene: Secondary | ICD-10-CM | POA: Diagnosis not present

## 2023-06-22 DIAGNOSIS — I10 Essential (primary) hypertension: Secondary | ICD-10-CM | POA: Diagnosis not present

## 2023-06-22 DIAGNOSIS — M1712 Unilateral primary osteoarthritis, left knee: Secondary | ICD-10-CM | POA: Diagnosis not present

## 2023-06-22 DIAGNOSIS — Z604 Social exclusion and rejection: Secondary | ICD-10-CM | POA: Diagnosis not present

## 2023-06-22 DIAGNOSIS — J309 Allergic rhinitis, unspecified: Secondary | ICD-10-CM | POA: Diagnosis not present

## 2023-06-22 DIAGNOSIS — E1122 Type 2 diabetes mellitus with diabetic chronic kidney disease: Secondary | ICD-10-CM | POA: Diagnosis not present

## 2023-06-22 DIAGNOSIS — M19049 Primary osteoarthritis, unspecified hand: Secondary | ICD-10-CM | POA: Diagnosis not present

## 2023-06-22 DIAGNOSIS — Z9181 History of falling: Secondary | ICD-10-CM | POA: Diagnosis not present

## 2023-06-23 ENCOUNTER — Telehealth: Payer: Self-pay

## 2023-06-23 DIAGNOSIS — Z79899 Other long term (current) drug therapy: Secondary | ICD-10-CM

## 2023-06-23 NOTE — Telephone Encounter (Signed)
 Agree with previously documented conversation.   I have referred pt to [facility] with request for LCSW to assist with possible personal aid to assist with bathing.   Recommend continued home health visits

## 2023-06-23 NOTE — Progress Notes (Signed)
   06/23/2023  Patient ID: Cynthia Castillo, female   DOB: 01/21/1940, 83 y.o.   MRN: [id number]  I called patient regarding appointment on 06/26/2023.   Alexandria Angel, PharmD Clinical Pharmacist Cell: [phone]

## 2023-06-23 NOTE — Progress Notes (Signed)
   06/23/2023  Patient ID: Cynthia Castillo, female   DOB: 29-Dec-1940, 83 y.o.   MRN: [id number]    I received a call back from patient's daughter to reschedule appointment next Tuesday. Briefly discussed basis of appointment, adherence packaging process, and asked that all of Ms. Vanoverbeke's medications be present to review. Her daughter also stated that the patient doesn't answer calls.   Alexandria Angel, PharmD Clinical Pharmacist Cell: [phone]

## 2023-06-23 NOTE — Addendum Note (Signed)
 Addended by: SIMMONS-ROBINSON, Betania Dizon L on: 06/23/2023 10:08 AM   Modules accepted: Orders

## 2023-06-26 ENCOUNTER — Other Ambulatory Visit

## 2023-06-29 ENCOUNTER — Ambulatory Visit: Admitting: Podiatry

## 2023-06-29 DIAGNOSIS — B351 Tinea unguium: Secondary | ICD-10-CM | POA: Diagnosis not present

## 2023-06-29 DIAGNOSIS — N181 Chronic kidney disease, stage 1: Secondary | ICD-10-CM

## 2023-06-29 DIAGNOSIS — M79675 Pain in left toe(s): Secondary | ICD-10-CM

## 2023-06-29 DIAGNOSIS — M79674 Pain in right toe(s): Secondary | ICD-10-CM | POA: Diagnosis not present

## 2023-06-29 DIAGNOSIS — E1122 Type 2 diabetes mellitus with diabetic chronic kidney disease: Secondary | ICD-10-CM | POA: Diagnosis not present

## 2023-06-30 ENCOUNTER — Other Ambulatory Visit: Payer: Self-pay

## 2023-06-30 ENCOUNTER — Other Ambulatory Visit (HOSPITAL_COMMUNITY): Payer: Self-pay

## 2023-06-30 DIAGNOSIS — Z79899 Other long term (current) drug therapy: Secondary | ICD-10-CM

## 2023-06-30 DIAGNOSIS — I10 Essential (primary) hypertension: Secondary | ICD-10-CM

## 2023-06-30 MED ORDER — LOSARTAN POTASSIUM-HCTZ 100-25 MG PO TABS
1.0000 | ORAL_TABLET | Freq: Every day | ORAL | 2 refills | Status: AC
  Filled 2023-06-30: qty 90, 90d supply, fill #0

## 2023-06-30 MED ORDER — AMLODIPINE BESYLATE 5 MG PO TABS
5.0000 mg | ORAL_TABLET | Freq: Every day | ORAL | 2 refills | Status: AC
  Filled 2023-06-30: qty 90, 90d supply, fill #0

## 2023-06-30 NOTE — Progress Notes (Signed)
 06/30/2023 Name: Cynthia Castillo MRN: [id number] DOB: 1940/08/23  Chief Complaint  Patient presents with   Medication Adherence    Cynthia Castillo is a 83 y.o. year old female who presented for a telephone visit.   They were referred to the pharmacist by their PCP for assistance in managing medication adherence.    Subjective:  Care Team: Primary Care Provider: Mimi Alt, MD ; Next Scheduled Visit: 08/10/2023   Medication Access/Adherence  Current Pharmacy:  [facility] [address] [place] - [address] [place] [address] Phone: [phone] Fax: [phone]  [facility] [phone] [place], [place] - [address] [facility] [place] [address] Phone: [phone] Fax: [phone]  [facility] [address] ([address]), [place] - [address] [place] ([address]) [place] [address] Phone: [phone] Fax: [phone]   Patient reports affordability concerns with their medications: No  Patient reports access/transportation concerns to their pharmacy: No  Patient reports adherence concerns with their medications:  Yes     Medication Management:  Current adherence strategy: pillbox, refills on Sundays  - She does not use an alarm and she does not have a smart phone  Patient reports poor adherence to medications  Patient reports the following barriers to adherence: memory, distractions ("excitements")  Recent fill dates:  Medication Date Filled/Sold Quantity Medication Date Filled/Sold Quantity  Losartan - HCTZ 100-25 mg  06/03/2023 90 Amlodipine  5 mg 06/03/2023 90  Losartan - HCTZ 100-25 mg  08/01/2022 90 Amlodipine  5 mg 09/01/2022 90       Amlodipine  5 mg 07/18/2022 90         Objective:  Lab Results  Component Value Date   HGBA1C 5.8 (H) 02/17/2023    Lab Results  Component Value Date   CREATININE 1.21 (H) 06/03/2023   BUN 15 06/03/2023   NA 139 06/03/2023    K 4.1 06/03/2023   CL 109 06/03/2023   CO2 22 06/03/2023    Lab Results  Component Value Date   CHOL 198 09/13/2020   HDL 60 09/13/2020   LDLCALC 120 (H) 09/13/2020   TRIG 103 09/13/2020   CHOLHDL 3.3 09/13/2020    Medications Reviewed Today     Reviewed by Amedeo Bailiff, RPH (Pharmacist) on 06/30/23 at 1508  Med List Status: <None>   Medication Order Taking? Sig Documenting Provider Last Dose Status Informant  amLODipine  (NORVASC ) 5 MG tablet [id number] Yes Take 1 tablet (5 mg total) by mouth daily. Simmons-Robinson, Makiera, MD Taking Active   ketoconazole  (NIZORAL ) 2 % cream [id number] Yes Apply to both feet and between toes once daily for 6 weeks. Luella Sager, DPM Taking Active   losartan -hydrochlorothiazide  (HYZAAR) 100-25 MG tablet [id number] Yes Take 1 tablet by mouth daily. Simmons-Robinson, Makiera, MD Taking Active   Multiple Vitamins-Minerals (CENTRUM SILVER 50+WOMEN PO) [id number] Yes Take by mouth daily.  [provider] Taking Active               Assessment/Plan:   Medication Management: - Currently strategy insufficient to maintain appropriate adherence to prescribed medication regimen - Suggested use of adherence packaging to organize medications as the dates are listed on the package - Suggested pill alarm reminder from [facility] that includes the date and time since the patient does not have a smart phone  - Discussed collaboration with local pharmacies for adherence packaging. Reviewed local pharmacies with adherence packaging options. Patient elects to try [facility] - Will collaborate with PCP  to send medications to [facility]. - Contact Dylan Brunette for compliance packaging. Patient elects to get medication delivered.     Follow Up Plan: 2 weeks (telephone) with pharmacist  Alexandria Angel, PharmD Clinical Pharmacist Cell: [phone]

## 2023-07-01 ENCOUNTER — Other Ambulatory Visit (HOSPITAL_COMMUNITY): Payer: Self-pay

## 2023-07-01 ENCOUNTER — Telehealth: Payer: Self-pay

## 2023-07-01 NOTE — Telephone Encounter (Signed)
Ok for verbal orders.    Andreya Lacks Simmons-Robinson, MD  [facility]  

## 2023-07-01 NOTE — Telephone Encounter (Signed)
 Copied from CRM [id number]. Topic: Clinical - Medical Advice >> Jun 30, 2023  4:18 PM Everette C wrote: Reason for CRM: Oakley Bellman with [facility] has called to request verbal orders for a Child psychotherapist for the patient to assist with aiding in use of community resources   Please contact Tasha further when possible

## 2023-07-02 ENCOUNTER — Other Ambulatory Visit: Payer: Self-pay

## 2023-07-03 ENCOUNTER — Other Ambulatory Visit: Payer: Self-pay | Admitting: *Deleted

## 2023-07-03 NOTE — Patient Outreach (Signed)
 Patient contacted today for scheduled appointment to discuss in home care needs. Per patient, she is not in need of in home care at this time and states that she has her daughter and sister that provides support. Patient provided with this social worker's contact information in the event that any needs arise in the future. CSW will follow up within the  4-6 weeks    Hilbert Briggs, LCSW [facility]  [facility] Licensed Clinical Social Worker  Direct Dial: [phone]

## 2023-07-03 NOTE — Patient Instructions (Addendum)
 Visit Information  Thank you for taking time to visit with me today. Please don't hesitate to contact me if I can be of assistance to you before our next scheduled appointment.  Our next appointment is by telephone on 07/31/23 at 1:00pm for follow up on community resource needs Please call the care guide team at [phone] if you need to cancel or reschedule your appointment.     Please call 911 if you are experiencing a Mental Health or Behavioral Health Crisis or need someone to talk to.    Kirsi Hugh, LCSW [place]  [facility], [facility] Licensed Clinical Social Worker  Direct Dial: [phone]

## 2023-07-05 ENCOUNTER — Encounter: Payer: Self-pay | Admitting: Podiatry

## 2023-07-05 NOTE — Progress Notes (Signed)
  Subjective:  Patient ID: Cynthia Castillo, female    DOB: Jan 20, 1941,  MRN: [id number]  Cynthia Castillo presents to clinic today for preventative diabetic foot care and painful elongated mycotic toenails 1-5 bilaterally which are tender when wearing enclosed shoe gear. Pain is relieved with periodic professional debridement. She is accompanied by her daughter on today's visit. Chief Complaint  Patient presents with   Nail Problem    Thick painful toenails, 3 month follow up   New problem(s): None.   PCP is Simmons-Robinson, Judyann Number, MD. Cynthia Castillo 06/10/2023.  Allergies  Allergen Reactions   Latex Anaphylaxis    Review of Systems: Negative except as noted in the HPI.  Objective: No changes noted in today's physical examination. There were no vitals filed for this visit. Cynthia Castillo is a pleasant 84 y.o. female WD, WN in NAD. AAO x 3.  Vascular Examination: Capillary refill time immediate b/l. Palpable pedal pulses. Pedal hair absent b/l. No pain with calf compression b/l. Skin temperature gradient WNL b/l. No cyanosis or clubbing b/l. No ischemia or gangrene noted b/l. Trace edema noted BLE.  Neurological Examination: Sensation grossly intact b/l with 10 gram monofilament. Vibratory sensation intact b/l.   Dermatological Examination: Pedal skin with normal turgor, texture and tone b/l.  No open wounds. No interdigital macerations.   Toenails 1-5 b/l thick, discolored, elongated with subungual debris and pain on dorsal palpation.   No corns, calluses nor porokeratotic lesions noted.  Musculoskeletal Examination: Muscle strength 5/5 to all lower extremity muscle groups bilaterally.  Radiographs: None  Last A1c:      Latest Ref Rng & Units 02/17/2023    2:27 PM  Hemoglobin A1C  Hemoglobin-A1c 4.8 - 5.6 % 5.8     Assessment/Plan: 1. Pain due to onychomycosis of toenails of both feet   2. Type 2 diabetes mellitus with stage 1 chronic kidney disease, without long-term current  use of insulin  (HCC)   -Patient's family member present. All questions/concerns addressed on today's visit. -Consent given for treatment as described below: -Examined patient. -Patient to continue soft, supportive shoe gear daily. -Patient was encouraged to apply moisturzier to feet once daily avoiding application between toes. -Patient/POA to call should there be question/concern in the interim.   Return in about 3 months (around 09/29/2023).  Cynthia Castillo, DPM      [place] LOCATION: [address], [place] [address]                   Office [phone]   [facility] LOCATION: [address] [place], [place] [address] Office [phone]

## 2023-07-14 ENCOUNTER — Other Ambulatory Visit: Payer: Self-pay

## 2023-07-14 NOTE — Progress Notes (Signed)
   07/14/2023  Patient ID: Cynthia Castillo, female   DOB: June 29, 1940, 83 y.o.   MRN: [id number]  Brief Summary:  I called and spoke with patient who stated that she has not been contacted regarding compliance packaging. She continues to remain interested in services. Will contact pharmacy technician via Teams, Dylan, to patient's status of compliance packaging process. Patient does need to refill any medications at this time.   Patient has not purchased pill reminder, but states that she plans to soon. Encouraged patient to get pill reminder.   Dorcas Solian, PharmD Clinical Pharmacist Cell: [phone]

## 2023-07-15 ENCOUNTER — Other Ambulatory Visit: Payer: Self-pay

## 2023-07-29 ENCOUNTER — Other Ambulatory Visit: Payer: Self-pay

## 2023-07-29 NOTE — Progress Notes (Signed)
 07/29/2023 Name: Cynthia Castillo MRN: [id number] DOB: 1940/08/09  Chief Complaint  Patient presents with   Medication Assistance    Cynthia Castillo is a 83 y.o. year old female who presented for a telephone visit.   They were referred to the pharmacist by their PCP for assistance in managing complex medication management.    Subjective:  Care Team: Primary Care Provider: Sharma Coyer, MD ; Next Scheduled Visit: 08/10/2023   Medication Access/Adherence  Current Pharmacy:  [facility] [address], [place] - [address] [place] [address] Phone: [phone] Fax: [phone]  [facility] [phone] [place] - [address] [facility] [place] [address] Phone: [phone] Fax: [phone]  [facility] [address] ([address]), [place] - [address] [place] ([address]) [place] [address] Phone: [phone] Fax: [phone]  [facility] [address] [place] [address] Phone: [phone] Fax: [phone]  I spoke with Cynthia Castillo, CPhT regarding compliance packaging and patient does not qualify for services as she not have enough medications (three or more prescribed prescriptions). Cynthia Castillo was informed of this requirement.  The patient was contacted today to follow up on the compliance package and to check whether she has obtained a pill reminder as previously discussed. Cynthia Castillo has not yet obtained a pill reminder but stated that she still plans to do so. She denied cost being a barrier; however, due to her location, driving distance to stores such as [facility], [facility], or [facility] is a challenge, as these stores are all located in [place], approximately 25 miles away.  We discussed the option of checking her local [employer] or grocery store for a pillbox or pill alarm. Alternative suggestions included using a clock or kitchen  timer as a reminder. The patient was agreeable to these suggestions.  She mentioned that her daughter lives in [place], but she believes her sister may be able to help with filling the pillbox. She will need to speak with her sister about this support.      Objective:  Lab Results  Component Value Date   HGBA1C 5.8 (H) 02/17/2023    Lab Results  Component Value Date   CREATININE 1.21 (H) 06/03/2023   BUN 15 06/03/2023   NA 139 06/03/2023   K 4.1 06/03/2023   CL 109 06/03/2023   CO2 22 06/03/2023    Lab Results  Component Value Date   CHOL 198 09/13/2020   HDL 60 09/13/2020   LDLCALC 120 (H) 09/13/2020   TRIG 103 09/13/2020   CHOLHDL 3.3 09/13/2020    Medications Reviewed Today     Reviewed by Cleatus Cynthia SAUNDERS, RPH (Pharmacist) on 07/29/23 at 1101  Med List Status: <None>   Medication Order Taking? Sig Documenting Provider Last Dose Status Informant  amLODipine  (NORVASC ) 5 MG tablet [id number] Yes Take 1 tablet (5 mg total) by mouth daily. Simmons-Robinson, Makiera, MD  Active   ketoconazole  (NIZORAL ) 2 % cream [id number]  Apply to both feet and between toes once daily for 6 weeks.  Patient not taking: Reported on 07/29/2023   Cynthia Castillo, DPM  Active   losartan -hydrochlorothiazide  (HYZAAR ) 100-25 MG tablet [id number] Yes Take 1 tablet by mouth daily. Simmons-Robinson, Makiera, MD  Active   Multiple Vitamins-Minerals (CENTRUM SILVER 50+WOMEN PO) [id number] Yes Take by mouth daily.  [provider]  Active  Assessment/Plan:   Medication Management: - Currently strategy insufficient to maintain appropriate adherence to prescribed medication regimen. She does not have a smart phone to setup an alarm to notify of medication administration. Additionally, separating Hyzaar  to qualify for compliance packaging,could increase pill burden and cost for patient; will avoid at this time.  - Suggested use of weekly pill box to organize  medications - Will created list of medication, indication, and administration time. Provided to patient during next appointment.  - Will have patient fill pillbox during next appointment with education on how to fill pillbox and setup an alarm (should patient bring device).      Follow Up Plan: 08/10/2023 with PharmD (in person) following appointment with PCP  Cynthia Castillo, PharmD Clinical Pharmacist Cell: [phone]

## 2023-07-31 ENCOUNTER — Telehealth: Payer: Self-pay | Admitting: *Deleted

## 2023-07-31 ENCOUNTER — Encounter: Payer: Self-pay | Admitting: *Deleted

## 2023-07-31 NOTE — Patient Outreach (Incomplete)
 Phone call to patient to follow up on community resource needs. Voicemail was full as was not able to leave a questions. Return call from patient's daughter, on [place]

## 2023-08-07 ENCOUNTER — Telehealth: Payer: Self-pay

## 2023-08-07 NOTE — Progress Notes (Signed)
   08/07/2023  Patient ID: Cynthia Castillo, female   DOB: 1940/01/27, 83 y.o.   MRN: [id number]  Patient Appointment Rescheduling and Follow-Up I attempted to call the patient regarding her rescheduled appointment; however, I was unable to reach her. The appointment had been rescheduled by her daughter due to a conflict--her daughter needed to attend another appointment in [place] at 3:00 PM. Today, I spoke directly with the patient's daughter, Cynthia Castillo, to clarify a misunderstanding. I explained that the patient's original appointment on Monday was scheduled immediately after her primary care visit with Dr. Sharma, as this timing was intentional. The purpose was to allow me to: Provide medication education Demonstrate how to fill a pillbox, since the patient is not eligible for medication compliance packaging Set up a reminder alarm to assist the patient in remembering to take her medications  Ms. Stokes agreed that it would be fine to reschedule the appointment back to Monday, July 21st at 3:00 PM. I called [facility] and confirmed that space will be available for me to see the patient at that time.  I also discussed with Ms. Castillo the items I asked her mother to bring to the appointment: A pillbox (one should have been mailed, but if not, one can be purchased from [employer] or [employer]). Will help patient learn names of each medication to educate on how she can fill pillbox.  A reminder device, such as a kitchen timer, alarm clock, or any alarm, so I can assist with setting up a regular medication reminder system   Thanks,  Dorcas Solian, PharmD Clinical Pharmacist Cell: [phone]

## 2023-08-10 ENCOUNTER — Ambulatory Visit: Admitting: Family Medicine

## 2023-08-10 ENCOUNTER — Telehealth: Payer: Self-pay

## 2023-08-10 ENCOUNTER — Ambulatory Visit

## 2023-08-10 NOTE — Progress Notes (Signed)
   08/10/2023  Patient ID: [name], female   DOB: 04/12/1940, 83 y.o.   MRN: [id number]  Phone Call from Patient's Daughter - Suzen Razor  Suzen Razor, daughter of Ms. Doffing, called to inform that Ms. Wessells's appointment with her Primary Care Provider has been canceled/rescheduled. Ms. Renier has received her pillbox; however, she did not purchase the medication alarm. Mrs. Razor reported that her mother is adamant about not forgetting to take her medications and noted that Ms. Laverdure is very compliant with her medication regimen, especially when she is present with her mother.  Given Ms. Lansdowne does not have an alarm informed daughter there is no need for her to drive 3 hrs from her home to take the patient to see pharmacist. Mrs. Razor will assess for utility of alarm to remind her mother to take her medication; she did state her aunt who lives in [place] may offer assistance.   I called patient to reschedule appointment but was unable to reach; her voicemail was full and I could not leave a message.   Will follow up in 1-2 weeks to follow compliance and utilization of pillbox.  Dorcas Solian, PharmD Clinical Pharmacist Cell: [phone]

## 2023-08-12 ENCOUNTER — Ambulatory Visit

## 2023-08-15 ENCOUNTER — Other Ambulatory Visit: Payer: Self-pay

## 2023-08-15 ENCOUNTER — Emergency Department: Admission: EM | Admit: 2023-08-15 | Discharge: 2023-08-15 | Disposition: A | Discharge status: Home / Self Care

## 2023-08-15 DIAGNOSIS — E119 Type 2 diabetes mellitus without complications: Secondary | ICD-10-CM | POA: Insufficient documentation

## 2023-08-15 DIAGNOSIS — K5641 Fecal impaction: Secondary | ICD-10-CM | POA: Insufficient documentation

## 2023-08-15 DIAGNOSIS — K5900 Constipation, unspecified: Secondary | ICD-10-CM

## 2023-08-15 MED ORDER — ACETAMINOPHEN 500 MG PO TABS
1000.0000 mg | ORAL_TABLET | Freq: Once | ORAL | Status: AC
  Administered 2023-08-15: 1000 mg via ORAL
  Filled 2023-08-15: qty 2

## 2023-08-15 MED ORDER — POLYETHYLENE GLYCOL 3350 17 GM/SCOOP PO POWD: 17.0000 g | Freq: Every day | ORAL | 0 refills | Status: AC | PRN

## 2023-08-15 MED ORDER — DOCUSATE SODIUM 100 MG PO CAPS: 100.0000 mg | ORAL_CAPSULE | Freq: Every day | ORAL | 0 refills | Status: AC | PRN

## 2023-08-15 NOTE — ED Notes (Signed)
 Patient declined discharge vital signs.

## 2023-08-15 NOTE — ED Triage Notes (Signed)
 Pt to ED POV with daughter for twisting like feeling to rectum since 8 days ago. Pt unsure if swelling. States may be hemorrhoids. Denies rectal bleeding. Intermittent pain.

## 2023-08-15 NOTE — ED Notes (Signed)
 Patient tolerated enema well. Patient had a large, brown bowel movement. Dr. Clarine aware.

## 2023-08-15 NOTE — Discharge Instructions (Signed)
 Your evaluation in the emergency department was notable for a large ball of stool in your rectum.  This was removed like finger and with an enema.  I started you on laxatives to use as needed for any ongoing difficulty with bowel movements.  Please do follow-up with your primary care provider for reevaluation, and return to the emergency department with any new or worsening symptoms.

## 2023-08-15 NOTE — ED Provider Notes (Signed)
 [facility] Provider Note    Event Date/Time   First MD Initiated Contact with Patient 08/15/23 1258     (approximate)   History   Hemorrhoids  Pt to ED POV with daughter for twisting like feeling to rectum since 8 days ago. Pt unsure if swelling. States may be hemorrhoids. Denies rectal bleeding. Intermittent pain.    HPI Cynthia Castillo is a 83 y.o. female PMH diabetes, hyperlipidemia, PAD presents for evaluation of rectal discomfort - Patient has reportedly been having rectal discomfort for about 1 week.  Has noted some constipation over the past 3-4 days, notes she tried to go to the bathroom but nothing comes out.  Denies any abdominal pain.  No fevers.  No bloody stools.      Physical Exam   Triage Vital Signs: ED Triage Vitals  Encounter Vitals Group     BP 08/15/23 1252 (!) 113/59     Girls Systolic BP Percentile --      Girls Diastolic BP Percentile --      Boys Systolic BP Percentile --      Boys Diastolic BP Percentile --      Pulse Rate 08/15/23 1252 93     Resp 08/15/23 1252 20     Temp 08/15/23 1252 98.4 F (36.9 C)     Temp Source 08/15/23 1252 Oral     SpO2 08/15/23 1252 98 %     Weight 08/15/23 1251 156 lb (70.8 kg)     Height 08/15/23 1251 5' 1 (1.549 m)     Head Circumference --      Peak Flow --      Pain Score 08/15/23 1250 0     Pain Loc --      Pain Education --      Exclude from Growth Chart --     Most recent vital signs: Vitals:   08/15/23 1252  BP: (!) 113/59  Pulse: 93  Resp: 20  Temp: 98.4 F (36.9 C)  SpO2: 98%     General: Awake, no distress.  CV:  Good peripheral perfusion.  Resp:  Normal effort.  Abd:  No distention. Nontender to deep palpation throughout Rectal: No obvious hemorrhoids nor anal fissure appreciated, no rectal masses though patient does have large firm stool ball in distal rectal vault.  Did not tolerate attempts at manual disimpaction well.   ED Results / Procedures /  Treatments   Labs (all labs ordered are listed, but only abnormal results are displayed) Labs Reviewed - No data to display   EKG  N/a   RADIOLOGY N/a    PROCEDURES:  Critical Care performed: No  .Fecal disimpaction  Date/Time: 08/15/2023 1:26 PM  Performed by: Clarine Ozell LABOR, MD Authorized by: Clarine Ozell LABOR, MD  Consent: Verbal consent obtained Consent given by: patient Patient understanding: patient states understanding of the procedure being performed Patient identity confirmed: verbally with patient and arm band Local anesthesia used: no  Anesthesia: Local anesthesia used: no  Sedation: Patient sedated: no  Comments: Large firm stool ball in rectal vault.  Mild amount of stool disimpacted though procedure terminated at patient request.      MEDICATIONS ORDERED IN ED: Medications  acetaminophen  (TYLENOL ) tablet 1,000 mg (1,000 mg Oral Given 08/15/23 1330)     IMPRESSION / MDM / ASSESSMENT AND PLAN / ED COURSE  I reviewed the triage vital signs and the nursing notes.  DDX/MDM/AP: Differential diagnosis includes, but is not limited to, discomfort secondary to fecal impaction in rectum, no evidence of hemorrhoid or fissure on my initial eval.  Benign abdominal exam, doubt acute intra-abdominal pathology.  Tolerated only minimal fecal disimpaction due to discomfort, will give Tylenol  and plan for soapsuds enema.  Plan: - Manual disimpaction attempted, only mild stool removal - Tylenol  - Soapsuds enema - Reeval, no indication for lab/imaging on initial evaluation  Patient's presentation is most consistent with acute, uncomplicated illness.    ED course below.   Clinical Course as of 08/15/23 1436  Sat Aug 15, 2023  1434 Patient with large bowel movement here in emergency department after enema.  Discomfort has resolved, feeling much better.  Amenable to discharge home with stool softeners.  Plan for PMD follow-up.  ED  return precautions in place.  Patient agrees with plan. [MM]    Clinical Course User Index [MM] Clarine Ozell LABOR, MD     FINAL CLINICAL IMPRESSION(S) / ED DIAGNOSES   Final diagnoses:  Fecal impaction in rectum (HCC)  Constipation, unspecified constipation type     Rx / DC Orders   ED Discharge Orders          Ordered    docusate sodium  (COLACE) 100 MG capsule  Daily PRN        08/15/23 1435    polyethylene glycol powder (GLYCOLAX /MIRALAX ) 17 GM/SCOOP powder  Daily PRN        08/15/23 1435             Note:  This document was prepared using Dragon voice recognition software and may include unintentional dictation errors.   Clarine Ozell LABOR, MD 08/15/23 [phone]

## 2023-08-26 ENCOUNTER — Telehealth: Payer: Self-pay

## 2023-08-26 NOTE — Progress Notes (Signed)
   08/26/2023  Patient ID: Cynthia Castillo, female   DOB: [birth year], 83 y.o.   MRN: [id number]  I called patient for an update but was unable to reach; her voicemail was full and I could not leave a message. Will follow up next week for final attempt. Per last discussion, patient's daughter believes her mother to be compliant with her medications and patient is adamant she is adherent to medication. Can likely close referral for compliance packing.    Dorcas Solian, PharmD Clinical Pharmacist Cell: [phone]

## 2023-09-23 ENCOUNTER — Encounter: Payer: Self-pay | Admitting: Family Medicine

## 2023-09-23 ENCOUNTER — Ambulatory Visit (INDEPENDENT_AMBULATORY_CARE_PROVIDER_SITE_OTHER): Admitting: Family Medicine

## 2023-09-23 ENCOUNTER — Ambulatory Visit: Payer: Medicare HMO

## 2023-09-23 VITALS — BP 138/60 | HR 79 | Resp 16 | Ht 61.0 in | Wt 153.8 lb

## 2023-09-23 DIAGNOSIS — Z0000 Encounter for general adult medical examination without abnormal findings: Secondary | ICD-10-CM

## 2023-09-23 DIAGNOSIS — R413 Other amnesia: Secondary | ICD-10-CM | POA: Diagnosis not present

## 2023-09-23 DIAGNOSIS — E1122 Type 2 diabetes mellitus with diabetic chronic kidney disease: Secondary | ICD-10-CM

## 2023-09-23 DIAGNOSIS — I10 Essential (primary) hypertension: Secondary | ICD-10-CM | POA: Diagnosis not present

## 2023-09-23 DIAGNOSIS — N181 Chronic kidney disease, stage 1: Secondary | ICD-10-CM | POA: Diagnosis not present

## 2023-09-23 LAB — POCT GLYCOSYLATED HEMOGLOBIN (HGB A1C): Hemoglobin A1C: 6.6 % — AB (ref 4.0–5.6)

## 2023-09-23 NOTE — Patient Instructions (Signed)
 Ms. Cynthia Castillo , Thank you for taking time out of your busy schedule to complete your Annual Wellness Visit with me. I enjoyed our conversation and look forward to speaking with you again next year. I, as well as your care team,  appreciate your ongoing commitment to your health goals. Please review the following plan we discussed and let me know if I can assist you in the future.   Follow up Visits: 09/28/24 @ 11:30 AM BY PHONE We will see or speak with you next year for your Next Medicare AWV with our clinical staff Have you seen your provider in the last 6 months (3 months if uncontrolled diabetes)? Yes  Clinician Recommendations:  Aim for 30 minutes of exercise or brisk walking, 6-8 glasses of water, and 5 servings of fruits and vegetables each day. TAKE CARE!      This is a list of the screenings recommended for you:  Health Maintenance  Topic Date Due   COVID-19 Vaccine (1) Never done   Zoster (Shingles) Vaccine (1 of 2) 11/24/1960   Eye exam for diabetics  05/02/2022   DTaP/Tdap/Td vaccine (3 - Td or Tdap) 10/21/2022   Hemoglobin A1C  08/17/2023   Flu Shot  08/21/2023   Complete foot exam   03/22/2024   Yearly kidney function blood test for diabetes  06/02/2024   Yearly kidney health urinalysis for diabetes  06/09/2024   Medicare Annual Wellness Visit  09/22/2024   Pneumococcal Vaccine for age over 57  Completed   DEXA scan (bone density measurement)  Completed   HPV Vaccine  Aged Out   Meningitis B Vaccine  Aged Out    Advanced directives: (ACP Link)Information on Advanced Care Planning can be found at IT trainer Health Care Directives Advance Health Care Directives. [email or web address]/  Advance Care Planning is important because it:  [x]  Makes sure you receive the medical care that is consistent with your values, goals, and preferences  [x]  It provides guidance to your family and loved ones and reduces their decisional burden about whether or not they are  making the right decisions based on your wishes.  Follow the link provided in your after visit summary or read over the paperwork we have mailed to you to help you started getting your Advance Directives in place. If you need assistance in completing these, please reach out to us  so that we can help you!

## 2023-09-23 NOTE — Progress Notes (Signed)
 Subjective:   Cynthia Castillo is a 83 y.o. who presents for a Medicare Wellness preventive visit.  As a reminder, Annual Wellness Visits don't include a physical exam, and some assessments may be limited, especially if this visit is performed virtually. We may recommend an in-person follow-up visit with your provider if needed.  Visit Complete: Virtual I connected with  [name] on 09/23/23 by a audio enabled telemedicine application and verified that I am speaking with the correct person using two identifiers.  Patient Location: Home  Provider Location: Home Office  I discussed the limitations of evaluation and management by telemedicine. The patient expressed understanding and agreed to proceed.  Vital Signs: Because this visit was a virtual/telehealth visit, some criteria may be missing or patient reported. Any vitals not documented were not able to be obtained and vitals that have been documented are patient reported.  VideoDeclined- This patient declined Librarian, academic. Therefore the visit was completed with audio only.  Persons Participating in Visit: Patient.  AWV Questionnaire: No: Patient Medicare AWV questionnaire was not completed prior to this visit.  Cardiac Risk Factors include: advanced age (>51men, >66 women);diabetes mellitus;dyslipidemia;hypertension     Objective:    There were no vitals filed for this visit. There is no height or weight on file to calculate BMI.     09/23/2023    1:21 PM 08/15/2023   12:51 PM 09/17/2022    1:33 PM 09/16/2021    1:30 PM 01/03/2019    3:00 PM 12/30/2017    3:09 PM 12/23/2016    1:04 PM  Advanced Directives  Does Patient Have a Medical Advance Directive? No No Yes No Yes No  No   Type of Advance Directive   Living will;Healthcare Power of Attorney  Living will    Would patient like information on creating a medical advance directive? No - Patient declined   No - Patient declined  No -  Patient declined  Yes (MAU/Ambulatory/Procedural Areas - Information given)      Data saved with a previous flowsheet row definition    Current Medications (verified) Outpatient Encounter Medications as of 09/23/2023  Medication Sig   amLODipine  (NORVASC ) 5 MG tablet Take 1 tablet (5 mg total) by mouth daily.   ketoconazole  (NIZORAL ) 2 % cream Apply to both feet and between toes once daily for 6 weeks.   losartan -hydrochlorothiazide  (HYZAAR ) 100-25 MG tablet Take 1 tablet by mouth daily.   Multiple Vitamins-Minerals (CENTRUM SILVER 50+WOMEN PO) Take by mouth daily.    polyethylene glycol powder (GLYCOLAX /MIRALAX ) 17 GM/SCOOP powder Take 17 g by mouth daily as needed for moderate constipation.   No facility-administered encounter medications on file as of 09/23/2023.    Allergies (verified) Latex   History: Past Medical History:  Diagnosis Date   Arthritis    hands   Complication of anesthesia    Diabetes mellitus without complication (HCC) 2015   diet control   Hypertension    Murmur    PONV (postoperative nausea and vomiting)    Past Surgical History:  Procedure Laterality Date   ABDOMINAL HYSTERECTOMY  1972   APPENDECTOMY     BREAST CYST EXCISION Left 1972   COLONOSCOPY  2007   Dr Dessa   COLONOSCOPY WITH PROPOFOL  N/A 03/21/2015   Procedure: COLONOSCOPY WITH PROPOFOL ;  Surgeon: Reyes LELON Dessa, MD;  Location: [facility] END[facility]COPY;  Service: Endoscopy;  Laterality: N/A;   REPLACEMENT TOTAL KNEE BILATERAL     TOTAL KNEE ARTHROPLASTY  Right 07/25/2014   Procedure: TOTAL KNEE ARTHROPLASTY;  Surgeon: Ozell Flake, MD;  Location: [facility] ORS;  Service: Orthopedics;  Laterality: Right;   TOTAL KNEE ARTHROPLASTY Left 06/19/2015   Procedure: TOTAL KNEE ARTHROPLASTY;  Surgeon: Ozell Flake, MD;  Location: [facility] ORS;  Service: Orthopedics;  Laterality: Left;   Family History  Problem Relation Age of Onset   Stroke Mother    Heart attack Father    Cancer Sister 40       stomach/Shirley    Cancer Sister 49       breast/Rosa Graves   Breast cancer Sister    Cancer Maternal Grandmother        unknown   Social History   Socioeconomic History   Marital status: Widowed    Spouse name: Not on file   Number of children: 1   Years of education: Not on file   Highest education level: Some college, no degree  Occupational History   Occupation: retired  Tobacco Use   Smoking status: Never   Smokeless tobacco: Never  Vaping Use   Vaping status: Never Used  Substance and Sexual Activity   Alcohol use: No    Alcohol/week: 0.0 standard drinks of alcohol   Drug use: No   Sexual activity: Never  Other Topics Concern   Not on file  Social History Narrative   Not on file   Social Drivers of Health   Financial Resource Strain: Low Risk  (09/23/2023)   Overall Financial Resource Strain (CARDIA)    Difficulty of Paying Living Expenses: Not hard at all  Food Insecurity: No Food Insecurity (09/23/2023)   Hunger Vital Sign    Worried About Running Out of Food in the Last Year: Never true    Ran Out of Food in the Last Year: Never true  Transportation Needs: No Transportation Needs (09/23/2023)   PRAPARE - Administrator, Civil Service (Medical): No    Lack of Transportation (Non-Medical): No  Physical Activity: Insufficiently Active (09/23/2023)   Exercise Vital Sign    Days of Exercise per Week: 7 days    Minutes of Exercise per Session: 20 min  Stress: No Stress Concern Present (09/23/2023)   [employer] of Occupational Health - Occupational Stress Questionnaire    Feeling of Stress: Not at all  Social Connections: Moderately Isolated (09/23/2023)   Social Connection and Isolation Panel    Frequency of Communication with Friends and Family: Twice a week    Frequency of Social Gatherings with Friends and Family: Once a week    Attends Religious Services: More than 4 times per year    Active Member of [employer] or Organizations: No    Attends Banker  Meetings: Never    Marital Status: Widowed    Tobacco Counseling Counseling given: Not Answered    Clinical Intake:  Pre-visit preparation completed: Yes  Pain : No/denies pain     BMI - recorded: 29.3 Nutritional Status: BMI 25 -29 Overweight Nutritional Risks: None Diabetes: Yes CBG done?: No Did pt. bring in CBG monitor from home?: No  Lab Results  Component Value Date   HGBA1C 5.8 (H) 02/17/2023   HGBA1C 6.0 (H) 05/23/2022   HGBA1C 6.3 (H) 02/11/2022     How often do you need to have someone help you when you read instructions, pamphlets, or other written materials from your doctor or pharmacy?: 1 - Never  Interpreter Needed?: No  Information entered by :: JHONNIE DAS, LPN  Activities of Daily Living     09/23/2023    1:23 PM  In your present state of health, do you have any difficulty performing the following activities:  Hearing? 0  Vision? 0  Difficulty concentrating or making decisions? 1  Walking or climbing stairs? 0  Dressing or bathing? 0  Doing errands, shopping? 0  Preparing Food and eating ? N  Using the Toilet? N  In the past six months, have you accidently leaked urine? N  Do you have problems with loss of bowel control? N  Managing your Medications? N  Managing your Finances? N  Housekeeping or managing your Housekeeping? N    Patient Care Team: Sharma Coyer, MD as PCP - General (Family Medicine) Dessa, Reyes ORN, MD (General Surgery) Mittie Gaskin, MD as Referring Physician (Ophthalmology) Enola Feliciano Hugger, MD as Referring Physician (Ophthalmology) Pa, [facility] (Optometry) Pa, [facility] (Optometry)  I have updated your Care Teams any recent Medical Services you may have received from other providers in the past year.     Assessment:   This is a routine wellness examination for Bobie.  Hearing/Vision screen Hearing Screening - Comments:: NO AIDS Vision Screening - Comments:: NO  GLASSES- [facility]- LAST YEAR HAD EXAM   Goals Addressed             This Visit's Progress    DIET - REDUCE SUGAR INTAKE         Depression Screen     09/23/2023    1:19 PM 02/17/2023    1:43 PM 09/17/2022    1:30 PM 08/01/2022    2:45 PM 07/18/2022    2:35 PM 06/25/2022    1:15 PM 05/23/2022    3:21 PM  PHQ 2/9 Scores  PHQ - 2 Score 4 0 0 0 0 1 0  PHQ- 9 Score 6 2   2 4 1     Fall Risk     09/23/2023    1:23 PM 02/17/2023    1:43 PM 09/17/2022    1:25 PM 08/01/2022    2:45 PM 06/25/2022    1:15 PM  Fall Risk   Falls in the past year? 0 0 0 1 0  Number falls in past yr: 0 0 0 0 0  Injury with Fall? 0 0 0 0 0  Risk for fall due to : No Fall Risks  Impaired mobility;Impaired balance/gait History of fall(s) Medication side effect  Follow up Falls evaluation completed;Falls prevention discussed  Education provided;Falls prevention discussed Falls evaluation completed Falls evaluation completed    MEDICARE RISK AT HOME:  Medicare Risk at Home Any stairs in or around the home?: Yes If so, are there any without handrails?: No Home free of loose throw rugs in walkways, pet beds, electrical cords, etc?: Yes Adequate lighting in your home to reduce risk of falls?: Yes Life alert?: No Use of a cane, walker or w/c?: Yes (WALKER EVERY DAY) Grab bars in the bathroom?: Yes Shower chair or bench in shower?: Yes Elevated toilet seat or a handicapped toilet?: Yes  TIMED UP AND GO:  Was the test performed?  No  Cognitive Function: 6CIT completed    04/10/2021   11:32 AM  MMSE - Mini Mental State Exam  Orientation to time 5  Orientation to Place 5  Registration 3  Attention/ Calculation 5  Recall 3  Language- name 2 objects 2  Language- repeat 1  Language- follow 3 step command 3  Language- read &  follow direction 1  Write a sentence 1  Copy design 1  Total score 30        09/23/2023    1:24 PM 09/17/2022    1:39 PM 12/30/2017    3:14 PM 12/05/2015    3:11 PM  6CIT Screen   What Year? 0 points 0 points 0 points 0 points  What month? 0 points 0 points 0 points 0 points  What time? 3 points 0 points 0 points 0 points  Count back from 20 4 points 0 points 0 points 0 points  Months in reverse 4 points 0 points 2 points 2 points  Repeat phrase 4 points 0 points 0 points 4 points  Total Score 15 points 0 points 2 points 6 points    Immunizations Immunization History  Administered Date(s) Administered   Fluad Quad(high Dose 65+) 01/03/2019, 02/17/2022   INFLUENZA, HIGH DOSE SEASONAL PF 10/23/2014, 09/17/2015, 12/23/2016, 12/30/2017   Pneumococcal Conjugate-13 08/16/2013   Pneumococcal Polysaccharide-23 11/20/2010   Td 08/25/2003   Tdap 10/20/2012   Zoster, Live 10/20/2012    Screening Tests Health Maintenance  Topic Date Due   COVID-19 Vaccine (1) Never done   Zoster Vaccines- Shingrix (1 of 2) 11/24/1960   OPHTHALMOLOGY EXAM  05/02/2022   DTaP/Tdap/Td (3 - Td or Tdap) 10/21/2022   HEMOGLOBIN A1C  08/17/2023   INFLUENZA VACCINE  08/21/2023   FOOT EXAM  03/22/2024   Diabetic kidney evaluation - eGFR measurement  06/02/2024   Diabetic kidney evaluation - Urine ACR  06/09/2024   Medicare Annual Wellness (AWV)  09/22/2024   Pneumococcal Vaccine: 50+ Years  Completed   DEXA SCAN  Completed   HPV VACCINES  Aged Out   Meningococcal B Vaccine  Aged Out    Health Maintenance  Health Maintenance Due  Topic Date Due   COVID-19 Vaccine (1) Never done   Zoster Vaccines- Shingrix (1 of 2) 11/24/1960   OPHTHALMOLOGY EXAM  05/02/2022   DTaP/Tdap/Td (3 - Td or Tdap) 10/21/2022   HEMOGLOBIN A1C  08/17/2023   INFLUENZA VACCINE  08/21/2023   Health Maintenance Items Addressed: NEEDS TDAP, SHINGRIX, COVID- AGED OUT OF MAMMOGRAM & COLONOSCOPY; DEXA DECLINED   Additional Screening:  Vision Screening: Recommended annual ophthalmology exams for early detection of glaucoma and other disorders of the eye. Would you like a referral to an eye doctor? No     Dental Screening: Recommended annual dental exams for proper oral hygiene  Community Resource Referral / Chronic Care Management: CRR required this visit?  No   CCM required this visit?  No   Plan:    I have personally reviewed and noted the following in the patient's chart:   Medical and social history Use of alcohol, tobacco or illicit drugs  Current medications and supplements including opioid prescriptions. Patient is not currently taking opioid prescriptions. Functional ability and status Nutritional status Physical activity Advanced directives List of other physicians Hospitalizations, surgeries, and ER visits in previous 12 months Vitals Screenings to include cognitive, depression, and falls Referrals and appointments  In addition, I have reviewed and discussed with patient certain preventive protocols, quality metrics, and best practice recommendations. A written personalized care plan for preventive services as well as general preventive health recommendations were provided to patient.   Jhonnie GORMAN Das, LPN   0/06/7972   After Visit Summary: (MyChart) Due to this being a telephonic visit, the after visit summary with patients personalized plan was offered to patient via MyChart   Notes: Nothing  significant to report at this time.

## 2023-09-23 NOTE — Progress Notes (Signed)
 Established patient visit   Patient: Cynthia Castillo   DOB: 26-Oct-1940   83 y.o. Female  MRN: [id number] Visit Date: 09/23/2023  Today's healthcare provider: Rockie Agent, MD   Chief Complaint  Patient presents with   Medical Management of Chronic Issues    HTN and Memory Eye Exam not up to date. Here with daughter Cynthia Castillo Flu Vaccine: Patient declined today.   Subjective     HPI     Medical Management of Chronic Issues    Additional comments: HTN and Memory Eye Exam not up to date. Here with daughter Cynthia Castillo Flu Vaccine: Patient declined today.      Last edited by Rosas, Joseline E, CMA on 09/23/2023  4:03 PM.       Discussed the use of AI scribe software for clinical note transcription with the patient, who gave verbal consent to proceed.  History of Present Illness Cynthia Castillo is an 83 year old female who presents with concerns about memory changes.  She experiences memory changes, including difficulty remembering small details and appointments. She recalls a recent wellness visit over the phone where she was asked memory-related questions, which she was able to answer. However, she sometimes forgets appointments, such as hair appointments, and has mixed up dates in the past. Her daughter expresses concern about her memory, noting both short-term and long-term memory issues that have been ongoing for a while. There is no family history of Alzheimer's or Parkinson's disease.  She has chronic hypertension, managed with amlodipine  5 mg daily, losartan  100 mg daily, and hydrochlorothiazide  25 mg daily. Despite well-controlled blood pressure, she experiences weakness, dizziness, and lightheadedness in the mornings, particularly if she gets up late and hasn't eaten. She sometimes has headaches upon waking. Although she has a way to check her blood pressure, she hasn't done so in the last two days.  Her daughter notes a history of peripheral  arterial disease. She does not use the oven often and relies on the microwave.  She mentions a preference for [employer] and sweet tea, which she acknowledges may not be beneficial for her health. She prefers to be left alone and dislikes having people in her house, such as nurses, which causes her confusion and distress.     Past Medical History:  Diagnosis Date   Arthritis    hands   Complication of anesthesia    Diabetes mellitus without complication (HCC) 2015   diet control   Hypertension    Murmur    PONV (postoperative nausea and vomiting)     Medications: Outpatient Medications Prior to Visit  Medication Sig   amLODipine  (NORVASC ) 5 MG tablet Take 1 tablet (5 mg total) by mouth daily.   ketoconazole  (NIZORAL ) 2 % cream Apply to both feet and between toes once daily for 6 weeks.   losartan -hydrochlorothiazide  (HYZAAR ) 100-25 MG tablet Take 1 tablet by mouth daily.   Multiple Vitamins-Minerals (CENTRUM SILVER 50+WOMEN PO) Take by mouth daily.    polyethylene glycol powder (GLYCOLAX /MIRALAX ) 17 GM/SCOOP powder Take 17 g by mouth daily as needed for moderate constipation.   No facility-administered medications prior to visit.    Review of Systems      Objective    BP (!) 155/79 (BP Location: Left Arm, Patient Position: Sitting, Cuff Size: Normal)   Pulse 79   Resp 16   Ht 5' 1 (1.549 m)   Wt 153 lb 12.8 oz (69.8 kg)   SpO2 99%  BMI 29.06 kg/m      Physical Exam Vitals reviewed.  Constitutional:      General: She is not in acute distress.    Appearance: Normal appearance. She is not ill-appearing.  Cardiovascular:     Rate and Rhythm: Normal rate and regular rhythm.  Pulmonary:     Effort: Pulmonary effort is normal. No respiratory distress.     Breath sounds: No wheezing, rhonchi or rales.  Neurological:     Mental Status: She is alert and oriented to person, place, and time.  Psychiatric:        Mood and Affect: Mood normal.        Behavior:  Behavior normal.       No results found for any visits on 09/23/23.  Assessment & Plan     Problem List Items Addressed This Visit   None   Assessment and Plan Assessment & Plan Mild cognitive impairment Mild cognitive impairment with memory changes, including difficulty remembering appointments and mixing up dates. Mini mental status exam score is 30, within normal range. Differential includes vascular dementia due to hypertension and peripheral arterial disease. No family history of Alzheimer's or Parkinson's disease. Thyroid  function is normal, and kidney function is stable. Mini Cog testing score was 3 (incorrect clock) - Refer to neurology for evaluation of potential vascular dementia. - Encourage cognitive-stimulating activities, such as puzzles and social interactions. - Monitor for safety concerns at home, such as leaving the stove on. - Check A1c to assess blood sugar control.  Essential hypertension Chronic hypertension, well-controlled with current medication regimen. Emphasized maintaining blood pressure control to reduce risk of vascular dementia. - Continue amlodipine  5 mg daily. - Continue losartan  100 mg daily. - Continue hydrochlorothiazide  25 mg daily.  Type 2 diabetes mellitus, Chronic condition  Type 2 diabetes mellitus with recent A1c of 6.6, indicating good control. No significant dietary changes, but enjoys [employer] and sweet tea. Emphasized maintaining good glycemic control to prevent complications. - Check A1c to ensure continued good control. - Encourage moderation in consumption of sweet tea and high-calorie foods.  Chronic kidney disease Chronic kidney disease with well-managed kidney function. Previous elevation in kidney numbers noted in January, but stable since May.     No follow-ups on file.         Rockie Agent, MD  [facility] [phone] (phone) [phone] (fax)  [facility]

## 2023-09-25 ENCOUNTER — Ambulatory Visit: Admitting: Family Medicine

## 2023-09-25 ENCOUNTER — Ambulatory Visit: Payer: Self-pay | Admitting: Family Medicine

## 2023-09-29 ENCOUNTER — Telehealth: Payer: Self-pay | Admitting: *Deleted

## 2023-09-29 NOTE — Progress Notes (Unsigned)
 Complex Care Management Care Guide Note  09/29/2023 Name: Cynthia Castillo MRN: [id number] DOB: 03-17-1940  Cynthia Castillo is a 83 y.o. year old female who is a primary care patient of Simmons-Robinson, Makiera, MD and is actively engaged with the care management team. I reached out to Cynthia Castillo by phone today to assist with scheduling  with the Licensed Clinical Social Worker.  Follow up plan: Unsuccessful telephone outreach attempt made. A HIPAA compliant phone message was left for the patient providing contact information and requesting a return call.  Thedford Franks, CMA, Care Guide [facility]  [facility], [facility] Guide Direct Dial: [phone]  Fax: [phone] Website: [email or web address]

## 2023-09-30 NOTE — Progress Notes (Signed)
 Complex Care Management Care Guide Note  09/30/2023 Name: LASHONDA SONNEBORN MRN: [id number] DOB: November 20, 1940  Gracelyn SHAUNNA Dlugosz is a 83 y.o. year old female who is a primary care patient of Simmons-Robinson, Makiera, MD and is actively engaged with the care management team. I reached out to [name] by phone today to assist with re-scheduling  with the Licensed Clinical Child psychotherapist.  Follow up plan: Unsuccessful telephone outreach attempt made. A HIPAA compliant phone message was left for the patient providing contact information and requesting a return call. No further outreach attempts will be made due to inability to maintain patient contact.   Thedford Franks, CMA [place]  [facility], [facility] Guide Direct Dial: [phone]  Fax: [phone] Website: [email or web address]

## 2023-10-05 ENCOUNTER — Ambulatory Visit: Admitting: Podiatry

## 2023-10-05 ENCOUNTER — Encounter: Payer: Self-pay | Admitting: Podiatry

## 2023-10-05 DIAGNOSIS — M79674 Pain in right toe(s): Secondary | ICD-10-CM

## 2023-10-05 DIAGNOSIS — M79675 Pain in left toe(s): Secondary | ICD-10-CM | POA: Diagnosis not present

## 2023-10-05 DIAGNOSIS — N181 Chronic kidney disease, stage 1: Secondary | ICD-10-CM

## 2023-10-05 DIAGNOSIS — L853 Xerosis cutis: Secondary | ICD-10-CM

## 2023-10-05 DIAGNOSIS — B351 Tinea unguium: Secondary | ICD-10-CM | POA: Diagnosis not present

## 2023-10-05 DIAGNOSIS — E1122 Type 2 diabetes mellitus with diabetic chronic kidney disease: Secondary | ICD-10-CM | POA: Diagnosis not present

## 2023-10-08 NOTE — Progress Notes (Signed)
  Subjective:  Patient ID: Cynthia Castillo, female    DOB: 11-01-1940,  MRN: [id number]  Cynthia Castillo presents to clinic today for preventative diabetic foot care for painful mycotic toenails x 10 which interfere with daily activities. Pain is relieved with periodic professional debridement. She is accompanied by her daughter on today's visit. Patient states she has not been applying moisturizing lotion to feet.  Chief Complaint  Patient presents with   [facility]    Rm2 Diabetic foot care/ Dr. Rockie Marcine Essex last visit September 3,2025/ A1c 6.6   New problem(s): None.   PCP is Simmons-Robinson, Makiera, MD.  Allergies  Allergen Reactions   Latex Anaphylaxis    Review of Systems: Negative except as noted in the HPI.  Objective: No changes noted in today's physical examination. There were no vitals filed for this visit. Cynthia Castillo is a pleasant 83 y.o. female in NAD. AAO x 3.  Vascular Examination: Capillary refill time immediate b/l. Palpable pedal pulses. Pedal hair absent b/l. No pain with calf compression b/l. Skin temperature gradient WNL b/l. No cyanosis or clubbing b/l. No ischemia or gangrene noted b/l. Trace edema noted BLE.  Neurological Examination: Sensation grossly intact b/l with 10 gram monofilament. Vibratory sensation intact b/l.   Dermatological Examination: Pedal skin noted to be dry b/l lower extremities.  No open wounds. No interdigital macerations.   Toenails 1-5 b/l thick, discolored, elongated with subungual debris and pain on dorsal palpation.   No corns, calluses nor porokeratotic lesions noted.  Musculoskeletal Examination: Muscle strength 5/5 to all lower extremity muscle groups bilaterally.  Radiographs: None  Assessment/Plan: 1. Pain due to onychomycosis of toenails of both feet   2. Xerosis cutis   3. Type 2 diabetes mellitus with stage 1 chronic kidney disease, without long-term current use of insulin  [facility])   Consent given for  treatment. Patient examined. All patient's and/or POA's questions/concerns addressed on today's visit. Encouraged patient to apply moisturizer to feet daily. Toenails 1-5 debrided in length and girth without incident. Continue foot and shoe inspections daily. Monitor blood glucose per PCP/Endocrinologist's recommendations. Continue soft, supportive shoe gear daily. Report any pedal injuries to medical professional. Call office if there are any questions/concerns.  Return in about 3 months (around 01/04/2024).  Delon LITTIE Merlin, DPM      [place] LOCATION: [address] [place] [address]                   Office [phone]   [facility] LOCATION: [address] [place], [place] [address] Office [phone]

## 2023-11-30 DIAGNOSIS — E538 Deficiency of other specified B group vitamins: Secondary | ICD-10-CM | POA: Diagnosis not present

## 2023-11-30 DIAGNOSIS — F339 Major depressive disorder, recurrent, unspecified: Secondary | ICD-10-CM | POA: Diagnosis not present

## 2023-11-30 DIAGNOSIS — Z1331 Encounter for screening for depression: Secondary | ICD-10-CM | POA: Diagnosis not present

## 2023-11-30 DIAGNOSIS — R2689 Other abnormalities of gait and mobility: Secondary | ICD-10-CM | POA: Diagnosis not present

## 2023-11-30 DIAGNOSIS — R413 Other amnesia: Secondary | ICD-10-CM | POA: Diagnosis not present

## 2023-11-30 DIAGNOSIS — R29898 Other symptoms and signs involving the musculoskeletal system: Secondary | ICD-10-CM | POA: Diagnosis not present

## 2023-12-04 DIAGNOSIS — Z1331 Encounter for screening for depression: Secondary | ICD-10-CM | POA: Diagnosis not present

## 2023-12-04 DIAGNOSIS — E538 Deficiency of other specified B group vitamins: Secondary | ICD-10-CM | POA: Diagnosis not present

## 2023-12-04 DIAGNOSIS — R413 Other amnesia: Secondary | ICD-10-CM | POA: Diagnosis not present

## 2023-12-07 ENCOUNTER — Other Ambulatory Visit: Payer: Self-pay | Admitting: Physician Assistant

## 2023-12-07 DIAGNOSIS — R413 Other amnesia: Secondary | ICD-10-CM

## 2023-12-07 DIAGNOSIS — R2689 Other abnormalities of gait and mobility: Secondary | ICD-10-CM

## 2023-12-07 DIAGNOSIS — R29898 Other symptoms and signs involving the musculoskeletal system: Secondary | ICD-10-CM

## 2023-12-23 ENCOUNTER — Ambulatory Visit: Admitting: Family Medicine

## 2023-12-26 ENCOUNTER — Ambulatory Visit

## 2023-12-28 ENCOUNTER — Ambulatory Visit: Admitting: Family Medicine

## 2023-12-29 ENCOUNTER — Ambulatory Visit: Payer: Self-pay

## 2023-12-29 NOTE — Telephone Encounter (Signed)
 FYI Only or Action Required?: Action required by provider: update on patient condition. Reach out to Daughter Work # to reschedule FU  Patient was last seen in primary care on 09/23/2023 by Sharma Coyer, MD.  Called Nurse Triage reporting Back Pain.  Symptoms began Unsure.  Interventions attempted: Other: Unsure.  Symptoms are: gradually worsening.  Triage Disposition: No disposition on file.  Patient/caregiver understands and will follow disposition?:   Copied from CRM [id number]. Topic: Clinical - Red Word Triage >> Dec 29, 2023  3:26 PM Joesph NOVAK wrote: Red Word that prompted transfer to Nurse Triage: severe back pain- daughter calling Answer Assessment - Initial Assessment Questions 1. REASON FOR CALL: What is the main reason for your call? or How can I best help you?     Had to cancel FU 12/8 due to the weather- daughter is calling to reschedule. Daughter drives 3 hours to take her to appts and has another appt on 1/19 and looking to see if Dr Marcine Essex could squeeze them in that day.  Patient does have some worsened chronic back pain. Daughter states she missed church this weekend bc of it. She does not know much else bc she has a hard time getting the pt to answer the phone. She will reach out for more info.   Please call daughter to set up FU.  Protocols used: Information Only Call - No Triage-A-AH

## 2023-12-30 NOTE — Telephone Encounter (Signed)
 Per DPR lvm for Suzen (daughter) asking for a callback.  E2C2 please offer appointment if patient would like to be seen, does not currently have Ov scheduled in January  (may use same day or newborn slot)

## 2023-12-30 NOTE — Telephone Encounter (Signed)
 Appt scheduled for patient

## 2024-01-04 ENCOUNTER — Ambulatory Visit: Admitting: Podiatry

## 2024-01-05 ENCOUNTER — Ambulatory Visit: Admitting: Family Medicine

## 2024-02-20 ENCOUNTER — Ambulatory Visit

## 2024-03-07 ENCOUNTER — Ambulatory Visit: Admitting: Podiatry

## 2024-04-28 ENCOUNTER — Other Ambulatory Visit

## 2024-09-28 ENCOUNTER — Ambulatory Visit
